# Patient Record
Sex: Male | Born: 1950 | Race: White | Hispanic: No | Marital: Married | Smoking: Former smoker
Health system: Southern US, Community
[De-identification: ages and names within clinical notes are randomized; demographics above are authoritative.]

## PROBLEM LIST (undated history)

## (undated) DIAGNOSIS — J45909 Unspecified asthma, uncomplicated: Secondary | ICD-10-CM

## (undated) DIAGNOSIS — N419 Inflammatory disease of prostate, unspecified: Secondary | ICD-10-CM

## (undated) DIAGNOSIS — Z889 Allergy status to unspecified drugs, medicaments and biological substances status: Secondary | ICD-10-CM

## (undated) DIAGNOSIS — E785 Hyperlipidemia, unspecified: Secondary | ICD-10-CM

## (undated) DIAGNOSIS — N400 Enlarged prostate without lower urinary tract symptoms: Secondary | ICD-10-CM

## (undated) DIAGNOSIS — K219 Gastro-esophageal reflux disease without esophagitis: Secondary | ICD-10-CM

## (undated) DIAGNOSIS — M1990 Unspecified osteoarthritis, unspecified site: Secondary | ICD-10-CM

## (undated) DIAGNOSIS — I10 Essential (primary) hypertension: Secondary | ICD-10-CM

## (undated) DIAGNOSIS — I2510 Atherosclerotic heart disease of native coronary artery without angina pectoris: Secondary | ICD-10-CM

## (undated) HISTORY — PX: COLONOSCOPY: SHX174

## (undated) HISTORY — PX: CORONARY STENT PLACEMENT: SHX1402

## (undated) HISTORY — PX: JOINT REPLACEMENT: SHX530

## (undated) HISTORY — PX: APPENDECTOMY: SHX54

## (undated) HISTORY — PX: WISDOM TOOTH EXTRACTION: SHX21

---

## 2011-08-03 ENCOUNTER — Other Ambulatory Visit: Payer: Self-pay | Admitting: Orthopedic Surgery

## 2011-08-03 ENCOUNTER — Ambulatory Visit (HOSPITAL_COMMUNITY)
Admission: RE | Admit: 2011-08-03 | Discharge: 2011-08-03 | Disposition: A | Discharge status: Home / Self Care | Payer: No Typology Code available for payment source | Source: Ambulatory Visit | Attending: Orthopedic Surgery | Admitting: Orthopedic Surgery

## 2011-08-03 ENCOUNTER — Other Ambulatory Visit (HOSPITAL_COMMUNITY): Payer: Self-pay | Admitting: Orthopedic Surgery

## 2011-08-03 ENCOUNTER — Encounter (HOSPITAL_COMMUNITY): Payer: No Typology Code available for payment source

## 2011-08-03 DIAGNOSIS — Z01818 Encounter for other preprocedural examination: Secondary | ICD-10-CM

## 2011-08-03 DIAGNOSIS — Z01812 Encounter for preprocedural laboratory examination: Secondary | ICD-10-CM | POA: Insufficient documentation

## 2011-08-03 DIAGNOSIS — Z01810 Encounter for preprocedural cardiovascular examination: Secondary | ICD-10-CM | POA: Insufficient documentation

## 2011-08-03 DIAGNOSIS — M87059 Idiopathic aseptic necrosis of unspecified femur: Secondary | ICD-10-CM | POA: Insufficient documentation

## 2011-08-03 DIAGNOSIS — I10 Essential (primary) hypertension: Secondary | ICD-10-CM | POA: Insufficient documentation

## 2011-08-03 DIAGNOSIS — Z01811 Encounter for preprocedural respiratory examination: Secondary | ICD-10-CM | POA: Insufficient documentation

## 2011-08-03 LAB — BASIC METABOLIC PANEL
CO2: 27 mEq/L (ref 19–32)
Calcium: 10.1 mg/dL (ref 8.4–10.5)
Creatinine, Ser: 0.99 mg/dL (ref 0.50–1.35)

## 2011-08-03 LAB — DIFFERENTIAL
Lymphocytes Relative: 34 % (ref 12–46)
Lymphs Abs: 1.3 10*3/uL (ref 0.7–4.0)
Neutrophils Relative %: 54 % (ref 43–77)

## 2011-08-03 LAB — URINE MICROSCOPIC-ADD ON

## 2011-08-03 LAB — CBC
Hemoglobin: 14.1 g/dL (ref 13.0–17.0)
Platelets: 206 10*3/uL (ref 150–400)
RBC: 4.25 MIL/uL (ref 4.22–5.81)
WBC: 3.9 10*3/uL — ABNORMAL LOW (ref 4.0–10.5)

## 2011-08-03 LAB — SURGICAL PCR SCREEN
MRSA, PCR: NEGATIVE
Staphylococcus aureus: NEGATIVE

## 2011-08-03 LAB — URINALYSIS, ROUTINE W REFLEX MICROSCOPIC
Glucose, UA: NEGATIVE mg/dL
Specific Gravity, Urine: 1.019 (ref 1.005–1.030)
pH: 5 (ref 5.0–8.0)

## 2011-08-06 NOTE — H&P (Signed)
Sean Schultz, Sean Schultz              ACCOUNT NO.:  [id number]  MEDICAL RECORD NO.:  [id number]  LOCATION:                                 FACILITY:  PHYSICIAN:  Madlyn Frankel. Charlann Boxer, M.D.  DATE OF BIRTH:  11/25/1950  DATE OF ADMISSION:  08/14/2011 DATE OF DISCHARGE:                             HISTORY & PHYSICAL   DATE OF SURGERY:  August 14, 2011.  ADMISSION DIAGNOSIS:  Avascular necrosis, right hip.  HISTORY OF PRESENT ILLNESS:  This is a 60 year old gentleman with a history of AVN with collapse of his right hip.  After discussion of treatment, benefits, risks and options for this, he is now scheduled for total hip arthroplasty by anterior approach.  Note that he is a candidate for tranexamic acid and will receive that at preop.  His medical doctor is Dr. Fabienne Bruns, and he will be going home after surgery.  PAST MEDICAL HISTORY:  Drug allergy to PENICILLIN with swelling and MULTIPLE FOOD ALLERGIES.  CURRENT MEDICATIONS: 1. Prevalite powder 1-1/2 scoops q.a.m. 2. Diovan 160 mg q.a.m. 3. Fenofibrate 160 mg q.a.m. 4. Crestor 5 mg twice a week in the p.m. 5. He takes herbal over-the-counter medicines in terms of ArteClear,     blood pressure, 1300 mg daily. 6. Folic acid 400 mg daily. 7. MegaRed Krill Oil 300 mg daily. 8. Kirkland Aller-Fex 180 mg daily. 9. Bayer Low Dose Aspirin 81 mg daily. 10.Biospec Cholesterol Complete 2 tablets daily. 11.Kirkland Fiber Tabs 4 tablets q.p.m. 12.Slo-Niacin 1000 mg q.p.m. 13.Benadryl 25 mg p.r.n. 14.Zyrtec 10 mg p.r.n. 15.Melatonin 5 mg p.r.n.  SERIOUS MEDICAL ILLNESSES:  Include: 1. Hypertension. 2. Hyperlipidemia.  PREVIOUS SURGERIES:  Include removal of the cyst from the knee.  FAMILY HISTORY:  Positive for hyperlipidemia, heart attack, stroke, pneumonia, and CHF.  SOCIAL HISTORY:  The patient is married.  He works in Pharmacist, community.  He does not smoke and does not drink.  He will be going home after surgery.  REVIEW OF SYSTEMS:   CENTRAL NERVOUS SYSTEM:  Negative for headache, blurred vision, or dizziness.  PULMONARY:  Negative for shortness of breath, PND, and orthopnea.  CARDIOVASCULAR:  Negative for chest pain or palpitation.  Positive for hyperlipidemia and hypertension.  GI: Negative for ulcers, hepatitis.  GU:  Negative for urinary tract difficulties other than a bout of BPH.  MUSCULOSKELETAL:  Positive in HPI.  PHYSICAL EXAMINATION:  VITAL SIGNS:  BP 136/84, respirations 16, pulse 72 and regular. GENERAL APPEARANCE:  This is a well-developed, well-nourished gentleman in no acute distress. HEENT:  Head normocephalic.  Nose patent.  Ears patent.  Pupils equal, round and reactive light.  Throat without injection. NECK:  Supple without adenopathy.  Carotids 2+ without bruit. CHEST:  Clear to auscultation.  No rales or rhonchi.  Respirations 16. HEART:  Regular rate and rhythm at 72 beats per minute without murmur. ABDOMEN:  Soft.  Active bowel sounds.  No masses, organomegaly. NEUROLOGIC:  The patient alert and oriented to time, place, and person. Cranial nerves II-XII grossly intact. EXTREMITIES:  Shows the right hip with decreased range of motion with pain.  Neurovascular status intact.  IMPRESSION:  Right hip avascular necrosis with collapse  and plan of action is total hip arthroplasty right hip by anterior approach.     Jaquelyn Bitter. Chabon, P.A.   ______________________________ Madlyn Frankel Charlann Boxer, M.D.    SJC/MEDQ  D:  08/01/2011  T:  08/01/2011  Job:  [id number]  Electronically Signed by Jodene Nam P.A. on 08/06/2011 07:34:58 AM Electronically Signed by Durene Romans M.D. on 08/06/2011 02:41:53 PM

## 2011-08-14 ENCOUNTER — Inpatient Hospital Stay (HOSPITAL_COMMUNITY): Payer: No Typology Code available for payment source

## 2011-08-14 ENCOUNTER — Inpatient Hospital Stay (HOSPITAL_COMMUNITY)
Admission: RE | Admit: 2011-08-14 | Discharge: 2011-08-16 | DRG: 470 | Disposition: A | Discharge status: Home Health | Payer: No Typology Code available for payment source | Source: Ambulatory Visit | Attending: Orthopedic Surgery | Admitting: Orthopedic Surgery

## 2011-08-14 DIAGNOSIS — I10 Essential (primary) hypertension: Secondary | ICD-10-CM | POA: Diagnosis present

## 2011-08-14 DIAGNOSIS — M8970 Major osseous defect, unspecified site: Secondary | ICD-10-CM | POA: Diagnosis present

## 2011-08-14 DIAGNOSIS — E785 Hyperlipidemia, unspecified: Secondary | ICD-10-CM | POA: Diagnosis present

## 2011-08-14 DIAGNOSIS — Z79899 Other long term (current) drug therapy: Secondary | ICD-10-CM

## 2011-08-14 DIAGNOSIS — Z7982 Long term (current) use of aspirin: Secondary | ICD-10-CM

## 2011-08-14 DIAGNOSIS — Z01812 Encounter for preprocedural laboratory examination: Secondary | ICD-10-CM

## 2011-08-14 DIAGNOSIS — Z880 Allergy status to penicillin: Secondary | ICD-10-CM

## 2011-08-14 DIAGNOSIS — Z01810 Encounter for preprocedural cardiovascular examination: Secondary | ICD-10-CM

## 2011-08-14 DIAGNOSIS — Z91018 Allergy to other foods: Secondary | ICD-10-CM

## 2011-08-14 DIAGNOSIS — M87059 Idiopathic aseptic necrosis of unspecified femur: Principal | ICD-10-CM | POA: Diagnosis present

## 2011-08-14 LAB — TYPE AND SCREEN
ABO/RH(D): O POS
Antibody Screen: NEGATIVE

## 2011-08-15 LAB — BASIC METABOLIC PANEL
GFR calc Af Amer: >90 mL/min (ref >90)
GFR calc non Af Amer: 87 mL/min — ABNORMAL LOW (ref >90)
Glucose, Bld: 96 mg/dL (ref 70–99)
Potassium: 3.7 mEq/L (ref 3.5–5.1)
Sodium: 138 mEq/L (ref 135–145)

## 2011-08-15 LAB — CBC
Hemoglobin: 10.3 g/dL — ABNORMAL LOW (ref 13.0–17.0)
MCHC: 33.8 g/dL (ref 30.0–36.0)

## 2011-08-16 LAB — CBC
Hemoglobin: 10.8 g/dL — ABNORMAL LOW (ref 13.0–17.0)
MCH: 32.3 pg (ref 26.0–34.0)
RBC: 3.34 MIL/uL — ABNORMAL LOW (ref 4.22–5.81)

## 2011-08-16 LAB — BASIC METABOLIC PANEL
CO2: 26 mEq/L (ref 19–32)
Chloride: 104 mEq/L (ref 96–112)
Glucose, Bld: 103 mg/dL — ABNORMAL HIGH (ref 70–99)
Potassium: 3.9 mEq/L (ref 3.5–5.1)
Sodium: 137 mEq/L (ref 135–145)

## 2011-08-16 NOTE — Op Note (Signed)
Sean Schultz, Sean Schultz              ACCOUNT NO.:  [id number]  MEDICAL RECORD NO.:  [id number]  LOCATION:  1611                         FACILITY:  [facility]  PHYSICIAN:  Madlyn Frankel. Charlann Boxer, M.D.  DATE OF BIRTH:  1950/12/17  DATE OF PROCEDURE:  08/14/2011 DATE OF DISCHARGE:                              OPERATIVE REPORT   PREOPERATIVE DIAGNOSIS:  Right hip avascular necrosis.  POSTOPERATIVE DIAGNOSIS:  Right hip avascular necrosis.  PROCEDURE:  Right total knee replacement.  COMPONENTS USED:  A DePuy hip system size 56 pinnacle shell, 36 +4 neutral all tracts liner, size 7 standard trial lock stem with 36 +1.5 Delta ceramic ball.  SURGEON:  Madlyn Frankel. Charlann Boxer, M.D.  ASSISTANT:  Lanney Gins, PA-C  ANESTHESIA:  General.  BLOOD LOSS:  400 cc.  DRAINS:  One Hemovac.  COMPLICATIONS:  None.  INDICATIONS FOR PROCEDURE:  Sean Schultz is a 60 year old gentleman who has been seen and evaluated for advanced right hip avascular necrosis with collapse.  He had failed conservative measures, had a significant amount of discomfort, has effected the overall quality of life.  At this point, he wished to proceed with more definitive measures.  Risks and benefits of the hip replacement were discussed including infection, DVT, component failure, need for revision surgery, as well as a discussion approach. At this point, consent was obtained for a right anterior hip replacement.  Consent obtained.  PROCEDURE IN DETAIL:  The patient was brought to the operative theater. Once adequate anesthesia, preoperative antibiotics, Cleocin administered, the patient was positioned supine on the [employer] table. Once bony prominences were padded and positioned the right arm cross to body, the right hip was pre draped and fluoroscopy was then used to confirm orientation of the pelvis and positioning.  The right hip was then prepped and draped in sterile fashion using shower curtain technique.  Time-out was  performed identifying the patient, planned procedure, and extremity.  An incision was made 2 cm distal and lateral to the anterior, superior, and iliac spine, and extending over the fascia of the tensor fascia lata muscle.  Soft tissue exposure was created.  The fascia was then incised, the muscles swept laterally and a retractor placed over the superior neck.  A second retractor was placed over the inferior neck.  The pericapsular fat and circumflex vessels were cauterized and excised.  With the capsule exposure with the anterior rectus elevated and retracted anteriorly, a capsulotomy was made over the superior neck extending into the trochanteric fossa and then down towards the lesser trochanter.  Stay sutures were placed and retractors were placed intracapsularly.  At this point, the neck osteotomy line was identified, confirmed in orientation radiographically.  The neck osteotomy was then made.  Femoral head was removed.  Severe avascular changes with collapse of femoral head and near-complete avulsion of the patient's cartilage off the head was identified.  At this point, retraction was taken off the femur.  Retractors were placed posterior and one anterior over the rim of the acetabulum, began reaming with a 46 reamer, reamed up to 55 reamer with good bony bed preparation.  I chose a 56 pinnacle cup, the final 56 pinnacle cup was then  impacted under fluoroscopic imaging confirming the orientation depth as seen.  Based on the initial stretch fit, I went ahead and placed a hole eliminator and then a 36 +4 neutral all tracts liner.  At this point, the lateral femoral hook was positioned.  The femur was rotated around 100 degrees, releasing some of the capsule along the inferior aspect of the neck.  It is then rolled back neutral, and the superior capsule released off the proximal for the joint.  The hip was then rolled back to 100 degrees, retractor placed medially and  laterally over the trochanter, and the hip extended and adducted.  After removing some of the posterior tissues in the trochanteric fossa area, I was able to use a starting box osteotome, open up the proximal femur, and then hand broached once.  I began broaching starting with a one broach, setting a version native to the patient.  I broached up to a size 6, where a size 7 broach, which sat and drained nicely within the medial lateral metaphysis.  Trial reduction was carried out with standard neck and a 36, 1.5 ball.  With this, I found that the hip was very stable.  Radiographically, we confirmed the position of the stem in AP and lateral planes as well as the AP pelvis, confirming the position of the hip.  Given these findings, the final components were opened, holding the final ceramic ball.  Trial components were removed, retractors were placed.  The final 7 stem was opened and impacted, and it sat at the level of the broach.  Based on the trial reduction as well as the position of the stem, the final 36 1.5 ball was chosen and impacted onto clean and dry trunnion, and the hip was reduced.  We had irrigated the hip throughout the case, and again at this point.  I removed the stay sutures and placed anterior capsular at top of the stem, placed a medium Hemovac drain deep.  The fascia of the tensor fascia lata muscle was then reapproximated using a #1 Vicryl.  The remaining of the wound was closed with 2-0 Vicryl and a running 4-0 Monocryl.  The hip was cleaned, dried, and dressed sterilely with Dermabond and Aquacel dressing.  Drain site dressed separately.  He was then brought to recovery room in stable condition tolerating the procedure well.     Madlyn Frankel Charlann Boxer, M.D.     MDO/MEDQ  D:  08/14/2011  T:  08/14/2011  Job:  [id number]  Electronically Signed by Durene Romans M.D. on 08/16/2011 04:54:09 AM

## 2011-08-21 NOTE — Discharge Summary (Signed)
NAMEDUSTINE, STICKLER              ACCOUNT NO.:  [id number]  MEDICAL RECORD NO.:  [id number]  LOCATION:  1611                         FACILITY:  [facility]  PHYSICIAN:  Madlyn Frankel. Charlann Boxer, M.D.  DATE OF BIRTH:  10/12/1951  DATE OF ADMISSION:  08/14/2011 DATE OF DISCHARGE:  08/16/2011                              DISCHARGE SUMMARY   PROCEDURE:  Right total hip arthroplasty, anterior approach.  ADMITTING DIAGNOSIS:  Avascular necrosis, right hip.  DISCHARGE DIAGNOSES: 1. Status post right total hip arthroplasty, anterior approach. 2. Hypertension. 3. Hyperlipidemia.  HOSPITAL COURSE:  The patient is a 60 year old gentleman with a history of AVN collapse of the right hip.  X-rays in the clinic did show these findings on x-ray.  Various treatment options have been tried, all of which have failed to alleviate the patient's symptoms.  Various options were discussed with the patient.  The patient wished to proceed with surgery.  Risks, benefits, and expectations of the procedure were discussed with the patient.  The patient understands the risks, benefits, and expectations and wishes to proceed with right total hip arthroplasty per Dr. Charlann Boxer.  HOSPITAL COURSE:  The patient underwent the above-stated procedure on July 23, 2011.  The patient tolerated the procedure well, was brought to the recovery room in good condition and subsequently to the floor.  Postop day #1, August 15, 2011, the patient is doing well, no events. Pain was well controlled.  Afebrile.  Vital signs stable.  Hemoglobin 10.3, hematocrit 30.5.  Dressing was good, clean, dry, and intact. Hemovac was draining.  He has physical therapy.  Postop day #2, August 16, 2011, the patient is doing really well, no events.  Pain was well-controlled.  He was afebrile.  Hemoglobin and hematocrit 10.8/32.0.  Dressing was good, clean, dry, and intact.  He was distally neurovascular intact.  The patient was doing well with physical  therapy.  It was felt the patient was doing well enough to be discharged home with home health PT after physical therapy.  DISCHARGE CONDITION:  Good.  DISCHARGE INSTRUCTIONS:  The patient will be discharged to home with home health PT after having physical therapy in the hospital.  The patient will be weightbearing as tolerated.  The patient maintained the surgical dressing for about 8 days, after at which time he will replace with gauze and tape.  The patient is to keep the area dry and clean until followup.  The patient will follow up in 2 weeks at [facility].  The patient is to call with any questions or concerns.  DISCHARGE MEDICATIONS: 1. Aspirin, enteric coated, 325 mg 1 p.o. b.i.d. for 4 weeks. 2. Colace 100 mg 1 p.o. b.i.d. constipation. 3. Iron sulfate 325 mg 1 p.o. t.i.d. for 2-3 weeks. 4. Norco 7.5/325, 1-2 p.o. q.4-6 h. p.r.n. pain. 5. Robaxin 500 mg 1 p.o. q.6 h. p.r.n. muscle spasms. 6. MiraLax 17 g 1 p.o. daily 7. Benadryl 25 mg 1 p.o. daily p.r.n. insomnia. 8. Crestor 5 mg 1 p.o. twice weekly. 9. Diovan 160 mg 1 p.o. q.a.m. 10.Fexofenadine 180 mg 1 p.o. daily. 11.Folic acid 0.4 mg 1 p.o. daily. 12.Melatonin 5 mg 1 p.o. q.h.s. p.r.n. 13.Slo-Niacin 500 mg 2  tablets p.o. q.h.s. 14.Zyrtec 10 mg 1 p.o. q.h.s. p.r.n. allergies.    ______________________________ Lanney Gins, PA   ______________________________ Madlyn Frankel. Charlann Boxer, M.D.    MB/MEDQ  D:  08/16/2011  T:  08/16/2011  Job:  [id number]  Electronically Signed by Durene Romans M.D. on 08/21/2011 04:11:34 PM

## 2012-07-21 IMAGING — CR DG HIP 1V PORT*R*
1 series · 1 of 1 positions shown · non-contrast
Comparison: None.

CLINICAL DATA: Right hip prosthesis placement.

PORTABLE RIGHT HIP - 1 VIEW

[AP]
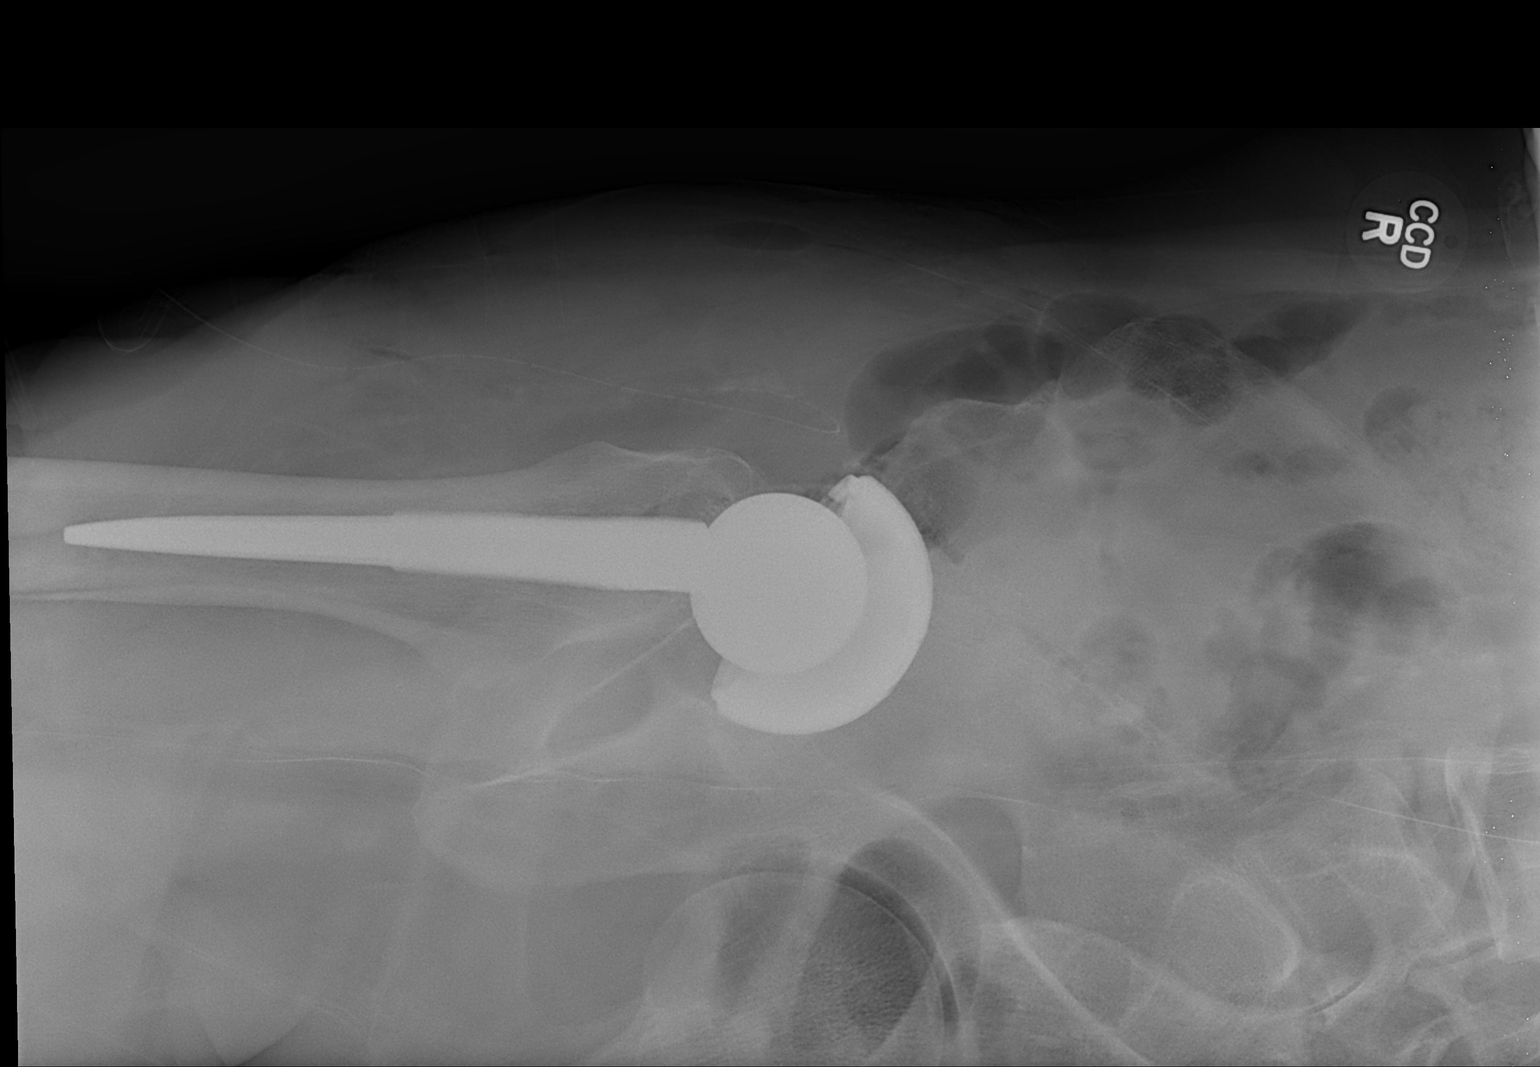

[1 of 1 positions shown; findings below may reference images not displayed]

FINDINGS: The cross-table lateral view of the right hip prosthesis
demonstrates no fracture or complicating feature along the stem or
acetabulum.
IMPRESSION: 1.  No radiographic findings of complication related to the right
hip implant.

## 2013-05-04 DIAGNOSIS — K5730 Diverticulosis of large intestine without perforation or abscess without bleeding: Secondary | ICD-10-CM | POA: Insufficient documentation

## 2013-05-04 DIAGNOSIS — K635 Polyp of colon: Secondary | ICD-10-CM | POA: Insufficient documentation

## 2014-03-19 DIAGNOSIS — E785 Hyperlipidemia, unspecified: Secondary | ICD-10-CM | POA: Insufficient documentation

## 2014-03-19 DIAGNOSIS — J309 Allergic rhinitis, unspecified: Secondary | ICD-10-CM | POA: Insufficient documentation

## 2014-03-19 DIAGNOSIS — N419 Inflammatory disease of prostate, unspecified: Secondary | ICD-10-CM | POA: Insufficient documentation

## 2014-03-19 DIAGNOSIS — N138 Other obstructive and reflux uropathy: Secondary | ICD-10-CM | POA: Insufficient documentation

## 2014-03-19 DIAGNOSIS — R350 Frequency of micturition: Secondary | ICD-10-CM | POA: Insufficient documentation

## 2014-03-22 DIAGNOSIS — N401 Enlarged prostate with lower urinary tract symptoms: Secondary | ICD-10-CM | POA: Insufficient documentation

## 2015-11-23 DIAGNOSIS — R7989 Other specified abnormal findings of blood chemistry: Secondary | ICD-10-CM | POA: Insufficient documentation

## 2016-06-19 DIAGNOSIS — K1230 Oral mucositis (ulcerative), unspecified: Secondary | ICD-10-CM | POA: Insufficient documentation

## 2016-07-09 DIAGNOSIS — H6991 Unspecified Eustachian tube disorder, right ear: Secondary | ICD-10-CM | POA: Insufficient documentation

## 2016-07-09 DIAGNOSIS — K219 Gastro-esophageal reflux disease without esophagitis: Secondary | ICD-10-CM | POA: Insufficient documentation

## 2016-07-14 ENCOUNTER — Emergency Department (HOSPITAL_COMMUNITY): Payer: No Typology Code available for payment source | Admitting: Certified Registered Nurse Anesthetist

## 2016-07-14 ENCOUNTER — Encounter (HOSPITAL_COMMUNITY): Admission: EM | Disposition: A | Discharge status: Home / Self Care | Payer: Self-pay | Source: Home / Self Care

## 2016-07-14 ENCOUNTER — Emergency Department (HOSPITAL_COMMUNITY): Payer: No Typology Code available for payment source

## 2016-07-14 ENCOUNTER — Encounter (HOSPITAL_COMMUNITY): Payer: Self-pay | Admitting: Emergency Medicine

## 2016-07-14 ENCOUNTER — Inpatient Hospital Stay (HOSPITAL_COMMUNITY)
Admission: EM | Admit: 2016-07-14 | Discharge: 2016-07-15 | DRG: 340 | Disposition: A | Discharge status: Home / Self Care | Payer: No Typology Code available for payment source | Attending: Surgery | Admitting: Surgery

## 2016-07-14 DIAGNOSIS — K5730 Diverticulosis of large intestine without perforation or abscess without bleeding: Secondary | ICD-10-CM | POA: Diagnosis present

## 2016-07-14 DIAGNOSIS — Z7982 Long term (current) use of aspirin: Secondary | ICD-10-CM

## 2016-07-14 DIAGNOSIS — Z96649 Presence of unspecified artificial hip joint: Secondary | ICD-10-CM | POA: Diagnosis present

## 2016-07-14 DIAGNOSIS — E781 Pure hyperglyceridemia: Secondary | ICD-10-CM | POA: Diagnosis present

## 2016-07-14 DIAGNOSIS — K5900 Constipation, unspecified: Secondary | ICD-10-CM | POA: Diagnosis present

## 2016-07-14 DIAGNOSIS — E785 Hyperlipidemia, unspecified: Secondary | ICD-10-CM | POA: Diagnosis present

## 2016-07-14 DIAGNOSIS — K352 Acute appendicitis with generalized peritonitis: Principal | ICD-10-CM | POA: Diagnosis present

## 2016-07-14 DIAGNOSIS — I10 Essential (primary) hypertension: Secondary | ICD-10-CM | POA: Diagnosis present

## 2016-07-14 DIAGNOSIS — R109 Unspecified abdominal pain: Secondary | ICD-10-CM | POA: Diagnosis present

## 2016-07-14 DIAGNOSIS — N400 Enlarged prostate without lower urinary tract symptoms: Secondary | ICD-10-CM | POA: Diagnosis present

## 2016-07-14 DIAGNOSIS — Z880 Allergy status to penicillin: Secondary | ICD-10-CM

## 2016-07-14 DIAGNOSIS — K219 Gastro-esophageal reflux disease without esophagitis: Secondary | ICD-10-CM | POA: Diagnosis present

## 2016-07-14 DIAGNOSIS — I2510 Atherosclerotic heart disease of native coronary artery without angina pectoris: Secondary | ICD-10-CM | POA: Diagnosis present

## 2016-07-14 DIAGNOSIS — K353 Acute appendicitis with localized peritonitis, without perforation or gangrene: Secondary | ICD-10-CM

## 2016-07-14 DIAGNOSIS — K37 Unspecified appendicitis: Secondary | ICD-10-CM | POA: Diagnosis present

## 2016-07-14 HISTORY — DX: Gastro-esophageal reflux disease without esophagitis: K21.9

## 2016-07-14 HISTORY — PX: LAPAROSCOPIC APPENDECTOMY: SHX408

## 2016-07-14 HISTORY — DX: Inflammatory disease of prostate, unspecified: N41.9

## 2016-07-14 HISTORY — DX: Hyperlipidemia, unspecified: E78.5

## 2016-07-14 HISTORY — DX: Benign prostatic hyperplasia without lower urinary tract symptoms: N40.0

## 2016-07-14 HISTORY — DX: Essential (primary) hypertension: I10

## 2016-07-14 LAB — CBC
HCT: 39 % (ref 39.0–52.0)
Hemoglobin: 13.6 g/dL (ref 13.0–17.0)
MCH: 32.8 pg (ref 26.0–34.0)
MCHC: 34.9 g/dL (ref 30.0–36.0)
MCV: 94 fL (ref 78.0–100.0)
Platelets: 127 10*3/uL — ABNORMAL LOW (ref 150–400)
RBC: 4.15 MIL/uL — ABNORMAL LOW (ref 4.22–5.81)
RDW: 13.6 % (ref 11.5–15.5)
WBC: 7 10*3/uL (ref 4.0–10.5)

## 2016-07-14 LAB — COMPREHENSIVE METABOLIC PANEL WITH GFR
ALT: 29 U/L (ref 17–63)
AST: 44 U/L — ABNORMAL HIGH (ref 15–41)
Albumin: 4.4 g/dL (ref 3.5–5.0)
Alkaline Phosphatase: 28 U/L — ABNORMAL LOW (ref 38–126)
Anion gap: 12 (ref 5–15)
BUN: 14 mg/dL (ref 6–20)
CO2: 21 mmol/L — ABNORMAL LOW (ref 22–32)
Calcium: 9 mg/dL (ref 8.9–10.3)
Chloride: 104 mmol/L (ref 101–111)
Creatinine, Ser: 1.22 mg/dL (ref 0.61–1.24)
GFR calc Af Amer: >60 mL/min
GFR calc non Af Amer: 60 mL/min — ABNORMAL LOW
Glucose, Bld: 110 mg/dL — ABNORMAL HIGH (ref 65–99)
Potassium: 3.6 mmol/L (ref 3.5–5.1)
Sodium: 137 mmol/L (ref 135–145)
Total Bilirubin: 1.2 mg/dL (ref 0.3–1.2)
Total Protein: 7.3 g/dL (ref 6.5–8.1)

## 2016-07-14 LAB — URINALYSIS, ROUTINE W REFLEX MICROSCOPIC
GLUCOSE, UA: NEGATIVE mg/dL
Hgb urine dipstick: NEGATIVE
Ketones, ur: NEGATIVE mg/dL
NITRITE: NEGATIVE
PROTEIN: NEGATIVE mg/dL
Specific Gravity, Urine: 1.017 (ref 1.005–1.030)
pH: 5.5 (ref 5.0–8.0)

## 2016-07-14 LAB — GLUCOSE, CAPILLARY: GLUCOSE-CAPILLARY: 130 mg/dL — AB (ref 65–99)

## 2016-07-14 LAB — URINE MICROSCOPIC-ADD ON: RBC / HPF: NONE SEEN RBC/hpf (ref 0–5)

## 2016-07-14 LAB — LIPASE, BLOOD: Lipase: 29 U/L (ref 11–51)

## 2016-07-14 SURGERY — APPENDECTOMY, LAPAROSCOPIC
Anesthesia: General | Site: Abdomen

## 2016-07-14 MED ORDER — ONDANSETRON HCL 4 MG/2ML IJ SOLN
INTRAMUSCULAR | Status: DC | PRN
  Administered 2016-07-14: 4 mg via INTRAVENOUS

## 2016-07-14 MED ORDER — ROCURONIUM BROMIDE 100 MG/10ML IV SOLN
INTRAVENOUS | Status: DC | PRN
  Administered 2016-07-14: 10 mg via INTRAVENOUS
  Administered 2016-07-14: 30 mg via INTRAVENOUS

## 2016-07-14 MED ORDER — PROPOFOL 10 MG/ML IV BOLUS
INTRAVENOUS | Status: AC
  Filled 2016-07-14: qty 20

## 2016-07-14 MED ORDER — HYDROCODONE-ACETAMINOPHEN 5-325 MG PO TABS: 1.0000 | ORAL_TABLET | ORAL | Status: DC | PRN

## 2016-07-14 MED ORDER — DEXTROSE 5 % IV SOLN
1.0000 g | Freq: Four times a day (QID) | INTRAVENOUS | Status: DC
  Administered 2016-07-14 – 2016-07-15 (×4): 1 g via INTRAVENOUS
  Filled 2016-07-14 (×5): qty 1

## 2016-07-14 MED ORDER — FENTANYL CITRATE (PF) 100 MCG/2ML IJ SOLN
INTRAMUSCULAR | Status: DC | PRN
  Administered 2016-07-14: 25 ug via INTRAVENOUS
  Administered 2016-07-14: 100 ug via INTRAVENOUS
  Administered 2016-07-14: 25 ug via INTRAVENOUS

## 2016-07-14 MED ORDER — MIDAZOLAM HCL 2 MG/2ML IJ SOLN
INTRAMUSCULAR | Status: AC
  Filled 2016-07-14: qty 2

## 2016-07-14 MED ORDER — LACTATED RINGERS IV SOLN
INTRAVENOUS | Status: DC
  Administered 2016-07-14: 09:00:00 via INTRAVENOUS

## 2016-07-14 MED ORDER — SUCCINYLCHOLINE CHLORIDE 20 MG/ML IJ SOLN
INTRAMUSCULAR | Status: DC | PRN
  Administered 2016-07-14: 100 mg via INTRAVENOUS

## 2016-07-14 MED ORDER — ONDANSETRON HCL 4 MG/2ML IJ SOLN
INTRAMUSCULAR | Status: AC
  Filled 2016-07-14: qty 2

## 2016-07-14 MED ORDER — BUPIVACAINE-EPINEPHRINE 0.25% -1:200000 IJ SOLN
INTRAMUSCULAR | Status: AC
  Filled 2016-07-14: qty 1

## 2016-07-14 MED ORDER — BUPIVACAINE-EPINEPHRINE 0.25% -1:200000 IJ SOLN
INTRAMUSCULAR | Status: DC | PRN
  Administered 2016-07-14: 30 mL

## 2016-07-14 MED ORDER — PHENYLEPHRINE HCL 10 MG/ML IJ SOLN
INTRAMUSCULAR | Status: DC | PRN
  Administered 2016-07-14: 40 ug via INTRAVENOUS
  Administered 2016-07-14: 60 ug via INTRAVENOUS

## 2016-07-14 MED ORDER — LIDOCAINE HCL (CARDIAC) 20 MG/ML IV SOLN
INTRAVENOUS | Status: DC | PRN
  Administered 2016-07-14: 60 mg via INTRAVENOUS

## 2016-07-14 MED ORDER — 0.9 % SODIUM CHLORIDE (POUR BTL) OPTIME
TOPICAL | Status: DC | PRN
  Administered 2016-07-14: 1000 mL

## 2016-07-14 MED ORDER — IOPAMIDOL (ISOVUE-300) INJECTION 61%: 15.0000 mL | Freq: Once | INTRAVENOUS | Status: DC | PRN

## 2016-07-14 MED ORDER — KCL IN DEXTROSE-NACL 20-5-0.45 MEQ/L-%-% IV SOLN
INTRAVENOUS | Status: DC
  Administered 2016-07-14 (×2): via INTRAVENOUS
  Filled 2016-07-14 (×3): qty 1000

## 2016-07-14 MED ORDER — HYDROMORPHONE HCL 1 MG/ML IJ SOLN: 0.5000 mg | Freq: Once | INTRAMUSCULAR | Status: DC

## 2016-07-14 MED ORDER — ONDANSETRON HCL 4 MG/2ML IJ SOLN: 4.0000 mg | Freq: Four times a day (QID) | INTRAMUSCULAR | Status: DC | PRN

## 2016-07-14 MED ORDER — IBUPROFEN 200 MG PO TABS
600.0000 mg | ORAL_TABLET | Freq: Four times a day (QID) | ORAL | Status: DC | PRN
  Administered 2016-07-14 – 2016-07-15 (×3): 600 mg via ORAL
  Filled 2016-07-14 (×3): qty 3

## 2016-07-14 MED ORDER — FENTANYL CITRATE (PF) 250 MCG/5ML IJ SOLN
INTRAMUSCULAR | Status: AC
  Filled 2016-07-14: qty 5

## 2016-07-14 MED ORDER — SUGAMMADEX SODIUM 200 MG/2ML IV SOLN
INTRAVENOUS | Status: DC | PRN
  Administered 2016-07-14: 150 mg via INTRAVENOUS

## 2016-07-14 MED ORDER — IOPAMIDOL (ISOVUE-300) INJECTION 61%
100.0000 mL | Freq: Once | INTRAVENOUS | Status: AC | PRN
  Administered 2016-07-14: 100 mL via INTRAVENOUS

## 2016-07-14 MED ORDER — MORPHINE SULFATE (PF) 10 MG/ML IV SOLN: 1.0000 mg | INTRAVENOUS | Status: DC | PRN

## 2016-07-14 MED ORDER — ROCURONIUM BROMIDE 10 MG/ML (PF) SYRINGE
PREFILLED_SYRINGE | INTRAVENOUS | Status: AC
  Filled 2016-07-14: qty 20

## 2016-07-14 MED ORDER — PROPOFOL 10 MG/ML IV BOLUS
INTRAVENOUS | Status: DC | PRN
  Administered 2016-07-14: 160 mg via INTRAVENOUS

## 2016-07-14 MED ORDER — LACTATED RINGERS IR SOLN
Status: DC | PRN
  Administered 2016-07-14: 1000 mL

## 2016-07-14 MED ORDER — DEXTROSE 5 % IV SOLN
2.0000 g | Freq: Two times a day (BID) | INTRAVENOUS | Status: DC
  Administered 2016-07-14: 2 g via INTRAVENOUS
  Filled 2016-07-14: qty 2

## 2016-07-14 MED ORDER — CEFOTETAN DISODIUM-DEXTROSE 2-2.08 GM-% IV SOLR
INTRAVENOUS | Status: AC
  Filled 2016-07-14: qty 50

## 2016-07-14 MED ORDER — HEPARIN SODIUM (PORCINE) 5000 UNIT/ML IJ SOLN
5000.0000 [IU] | Freq: Three times a day (TID) | INTRAMUSCULAR | Status: DC
  Administered 2016-07-14 – 2016-07-15 (×3): 5000 [IU] via SUBCUTANEOUS
  Filled 2016-07-14 (×3): qty 1

## 2016-07-14 MED ORDER — ONDANSETRON 4 MG PO TBDP: 4.0000 mg | ORAL_TABLET | Freq: Four times a day (QID) | ORAL | Status: DC | PRN

## 2016-07-14 MED ORDER — PNEUMOCOCCAL VAC POLYVALENT 25 MCG/0.5ML IJ INJ
0.5000 mL | INJECTION | INTRAMUSCULAR | Status: DC
  Filled 2016-07-14 (×2): qty 0.5

## 2016-07-14 MED ORDER — PHENYLEPHRINE 40 MCG/ML (10ML) SYRINGE FOR IV PUSH (FOR BLOOD PRESSURE SUPPORT)
PREFILLED_SYRINGE | INTRAVENOUS | Status: AC
  Filled 2016-07-14: qty 10

## 2016-07-14 MED ORDER — LACTATED RINGERS IV SOLN
INTRAVENOUS | Status: DC | PRN
  Administered 2016-07-14: 07:00:00 via INTRAVENOUS

## 2016-07-14 MED ORDER — IRBESARTAN 150 MG PO TABS: 75.0000 mg | ORAL_TABLET | Freq: Every day | ORAL | Status: DC

## 2016-07-14 MED ORDER — SUGAMMADEX SODIUM 200 MG/2ML IV SOLN
INTRAVENOUS | Status: AC
  Filled 2016-07-14: qty 2

## 2016-07-14 MED ORDER — FENTANYL CITRATE (PF) 100 MCG/2ML IJ SOLN: 25.0000 ug | INTRAMUSCULAR | Status: DC | PRN

## 2016-07-14 SURGICAL SUPPLY — 36 items
APPLIER CLIP ROT 10 11.4 M/L (STAPLE)
BENZOIN TINCTURE PRP APPL 2/3 (GAUZE/BANDAGES/DRESSINGS) IMPLANT
CABLE HIGH FREQUENCY MONO STRZ (ELECTRODE) ×3 IMPLANT
CHLORAPREP W/TINT 26ML (MISCELLANEOUS) ×3 IMPLANT
CLIP APPLIE ROT 10 11.4 M/L (STAPLE) IMPLANT
CLOSURE WOUND 1/2 X4 (GAUZE/BANDAGES/DRESSINGS)
COVER SURGICAL LIGHT HANDLE (MISCELLANEOUS) ×3 IMPLANT
CUTTER FLEX LINEAR 45M (STAPLE) ×3 IMPLANT
DECANTER SPIKE VIAL GLASS SM (MISCELLANEOUS) ×3 IMPLANT
DRAPE LAPAROSCOPIC ABDOMINAL (DRAPES) ×3 IMPLANT
ELECT REM PT RETURN 9FT ADLT (ELECTROSURGICAL) ×3
ELECTRODE REM PT RTRN 9FT ADLT (ELECTROSURGICAL) ×1 IMPLANT
ENDOLOOP SUT PDS II  0 18 (SUTURE)
ENDOLOOP SUT PDS II 0 18 (SUTURE) IMPLANT
GLOVE SURG SIGNA 7.5 PF LTX (GLOVE) ×3 IMPLANT
GOWN STRL REUS W/TWL XL LVL3 (GOWN DISPOSABLE) ×9 IMPLANT
IRRIG SUCT STRYKERFLOW 2 WTIP (MISCELLANEOUS) ×3
IRRIGATION SUCT STRKRFLW 2 WTP (MISCELLANEOUS) ×1 IMPLANT
KIT BASIN OR (CUSTOM PROCEDURE TRAY) ×3 IMPLANT
LIQUID BAND (GAUZE/BANDAGES/DRESSINGS) ×3 IMPLANT
POUCH SPECIMEN RETRIEVAL 10MM (ENDOMECHANICALS) ×3 IMPLANT
RELOAD 45 VASCULAR/THIN (ENDOMECHANICALS) IMPLANT
RELOAD STAPLE TA45 3.5 REG BLU (ENDOMECHANICALS) ×3 IMPLANT
SCISSORS LAP 5X35 DISP (ENDOMECHANICALS) ×3 IMPLANT
SHEARS HARMONIC ACE PLUS 36CM (ENDOMECHANICALS) ×3 IMPLANT
SLEEVE XCEL OPT CAN 5 100 (ENDOMECHANICALS) ×3 IMPLANT
STRIP CLOSURE SKIN 1/2X4 (GAUZE/BANDAGES/DRESSINGS) IMPLANT
SUT MNCRL AB 4-0 PS2 18 (SUTURE) ×3 IMPLANT
SUT VIC AB 2-0 SH 18 (SUTURE) IMPLANT
SUT VICRYL 0 UR6 27IN ABS (SUTURE) ×3 IMPLANT
TOWEL OR 17X26 10 PK STRL BLUE (TOWEL DISPOSABLE) ×3 IMPLANT
TOWEL OR NON WOVEN STRL DISP B (DISPOSABLE) ×3 IMPLANT
TRAY FOLEY W/METER SILVER 14FR (SET/KITS/TRAYS/PACK) IMPLANT
TRAY LAPAROSCOPIC (CUSTOM PROCEDURE TRAY) ×3 IMPLANT
TROCAR BLADELESS OPT 5 100 (ENDOMECHANICALS) ×3 IMPLANT
TROCAR XCEL BLUNT TIP 100MML (ENDOMECHANICALS) ×3 IMPLANT

## 2016-07-14 NOTE — Anesthesia Postprocedure Evaluation (Signed)
Anesthesia Post Note  Patient: Sean LoronGrover Schultz  Procedure(s) Performed: Procedure(s) (LRB): APPENDECTOMY LAPAROSCOPIC (N/A)  Patient location during evaluation: PACU Anesthesia Type: General Level of consciousness: sedated Pain management: satisfactory to patient Vital Signs Assessment: post-procedure vital signs reviewed and stable Respiratory status: spontaneous breathing Cardiovascular status: stable Anesthetic complications: no    Last Vitals:  Vitals:   07/14/16 1017 07/14/16 1130  BP: 107/63 117/70  Pulse: 84 90  Resp: 12 15  Temp: 37.3 C 36.9 C    Last Pain:  Vitals:   07/14/16 1130  TempSrc: Oral  PainSc:                  Jiles GarterJACKSON,Chelby Salata EDWARD

## 2016-07-14 NOTE — Op Note (Addendum)
Re:   Sean Schultz Jasinski DOB:   [birth year] MRN:   [id number]                   FACILITY:  [facility]  DATE OF PROCEDURE: 07/14/2016                              OPERATIVE REPORT  PREOPERATIVE DIAGNOSIS:  Appendicitis  POSTOPERATIVE DIAGNOSIS:  Acute perforated purulent appendicitis.  PROCEDURE:  Laparoscopic appendectomy.  SURGEON:  Sandria Balesavid H. Ezzard StandingNewman, MD  ASSISTANT:  No first assistant.  ANESTHESIA:  General endotracheal.  CRNA: Thornell MuleHoward G Stubblefield, CRNA  ASA:  2E  ESTIMATED BLOOD LOSS:  Minimal.  DRAINS: none   SPECIMEN:   Appendix  COUNTS CORRECT:  YES  INDICATIONS FOR PROCEDURE: Sean Schultz Cast is a 65 y.o. (DOB: [birth year]) white male whose primary care doctor is FUTRELL,THOMAS, MD and comes to the OR for an appendectomy.   I discussed with the patient, the indications and potential complications of appendiceal surgery.  The potential complications include, but are not limited to, bleeding, open surgery, bowel resection, and the possibility of another diagnosis.  OPERATIVE NOTE:  The patient underwent a general endotracheal anesthetic as supervised by CRNA: Thornell MuleHoward G Stubblefield, CRNA, General, [facility].  The patient was given 2 g of cefotetan at the beginning of the procedure and the abdomen was prepped with ChloraPrep.  He did not have a foley placed.  A time-out was held and surgical checklist run.  An infraumbilical incision was made with sharp dissection carried down to the abdominal cavity.  An 12 mm Hasson trocar was inserted through the infraumbilical incision and into the peritoneal cavity.  A 30 degree 5 mm laparoscope was inserted through a 12 mm Hasson trocar and the Hasson trocar secured with a 0 Vicryl suture.  I placed a 5 mm trocar in the right upper quadrant and 5 mm torcar in left lower quadrant and did abdominal exploration.    The right and left lobes of liver unremarkable.  Stomach was unremarkable.  The pelvic organs were unremarkable.  I saw no other  intra-abdominal abnormality.  The patient had appendicitis with the appendix located lateral to the right colon.  The appendix had perforated near the tip and there was purulence in the right colonic gutter.  The right colon was distended, which made it difficult to see over with the laparoscope.  The mesentery of the appendix was divided with a Harmonic scalpel.  I got to the base of the appendix.  I then used a blue load 45 mm Ethicon Endo-GIA stapler and fired this across the base of the appendix.  I placed the appendix in EndoCatch bag and delivered the bag through the umbilical incision.  I irrigated the abdomen with 2,000 cc of saline.  After irrigating the abdomen, I then removed the trocars, in turn.  The umbilical port fascia was closed with 0 Vicryl suture.   I closed the skin each site with a 4-0 Monocryl suture and painted the wounds with LiquidBand.  I then injected a total of 30 mL of 0.25% Marcaine at the incisions.  Sponge and needle count were correct at the end of the case.  The patient was transferred to the recovery room in good condition.  The patient tolerated the procedure well and it depends on the patient's post op clinical course as to when the patient could be discharged.  Alphonsa Overall, MD, [facility] Pager: [phone] Office phone:  [phone]

## 2016-07-14 NOTE — H&P (Signed)
Re:   [name] DOB:   03-12-1951 MRN:   [id number]   [facility] Admission note  ASSESSMENT AND PLAN: 1.  Appendicitis  I discussed with the patient the indications and risks of appendiceal surgery.  The primary risks of appendiceal surgery include, but are not limited to, bleeding, infection, bowel surgery, and open surgery.  There is also the risk that the patient may have continued symptoms after surgery.  We discussed the typical post-operative recovery course. I tried to answer the patient's questions.  2.  HTN 3.  GERD 4.  BPH 5.  CAD  Coronary calcs on CT scan 6.  Diverticulosis 7.  Recent broken tooth 8.  Hypertriglyceridemia   Chief Complaint  Patient presents with  . Abdominal Pain   REFERRING PHYSICIAN: [name], MD  HISTORY OF PRESENT ILLNESS: Sean Schultz is a 65 y.o. (DOB: 08/28/1951)  white male whose primary care physician is Sean Bale, MD Sean Schultz) and comes to the [facility] ER today for abdominal pain.  He has not been feeling well for about 2 or 3 weeks.  He wsa in [place] about a week ago and broke a tooth.  He saw his periodontist, Dr. Jordan Schultz in [facility].  He was given clindamycin. Then about 2 days ago, 8/31, he started having abdominal pain.  He has had no nausea, no vomiting, but felt constipated.  He went to an Urgent Care center last night in [facility] on [address]  They though he may have diverticulitis, gave him a prescription for antibiotics, but I don't think that he got that started.  Because of worsening pain, he came to the [facility] ER.  He does have some GERD.  He has no other GI history.  He has had no other abdominal surgery.  CT scan of abdomen - 1. Acute appendicitis, with dilatation of the appendix to 1.0 cm in diameter, mild wall enhancement and surrounding soft tissue inflammation. Trace associated free fluid noted.  2. Diffuse coronary artery calcifications seen.  3. Mild diverticulosis along the descending and sigmoid colon,  without evidence of diverticulitis. WBC - 07/14/2016 - 7,000    Past Medical History:  Diagnosis Date  . BPH (benign prostatic hypertrophy)   . GERD (gastroesophageal reflux disease)   . Hyperlipemia   . Hypertension   . Prostatitis      Past Surgical History:  Procedure Laterality Date  . COLONOSCOPY    . TOTAL HIP ARTHROPLASTY        Current Facility-Administered Medications  Medication Dose Route Frequency Provider Last Rate Last Dose  . HYDROmorphone (DILAUDID) injection 0.5 mg  0.5 mg Intravenous Once Sean Munch, MD       Current Outpatient Prescriptions  Medication Sig Dispense Refill  . aspirin EC 81 MG tablet Take 81 mg by mouth.    [name] azelastine (OPTIVAR) 0.05 % ophthalmic solution Place 1 drop into both eyes daily.   0  . clindamycin (CLEOCIN) 300 MG capsule Take 300 mg by mouth 4 (four) times daily.  0  . CONSTULOSE 10 GM/15ML solution Take 30 mLs by mouth 2 (two) times daily as needed for constipation.  0  . Cyanocobalamin (RA VITAMIN B-12 TR) 1000 MCG TBCR Take 1,000 mcg by mouth daily.     . fenofibrate 160 MG tablet Take 160 mg by mouth daily.  0  . Melatonin 5 MG TABS Take 5-10 mg by mouth at bedtime as needed (for sleep.).     [name] metroNIDAZOLE (FLAGYL) 500 MG tablet Take  500 mg by mouth 3 (three) times daily.  0  . montelukast (SINGULAIR) 10 MG tablet Take 10 mg by mouth daily.   1  . omeprazole (PRILOSEC) 40 MG capsule Take 40 mg by mouth daily.  0  . Plant Sterol Stanol-Pantethine (CHOLESTOFF COMPLETE) 300-100 MG CAPS Take 1 capsule by mouth daily.    . valsartan (DIOVAN) 160 MG tablet Take 160 mg by mouth daily.   1  . nystatin (MYCOSTATIN) 100000 UNIT/ML suspension Take 10-15 mLs by mouth 4 (four) times daily.   0      Allergies  Allergen Reactions  . Penicillins Swelling and Other (See Comments)    Dyspnea-childhood allergy Has patient had a PCN reaction causing immediate rash, facial/tongue/throat swelling, SOB or lightheadedness with  hypotension:unsure Has patient had a PCN reaction causing severe rash involving mucus membranes or skin necrosis:unsure Has patient had a PCN reaction that required hospitalization:unsure Has patient had a PCN reaction occurring within the last 10 years:No If all of the above answers are "NO", then may proceed with Cephalosporin use.      REVIEW OF SYSTEMS: Skin:  No history of rash.  No history of abnormal moles. Infection:  No history of hepatitis or HIV.  No history of MRSA. Neurologic:  No history of stroke.  No history of seizure.  No history of headaches. Cardiac:  HTN x 10 years.  Strong family history of heart disease. Pulmonary:  Quit smoking 30 years ago.  No chronic lung disease.  Endocrine:  No diabetes. No thyroid disease. Gastrointestinal:  See HPI Urologic:  No history of kidney stones.  No history of bladder infections. Musculoskeletal:  Right hip replaced - 2012 Sean Schultz- [place] Hematologic:  No bleeding disorder.  No history of anemia.  Not anticoagulated. Psycho-social:  The patient is oriented.   The patient has no obvious psychologic or social impairment to understanding our conversation and plan.  SOCIAL and FAMILY HISTORY: Married. 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  Plans to retire in 2018. Has 2 children:  40 and 2938 - one is paramedic and one a lawyer  PHYSICAL EXAM: BP 129/79 (BP Location: Left Arm)   Pulse 87   Temp 98 F (36.7 C) (Oral)   Resp 19   Ht 5' 6.75" (1.695 m)   Wt 77.1 kg (170 lb)   SpO2 98%   BMI 26.83 kg/m   General: Older WM who is alert and generally healthy appearing.  HEENT: Normal. Pupils equal. Neck: Supple. No mass.  No thyroid mass. Lymph Nodes:  No supraclavicular or cervical nodes. Lungs: Clear to auscultation and symmetric breath sounds. Heart:  RRR. No murmur or rub. Abdomen: Soft. Tender RLQ. Some guarding. Rectal: Not done. Extremities:  Good strength and ROM  in upper and lower extremities. Neurologic:   Grossly intact to motor and sensory function. Psychiatric: Has normal mood and affect. Behavior is normal.   DATA REVIEWED: Epic notes  Ovidio Kinavid Neftali Thurow, MD,  [facility] [address]  [address]   [place], [place]    [id number] Phone:  [phone] FAX:  [phone]

## 2016-07-14 NOTE — Anesthesia Preprocedure Evaluation (Addendum)
Anesthesia Evaluation  Patient identified by MRN, date of birth, ID band Patient awake    Reviewed: Allergy & Precautions, H&P , Patient's Chart, lab work & pertinent test results, reviewed documented beta blocker date and time   Airway Mallampati: II  TM Distance: >3 FB Neck ROM: full    Dental no notable dental hx.    Pulmonary    Pulmonary exam normal breath sounds clear to auscultation       Cardiovascular hypertension,  Rhythm:regular Rate:Normal     Neuro/Psych    GI/Hepatic GERD  Medicated,  Endo/Other    Renal/GU      Musculoskeletal   Abdominal   Peds  Hematology   Anesthesia Other Findings   Reproductive/Obstetrics                             Anesthesia Physical Anesthesia Plan  ASA: II and emergent  Anesthesia Plan: General   Post-op Pain Management:    Induction: Intravenous and Rapid sequence  Airway Management Planned: Oral ETT  Additional Equipment:   Intra-op Plan:   Post-operative Plan: Extubation in OR  Informed Consent: I have reviewed the patients History and Physical, chart, labs and discussed the procedure including the risks, benefits and alternatives for the proposed anesthesia with the patient or authorized representative who has indicated his/her understanding and acceptance.   Dental Advisory Given and Dental advisory given  Plan Discussed with: CRNA and Surgeon  Anesthesia Plan Comments: (  Discussed general anesthesia, including possible nausea, instrumentation of airway, sore throat,pulmonary aspiration, etc. I asked if the were any outstanding questions, or  concerns before we proceeded. )        Anesthesia Quick Evaluation

## 2016-07-14 NOTE — ED Provider Notes (Signed)
[facility]-EMERGENCY DEPT Provider Note   CSN: [id number] Arrival date & time: 07/14/16  [id number]  By signing my name below, I, Sean SalisburyJoshua Schultz, attest that this documentation has been prepared under the direction and in the presence of Sean Munchobert Bayan Kushnir, MD . Electronically Signed: Nelwyn SalisburyJoshua Schultz, Scribe. 07/14/2016. 3:33 AM.   History   Chief Complaint Chief Complaint  Patient presents with  . Abdominal Pain   The history is provided by the patient and the spouse. No language interpreter was used.     HPI Comments:  Sean LoronGrover Gauthier is a 65 y.o. male who presents to the Emergency Department complaining of sudden-onset worsening lower right abdominal pain onset yesterday. Pt reports that he was seen at urgent care yesterday for similar symptoms, where he was given laxatives with no relief. His pain is worsened on palpation with no alleviating factors indicated. He endorses associated fever and constipation.  Pt denies any confusion, vomiting or syncopal episodes.   Past Medical History:  Diagnosis Date  . BPH (benign prostatic hypertrophy)   . GERD (gastroesophageal reflux disease)   . Hyperlipemia   . Hypertension   . Prostatitis     There are no active problems to display for this patient.   Past Surgical History:  Procedure Laterality Date  . COLONOSCOPY    . TOTAL HIP ARTHROPLASTY         Home Medications    Prior to Admission medications   Not on File    Family History History reviewed. No pertinent family history.  Social History Social History  Substance Use Topics  . Smoking status: Never Smoker  . Smokeless tobacco: Never Used  . Alcohol use No     Allergies   Review of patient's allergies indicates no known allergies.   Review of Systems Review of Systems  Constitutional:       Per HPI, otherwise negative  HENT:       Per HPI, otherwise negative  Respiratory:       Per HPI, otherwise negative  Cardiovascular:       Per HPI, otherwise negative    Gastrointestinal: Negative for vomiting.  Endocrine:       Negative aside from HPI  Genitourinary:       Neg aside from HPI   Musculoskeletal:       Per HPI, otherwise negative  Skin: Negative.   Neurological: Negative for syncope.     Physical Exam Updated Vital Signs BP 129/79 (BP Location: Left Arm)   Pulse 87   Temp 98 F (36.7 C) (Oral)   Resp 19   Ht 5' 6.75" (1.695 m)   Wt 170 lb (77.1 kg)   SpO2 98%   BMI 26.83 kg/m   Physical Exam  Constitutional: He is oriented to person, place, and time. He appears well-developed. No distress.  HENT:  Head: Normocephalic and atraumatic.  Eyes: Conjunctivae and EOM are normal.  Cardiovascular: Normal rate and regular rhythm.   Pulmonary/Chest: Effort normal. No stridor. No respiratory distress.  Abdominal: He exhibits no distension.  Musculoskeletal: He exhibits no edema.  Neurological: He is alert and oriented to person, place, and time.  Skin: Skin is warm and dry.  Psychiatric: He has a normal mood and affect.  Nursing note and vitals reviewed.    ED Treatments / Results  DIAGNOSTIC STUDIES:  Oxygen Saturation is 98% on RA, normal by my interpretation.    COORDINATION OF CARE:  3:33 AM Discussed treatment plan with pt at bedside which included  imaging and pt agreed to plan.  Labs (all labs ordered are listed, but only abnormal results are displayed) Labs Reviewed  COMPREHENSIVE METABOLIC PANEL - Abnormal; Notable for the following:       Result Value   CO2 21 (*)    Glucose, Bld 110 (*)    AST 44 (*)    Alkaline Phosphatase 28 (*)    GFR calc non Af Amer 60 (*)    All other components within normal limits  CBC - Abnormal; Notable for the following:    RBC 4.15 (*)    Platelets 127 (*)    All other components within normal limits  URINALYSIS, ROUTINE W REFLEX MICROSCOPIC (NOT AT [facility]) - Abnormal; Notable for the following:    Color, Urine AMBER (*)    APPearance CLOUDY (*)    Bilirubin Urine SMALL (*)     Leukocytes, UA SMALL (*)    All other components within normal limits  URINE MICROSCOPIC-ADD ON - Abnormal; Notable for the following:    Squamous Epithelial / LPF 0-5 (*)    Bacteria, UA RARE (*)    All other components within normal limits  LIPASE, BLOOD     Radiology  I discussed the radiographic findings consistent with acute appendicitis with our radiologist, subsequently with our surgeon on-call.   Procedures Procedures (including critical care time)  Medications Ordered in ED Medications  HYDROmorphone (DILAUDID) injection 0.5 mg (not administered)  iopamidol (ISOVUE-300) 61 % injection 100 mL (100 mLs Intravenous Contrast Given 07/14/16 0449)     Initial Impression / Assessment and Plan / ED Course  I have reviewed the triage vital signs and the nursing notes.  Pertinent labs & imaging results that were available during my care of the patient were reviewed by me and considered in my medical decision making (see chart for details).  Clinical Course    Update: Patient not requesting pain medication. I discussed CT findings with the patient and his wife.  Patient presents with right lower quadrant abdominal pain, is found to have CT evidence of acute appendicitis, consistent with the patient's description of his symptoms. After discussion with our surgical colleagues, the patient was admitted for further evaluation, management. Final Clinical Impressions(s) / ED Diagnoses   I personally performed the services described in this documentation, which was scribed in my presence. The recorded information has been reviewed and is accurate.       Sean Munch, MD 07/14/16 [id number]

## 2016-07-14 NOTE — Anesthesia Procedure Notes (Signed)
Procedure Name: Intubation Date/Time: 07/14/2016 6:56 AM Performed by: Thornell MuleSTUBBLEFIELD, Halton Neas G Pre-anesthesia Checklist: Patient identified, Emergency Drugs available, Suction available and Patient being monitored Patient Re-evaluated:Patient Re-evaluated prior to inductionOxygen Delivery Method: Circle system utilized Preoxygenation: Pre-oxygenation with 100% oxygen Intubation Type: IV induction Ventilation: Mask ventilation without difficulty Laryngoscope Size: Miller and 3 Grade View: Grade I Tube type: Oral Tube size: 7.5 mm Number of attempts: 1 Airway Equipment and Method: Stylet and Oral airway Placement Confirmation: ETT inserted through vocal cords under direct vision,  positive ETCO2 and breath sounds checked- equal and bilateral Secured at: 21 cm Tube secured with: Tape Dental Injury: Teeth and Oropharynx as per pre-operative assessment

## 2016-07-14 NOTE — ED Triage Notes (Signed)
Pt c/o RLQ pain onset yesterday, pt states he had tooth repair Thursday and feel the anesthesia may have caused some constipation. Last BM Wednesday, denies n/v. Fever up to 102 at home, seen at urgent care yesterday, pain continues to increase this evening. Denies urinary s/s

## 2016-07-14 NOTE — Transfer of Care (Signed)
Immediate Anesthesia Transfer of Care Note  Patient: Sean Schultz  Procedure(s) Performed: Procedure(s): APPENDECTOMY LAPAROSCOPIC (N/A)  Patient Location: PACU  Anesthesia Type:General  Level of Consciousness: awake, alert  and oriented  Airway & Oxygen Therapy: Patient Spontanous Breathing and Patient connected to face mask oxygen  Post-op Assessment: Report given to RN and Post -op Vital signs reviewed and stable  Post vital signs: Reviewed and stable  Last Vitals:  Vitals:   07/14/16 0318 07/14/16 0618  BP: 129/79 128/72  Pulse: 87 86  Resp: 19 16  Temp: 36.7 C 36.8 C    Last Pain:  Vitals:   07/14/16 0618  TempSrc: Oral  PainSc: 8       Patients Stated Pain Goal: 2 (07/14/16 0618)  Complications: No apparent anesthesia complications

## 2016-07-15 MED ORDER — HYDROCODONE-ACETAMINOPHEN 5-325 MG PO TABS: 1.0000 | ORAL_TABLET | Freq: Four times a day (QID) | ORAL | 0 refills | Status: DC | PRN

## 2016-07-15 NOTE — Discharge Instructions (Signed)
[facility] - DISCHARGE INSTRUCTIONS TO PATIENT  Activity:  Driving - 2 to 4 days, if doing well   Lifting - No lifting more than 15 pounds for 7 days, then no limit  Wound Care:   May shower tomorrow  Diet:  As tolerated      Drink plenty of fluid  Follow up appointment:  Call Dr. Allene PyoNewman's office [facility] at [phone] for an appointment in 2 to 3 weeks.  Medications and dosages:  Resume your home medications.  You have a prescription for:  Vicodin and Septra.  Call Dr. Ezzard StandingNewman or his office  [phone]) if you have:  Temperature greater than 100.4,  Persistent nausea and vomiting,  Severe uncontrolled pain,  Redness, tenderness, or signs of infection (pain, swelling, redness, odor or green/yellow discharge around the site),  Any other questions or concerns you may have after discharge.  In an emergency, call 911 or go to an Emergency Department at a nearby hospital.

## 2016-07-15 NOTE — Progress Notes (Signed)
Discharge instructions gone over with pt and wife by both nurse and MD. All questions answered. Pt to follow up in 2-3 weeks at [facility]

## 2016-07-15 NOTE — Discharge Summary (Signed)
Physician Discharge Summary  Patient ID:  Sean Schultz  MRN: [id number]  DOB/AGE: 67/15/1952 67 y.o.  Admit date: 07/14/2016 Discharge date: 07/15/2016  Discharge Diagnoses:  1.  Perforated appendicitis   2.  HTN 3.  GERD 4.  BPH 5.  CAD                       Coronary calcs on CT scan 6.  Diverticulosis 7.  Recent broken tooth 8.  Hypertriglyceridemia   Active Problems:   Appendicitis  Operation: Procedure(s): APPENDECTOMY LAPAROSCOPIC on 07/14/2016 - D. Ezzard Standing  Discharged Condition: good  Hospital Course: Sean Schultz is an 65 y.o. male whose primary care physician is Sean Bale, MD and who was admitted 07/14/2016 with a chief complaint of  Chief Complaint  Patient presents with  . Abdominal Pain  .   He was brought to the operating room on 07/14/2016 and underwent  APPENDECTOMY LAPAROSCOPIC.  He is now one day post op.  He is taking po's well and is sore from surgery. He is ready to go home.  His wife is at his bedside.   The discharge instructions were reviewed with the patient.  Consults: None  Significant Diagnostic Studies: Results for orders placed or performed during the hospital encounter of 07/14/16  Lipase, blood  Result Value Ref Range   Lipase 29 11 - 51 U/L  Comprehensive metabolic panel  Result Value Ref Range   Sodium 137 135 - 145 mmol/L   Potassium 3.6 3.5 - 5.1 mmol/L   Chloride 104 101 - 111 mmol/L   CO2 21 (L) 22 - 32 mmol/L   Glucose, Bld 110 (H) 65 - 99 mg/dL   BUN 14 6 - 20 mg/dL   Creatinine, Ser 1.19 0.61 - 1.24 mg/dL   Calcium 9.0 8.9 - 14.7 mg/dL   Total Protein 7.3 6.5 - 8.1 g/dL   Albumin 4.4 3.5 - 5.0 g/dL   AST 44 (H) 15 - 41 U/L   ALT 29 17 - 63 U/L   Alkaline Phosphatase 28 (L) 38 - 126 U/L   Total Bilirubin 1.2 0.3 - 1.2 mg/dL   GFR calc non Af Amer 60 (L) >60 mL/min   GFR calc Af Amer >60 >60 mL/min   Anion gap 12 5 - 15  CBC  Result Value Ref Range   WBC 7.0 4.0 - 10.5 K/uL   RBC 4.15 (L) 4.22 - 5.81 MIL/uL    Hemoglobin 13.6 13.0 - 17.0 g/dL   HCT 82.9 56.2 - 13.0 %   MCV 94.0 78.0 - 100.0 fL   MCH 32.8 26.0 - 34.0 pg   MCHC 34.9 30.0 - 36.0 g/dL   RDW 86.5 78.4 - 69.6 %   Platelets 127 (L) 150 - 400 K/uL  Urinalysis, Routine w reflex microscopic  Result Value Ref Range   Color, Urine AMBER (A) YELLOW   APPearance CLOUDY (A) CLEAR   Specific Gravity, Urine 1.017 1.005 - 1.030   pH 5.5 5.0 - 8.0   Glucose, UA NEGATIVE NEGATIVE mg/dL   Hgb urine dipstick NEGATIVE NEGATIVE   Bilirubin Urine SMALL (A) NEGATIVE   Ketones, ur NEGATIVE NEGATIVE mg/dL   Protein, ur NEGATIVE NEGATIVE mg/dL   Nitrite NEGATIVE NEGATIVE   Leukocytes, UA SMALL (A) NEGATIVE  Urine microscopic-add on  Result Value Ref Range   Squamous Epithelial / LPF 0-5 (A) NONE SEEN   WBC, UA 0-5 0 - 5 WBC/hpf   RBC / HPF NONE SEEN  0 - 5 RBC/hpf   Bacteria, UA RARE (A) NONE SEEN   Urine-Other MUCOUS PRESENT   Glucose, capillary  Result Value Ref Range   Glucose-Capillary 130 (H) 65 - 99 mg/dL    Ct Abdomen Pelvis W Contrast  Result Date: 07/14/2016 CLINICAL DATA:  Acute onset of worsening right lower quadrant abdominal pain, fever and constipation. Initial encounter. EXAM: CT ABDOMEN AND PELVIS WITH CONTRAST TECHNIQUE: Multidetector CT imaging of the abdomen and pelvis was performed using the standard protocol following bolus administration of intravenous contrast. CONTRAST:  ISOVUE-300 IOPAMIDOL (ISOVUE-300) INJECTION 61% COMPARISON:  Pelvic radiograph performed 08/14/2011 FINDINGS: Minimal bibasilar atelectasis is noted. Diffuse coronary artery calcifications are seen. The liver and spleen are unremarkable in appearance. The gallbladder is within normal limits. The pancreas and adrenal glands are unremarkable. The kidneys are unremarkable in appearance. There is no evidence of hydronephrosis. No renal or ureteral stones are seen. Mild nonspecific perinephric stranding is noted bilaterally. The small bowel is unremarkable in  appearance. The stomach is within normal limits. No acute vascular abnormalities are seen. The appendix is dilated to 1.0 cm in diameter, with mild wall enhancement and surrounding soft inflammation, compatible with mild acute appendicitis. Trace associated free fluid is noted. Mild diverticulosis is noted along the descending and sigmoid colon, without evidence of diverticulitis. The bladder is mildly distended and grossly unremarkable. The prostate remains normal in size. No inguinal lymphadenopathy is seen. No acute osseous abnormalities are identified. A right hip arthroplasty is grossly unremarkable in appearance, though incompletely imaged. IMPRESSION: 1. Acute appendicitis, with dilatation of the appendix to 1.0 cm in diameter, mild wall enhancement and surrounding soft tissue inflammation. Trace associated free fluid noted. No evidence of perforation or abscess formation at this time. 2. Diffuse coronary artery calcifications seen. 3. Mild diverticulosis along the descending and sigmoid colon, without evidence of diverticulitis. These results were called by telephone at the time of interpretation on 07/14/2016 at 5:12 am to Dr. [name] , who verbally acknowledged these results. Electronically Signed   By: Sean Schultz M.D.   On: 07/14/2016 05:27    Discharge Exam:  Vitals:   07/15/16 0210 07/15/16 0625  BP: 112/78 104/66  Pulse: 73 68  Resp: 16 16  Temp: 98.2 F (36.8 C) 98.3 F (36.8 C)    General: WN older WM who is alert and generally healthy appearing.  Lungs: Clear to auscultation and symmetric breath sounds. Heart:  RRR. No murmur or rub. Abdomen: Soft. No mass. Incisions look good.  He has bowel sounds.   Discharge Medications:     Medication List    STOP taking these medications   clindamycin 300 MG capsule Commonly known as:  CLEOCIN   metroNIDAZOLE 500 MG tablet Commonly known as:  FLAGYL     TAKE these medications   aspirin EC 81 MG tablet Take 81 mg by  mouth.   azelastine 0.05 % ophthalmic solution Commonly known as:  OPTIVAR Place 1 drop into both eyes daily.   CHOLESTOFF COMPLETE 300-100 MG Caps Generic drug:  Plant Sterol Stanol-Pantethine Take 1 capsule by mouth daily.   CONSTULOSE 10 GM/15ML solution Generic drug:  lactulose Take 30 mLs by mouth 2 (two) times daily as needed for constipation.   fenofibrate 160 MG tablet Take 160 mg by mouth daily.   HYDROcodone-acetaminophen 5-325 MG tablet Commonly known as:  NORCO/VICODIN Take 1-2 tablets by mouth every 6 (six) hours as needed.   Melatonin 5 MG Tabs Take 5-10 mg  by mouth at bedtime as needed (for sleep.).   montelukast 10 MG tablet Commonly known as:  SINGULAIR Take 10 mg by mouth daily.   nystatin 100000 UNIT/ML suspension Commonly known as:  MYCOSTATIN Take 10-15 mLs by mouth 4 (four) times daily.   omeprazole 40 MG capsule Commonly known as:  PRILOSEC Take 40 mg by mouth daily.   RA VITAMIN B-12 TR 1000 MCG Tbcr Generic drug:  Cyanocobalamin Take 1,000 mcg by mouth daily.   valsartan 160 MG tablet Commonly known as:  DIOVAN Take 160 mg by mouth daily.       Disposition: 06-Home-Health Care Svc  Discharge Instructions    Diet - low sodium heart healthy    Complete by:  As directed   Increase activity slowly    Complete by:  As directed       Activity:  Driving - 2 to 4 days, if doing well   Lifting - No lifting more than 15 pounds for 7 days, then no limit  Wound Care:   May shower tomorrow  Diet:  As tolerated      Drink plenty of fluid  Follow up appointment:  Call Dr. Allene PyoNewman's office [facility]) at [phone] for an appointment in 2 to 3 weeks.  Medications and dosages:  Resume your home medications.  You have a prescription for:  Vicodin and Septra.   Signed: Ovidio Kinavid Marycruz Boehner, M.D., [facility] Office:  [phone]  07/15/2016, 8:59 AM

## 2016-07-17 ENCOUNTER — Encounter (HOSPITAL_COMMUNITY): Payer: Self-pay | Admitting: Surgery

## 2016-09-04 ENCOUNTER — Other Ambulatory Visit: Payer: Self-pay | Admitting: Cardiology

## 2016-09-04 DIAGNOSIS — I2510 Atherosclerotic heart disease of native coronary artery without angina pectoris: Secondary | ICD-10-CM

## 2016-09-14 ENCOUNTER — Encounter (HOSPITAL_COMMUNITY)
Admission: RE | Admit: 2016-09-14 | Discharge: 2016-09-14 | Disposition: A | Discharge status: Home / Self Care | Payer: No Typology Code available for payment source | Source: Ambulatory Visit | Attending: Cardiology | Admitting: Cardiology

## 2016-09-14 ENCOUNTER — Telehealth: Payer: Self-pay

## 2016-09-14 DIAGNOSIS — I2510 Atherosclerotic heart disease of native coronary artery without angina pectoris: Secondary | ICD-10-CM | POA: Insufficient documentation

## 2016-09-14 HISTORY — PX: CARDIOVASCULAR STRESS TEST: SHX262

## 2016-09-14 LAB — NM MYOCAR MULTI W/SPECT W/WALL MOTION / EF
CHL CUP MPHR: 153 {beats}/min
CHL CUP RESTING HR STRESS: 84 {beats}/min
CSEPEDS: 15 s
CSEPEW: 7 METS
CSEPHR: 92 %
CSEPPHR: 142 {beats}/min
Exercise duration (min): 5 min
RPE: 16

## 2016-09-14 MED ORDER — TECHNETIUM TC 99M TETROFOSMIN IV KIT
10.0000 | PACK | Freq: Once | INTRAVENOUS | Status: AC | PRN
  Administered 2016-09-14: 10 via INTRAVENOUS

## 2016-09-14 MED ORDER — TECHNETIUM TC 99M TETROFOSMIN IV KIT
30.0000 | PACK | Freq: Once | INTRAVENOUS | Status: AC | PRN
  Administered 2016-09-14: 30 via INTRAVENOUS

## 2016-09-14 NOTE — Progress Notes (Signed)
Patient presented for Lexiscan. Tolerated procedure well. Pending final stress imaging result. Hypertensive at rest. Seen for Dr. Donnie Ahoilley.

## 2016-09-14 NOTE — Telephone Encounter (Addendum)
09/14/16 1445 After speaking with Dr Delton SeeNelson appointment scheduled with Dr Clifton JamesMcAlhany Monday September 17, 2016 at 3:30, wife aware to arrive at 3:15.  Directions to office provided.  Per Dr York Spanielilley's office prior authorization is not required for outpatient procedures per [facility]. Medical records requested.    09/14/16 14:15 Discussed with Dr Delton SeeNelson and her nurse.  Per conversation Dr York Spanielilley's office will schedule cardiac cath as they normally do, except [facility].  Explained instructions to [facility] as received from Dr Delton SeeNelson.  Jim Likeeri Victorino Fatzinger MHA RN CCM

## 2016-09-17 ENCOUNTER — Ambulatory Visit (INDEPENDENT_AMBULATORY_CARE_PROVIDER_SITE_OTHER): Payer: No Typology Code available for payment source | Admitting: Cardiovascular Disease

## 2016-09-17 ENCOUNTER — Encounter: Payer: Self-pay | Admitting: *Deleted

## 2016-09-17 ENCOUNTER — Encounter: Payer: Self-pay | Admitting: Cardiovascular Disease

## 2016-09-17 ENCOUNTER — Encounter (INDEPENDENT_AMBULATORY_CARE_PROVIDER_SITE_OTHER): Payer: Self-pay

## 2016-09-17 VITALS — BP 140/82 | HR 78 | Ht 66.5 in | Wt 173.4 lb

## 2016-09-17 DIAGNOSIS — I2510 Atherosclerotic heart disease of native coronary artery without angina pectoris: Secondary | ICD-10-CM

## 2016-09-17 DIAGNOSIS — R9439 Abnormal result of other cardiovascular function study: Secondary | ICD-10-CM | POA: Diagnosis not present

## 2016-09-17 LAB — CBC WITH DIFFERENTIAL/PLATELET
BASOS ABS: 55 {cells}/uL (ref 0–200)
Basophils Relative: 1 %
EOS ABS: 0 {cells}/uL — AB (ref 15–500)
Eosinophils Relative: 0 %
HCT: 40.1 % (ref 38.5–50.0)
Hemoglobin: 13.5 g/dL (ref 13.2–17.1)
LYMPHS PCT: 36 %
Lymphs Abs: 1980 cells/uL (ref 850–3900)
MCH: 32.2 pg (ref 27.0–33.0)
MCHC: 33.7 g/dL (ref 32.0–36.0)
MCV: 95.7 fL (ref 80.0–100.0)
MONOS PCT: 10 %
MPV: 9.4 fL (ref 7.5–12.5)
Monocytes Absolute: 550 cells/uL (ref 200–950)
NEUTROS ABS: 2915 {cells}/uL (ref 1500–7800)
NEUTROS PCT: 53 %
PLATELETS: 192 10*3/uL (ref 140–400)
RBC: 4.19 MIL/uL — ABNORMAL LOW (ref 4.20–5.80)
RDW: 13.5 % (ref 11.0–15.0)
WBC: 5.5 10*3/uL (ref 3.8–10.8)

## 2016-09-17 NOTE — Patient Instructions (Addendum)
Medication Instructions:  Your physician recommends that you continue on your current medications as directed. Please refer to the Current Medication list given to you today.   Labwork: Lab work to be done today--BMP, CBC, PT  Testing/Procedures: Your physician has requested that you have a cardiac catheterization. Cardiac catheterization is used to diagnose and/or treat various heart conditions. Doctors may recommend this procedure for a number of different reasons. The most common reason is to evaluate chest pain. Chest pain can be a symptom of coronary artery disease (CAD), and cardiac catheterization can show whether plaque is narrowing or blocking your heart's arteries. This procedure is also used to evaluate the valves, as well as measure the blood flow and oxygen levels in different parts of your heart. For further information please visit [email or web address]. Please follow instruction sheet, as given. Scheduled for November 10,2017   Follow-Up: Your physician recommends that you schedule a follow-up appointment in: 3-4 weeks with Dr. Arvilla MarketMcAlhany--Scheduled for December 8,2017 at 9:30    Any Other Special Instructions Will Be Listed Below (If Applicable).   Coronary Angiogram A coronary angiogram, also called coronary angiography, is an X-ray procedure used to look at the arteries in the heart. In this procedure, a dye (contrast dye) is injected through a long, hollow tube (catheter). The catheter is about the size of a piece of cooked spaghetti and is inserted through your groin, wrist, or arm. The dye is injected into each artery, and X-rays are then taken to show if there is a blockage in the arteries of your heart. LET [facility] PROVIDER KNOW ABOUT:  Any allergies you have, including allergies to shellfish or contrast dye.   All medicines you are taking, including vitamins, herbs, eye drops, creams, and over-the-counter medicines.   Previous problems you or members of  your family have had with the use of anesthetics.   Any blood disorders you have.   Previous surgeries you have had.  History of kidney problems or failure.   Other medical conditions you have. RISKS AND COMPLICATIONS  Generally, a coronary angiogram is a safe procedure. However, problems can occur and include:  Allergic reaction to the dye.  Bleeding from the access site or other locations.  Kidney injury, especially in people with impaired kidney function.  Stroke (rare).  Heart attack (rare). BEFORE THE PROCEDURE   Do not eat or drink anything after midnight the night before the procedure or as directed by your health care provider.   Ask your health care provider about changing or stopping your regular medicines. This is especially important if you are taking diabetes medicines or blood thinners. PROCEDURE  You may be given a medicine to help you relax (sedative) before the procedure. This medicine is given through an intravenous (IV) access tube that is inserted into one of your veins.   The area where the catheter will be inserted will be washed and shaved. This is usually done in the groin but may be done in the fold of your arm (near your elbow) or in the wrist.   A medicine will be given to numb the area where the catheter will be inserted (local anesthetic).   The health care provider will insert the catheter into an artery. The catheter will be guided by using a special type of X-ray (fluoroscopy) of the blood vessel being examined.   A special dye will then be injected into the catheter, and X-rays will be taken. The dye will help to show where any  narrowing or blockages are located in the heart arteries.  AFTER THE PROCEDURE   If the procedure is done through the leg, you will be kept in bed lying flat for several hours. You will be instructed to not bend or cross your legs.  The insertion site will be checked frequently.   The pulse in your feet or  wrist will be checked frequently.   Additional blood tests, X-rays, and an electrocardiogram may be done.    This information is not intended to replace advice given to you by your health care provider. Make sure you discuss any questions you have with your health care provider.   Document Released: 05/05/2003 Document Revised: 11/19/2014 Document Reviewed: 03/23/2013 Elsevier Interactive Patient Education [employer]2016 [employer].    If you need a refill on your cardiac medications before your next appointment, please call your pharmacy.

## 2016-09-17 NOTE — Progress Notes (Signed)
Chief Complaint  Patient presents with  . New Patient (Initial Visit)    abnormal cath     History of Present Illness: 65 yo male with history of HTN, HLD, GERD who is here today as a new patient in our office to discuss his abnormal stress test. He was seen recently by Dr. Donnie [facility] in [facility]. He has had a recent appendectomy and CT scan of the abdomen suggested vascular calcification. He was then referred to Dr. Donnie Ahoilley. He had no angina type pains. He has been intolerant of statins. No known CAD but he does have a family history of CAD. He underwent exercise stress testing 09/04/16 which suggested ischemia. He was then referred for a nuclear stress test on 09/14/16 which showed a reversible defect in the inferior wall concerning for ischemia, LVEF=61%. He is here to see me today to discuss his abnormal stress test. Dr. Donnie Ahoilley is not in the office over the next several weeks.   He tells me today that he has no chest pain or dyspnea. No LE edema. No palpitations.   Primary Care Physician: Arletta BaleFUTRELL,THOMAS, MD Primary Cardiologist: Donnie Ahoilley  Past Medical History:  Diagnosis Date  . BPH (benign prostatic hypertrophy)   . GERD (gastroesophageal reflux disease)   . Hyperlipemia   . Hypertension   . Prostatitis     Past Surgical History:  Procedure Laterality Date  . COLONOSCOPY    . LAPAROSCOPIC APPENDECTOMY N/A 07/14/2016   Procedure: APPENDECTOMY LAPAROSCOPIC;  Surgeon: Ovidio Kinavid Newman, MD;  Location: [facility];  Service: General;  Laterality: N/A;  . TOTAL HIP ARTHROPLASTY    . WISDOM TOOTH EXTRACTION      Current Outpatient Prescriptions  Medication Sig Dispense Refill  . aspirin EC 81 MG tablet Take 81 mg by mouth.    [name]. azelastine (OPTIVAR) 0.05 % ophthalmic solution Place 1 drop into both eyes daily.   0  . Coenzyme Q10 (CO Q 10 PO) Take 1 capsule by mouth 2 (two) times daily.    . Cyanocobalamin (RA VITAMIN B-12 TR) 1000 MCG TBCR Take 1,000 mcg by mouth daily.     .  fenofibrate 160 MG tablet Take 160 mg by mouth daily.  0  . Melatonin 3 MG TABS Take 3 mg by mouth at bedtime as needed (sleep).    . montelukast (SINGULAIR) 10 MG tablet Take 10 mg by mouth daily.   1  . Plant Sterol Stanol-Pantethine (CHOLESTOFF COMPLETE) 300-100 MG CAPS Take 2 capsules by mouth daily.     . valsartan (DIOVAN) 160 MG tablet Take 160 mg by mouth daily.   1   No current facility-administered medications for this visit.     Allergies  Allergen Reactions  . Penicillins Swelling and Other (See Comments)    Dyspnea-childhood allergy Has patient had a PCN reaction causing immediate rash, facial/tongue/throat swelling, SOB or lightheadedness with hypotension:unsure Has patient had a PCN reaction causing severe rash involving mucus membranes or skin necrosis:unsure Has patient had a PCN reaction that required hospitalization:unsure Has patient had a PCN reaction occurring within the last 10 years:No If all of the above answers are "NO", then may proceed with Cephalosporin use.      Social History   Social History  . Marital status: Married    Spouse name: N/A  . Number of children: 2  . Years of education: N/A   Occupational History  . sales    Social History Main Topics  . Smoking status: Former Smoker  Packs/day: 0.50    Years: 12.00    Types: Cigarettes    Quit date: 11/17/1978  . Smokeless tobacco: Never Used  . Alcohol use 0.6 oz/week    1 Glasses of wine per week  . Drug use: No  . Sexual activity: Not on file   Other Topics Concern  . Not on file   Social History Narrative  . No narrative on file    Family History  Problem Relation Age of Onset  . Coronary artery disease Mother 6363  . CVA Mother   . Heart attack Father 8447  . Microcephaly Paternal Uncle   . Heart attack Paternal Uncle     Review of Systems:  As stated in the HPI and otherwise negative.   BP 140/82   Pulse 78   Ht 5' 6.5" (1.689 m)   Wt 173 lb 6.4 oz (78.7 kg)   BMI  27.57 kg/m   Physical Examination: General: Well developed, well nourished, NAD  HEENT: OP clear, mucus membranes moist  SKIN: warm, dry. No rashes. Neuro: No focal deficits  Musculoskeletal: Muscle strength 5/5 all ext  Psychiatric: Mood and affect normal  Neck: No JVD, no carotid bruits, no thyromegaly, no lymphadenopathy.  Lungs:Clear bilaterally, no wheezes, rhonci, crackles Cardiovascular: Regular rate and rhythm. No murmurs, gallops or rubs. Abdomen:Soft. Bowel sounds present. Non-tender.  Extremities: No lower extremity edema. Pulses are 2 + in the bilateral DP/PT.  EKG:  EKG is ordered today. The ekg ordered today demonstrates NSR, rate 78 bpm.   Recent Labs: 07/14/2016: ALT 29; BUN 14; Creatinine, Ser 1.22; Hemoglobin 13.6; Platelets 127; Potassium 3.6; Sodium 137   Lipid Panel No results found for: CHOL, TRIG, HDL, CHOLHDL, VLDL, LDLCALC, LDLDIRECT   Wt Readings from Last 3 Encounters:  09/17/16 173 lb 6.4 oz (78.7 kg)  07/14/16 170 lb (77.1 kg)     Other studies Reviewed: Additional studies/ records that were reviewed today include: . Review of the above records demonstrates:   Assessment and Plan:   1. Abnormal stress test/Risk factors for CAD: He has no symptoms worrisome for angina but he does have a strong family history of premature CAD and a high risk stress test. EKG is normal today. I will plan a cardiac cath with possible [facility]. Risks and benefits of procedure reviewed with pt and he agrees to proceed. Pre-cath labs today.   Current medicines are reviewed at length with the patient today.  The patient does not have concerns regarding medicines.  The following changes have been made:  no change  Labs/ tests ordered today include:   Orders Placed This Encounter  Procedures  . CBC w/Diff  . Basic Metabolic Panel (BMET)  . INR/PT  . EKG 12-Lead     Disposition:   FU with me in 4 weeks   Signed, Verne Carrowhristopher Elliott Quade,  MD 09/17/2016 4:43 PM    [facility] [address], [place], [place]  [address] Phone: [phone]; Fax: [phone]

## 2016-09-18 ENCOUNTER — Telehealth: Payer: Self-pay | Admitting: Cardiovascular Disease

## 2016-09-18 LAB — PROTIME-INR
INR: 1
PROTHROMBIN TIME: 11.1 s (ref 9.0–11.5)

## 2016-09-18 LAB — BASIC METABOLIC PANEL
BUN: 24 mg/dL (ref 7–25)
CALCIUM: 10.9 mg/dL — AB (ref 8.6–10.3)
CO2: 25 mmol/L (ref 20–31)
CREATININE: 1.37 mg/dL — AB (ref 0.70–1.25)
Chloride: 107 mmol/L (ref 98–110)
Glucose, Bld: 96 mg/dL (ref 65–99)
Potassium: 4.3 mmol/L (ref 3.5–5.3)
Sodium: 143 mmol/L (ref 135–146)

## 2016-09-18 NOTE — Telephone Encounter (Signed)
I spoke with pt who states he started new exercise/stretching class last week. He needs letter clearing him to continue this class this week.  I asked pt to hold off on class until after catheterization this Friday.  I told him once results of cath known it would be determined when he should resume exercise program.

## 2016-09-18 NOTE — Telephone Encounter (Signed)
New Message  Pt voiced stating his orthopedics needs a release letter or note stating it's okay for the pt to do exercises.  Irene Limborisha Poff Fax:: [phone] Or we can email it to pt.  Pt voiced he needs it prior to Thursday which is his appt with the orthopedics.  Please f/u

## 2016-09-21 ENCOUNTER — Encounter (HOSPITAL_COMMUNITY)
Admission: AD | Disposition: A | Discharge status: Home / Self Care | Payer: Self-pay | Source: Ambulatory Visit | Attending: Cardiovascular Disease

## 2016-09-21 ENCOUNTER — Inpatient Hospital Stay (HOSPITAL_COMMUNITY)
Admission: AD | Admit: 2016-09-21 | Discharge: 2016-09-25 | DRG: 247 | Disposition: A | Discharge status: Home / Self Care | Payer: No Typology Code available for payment source | Source: Ambulatory Visit | Attending: Cardiovascular Disease | Admitting: Cardiovascular Disease

## 2016-09-21 ENCOUNTER — Encounter (HOSPITAL_COMMUNITY): Payer: Self-pay | Admitting: Cardiovascular Disease

## 2016-09-21 DIAGNOSIS — Z79899 Other long term (current) drug therapy: Secondary | ICD-10-CM

## 2016-09-21 DIAGNOSIS — E785 Hyperlipidemia, unspecified: Secondary | ICD-10-CM | POA: Diagnosis present

## 2016-09-21 DIAGNOSIS — K219 Gastro-esophageal reflux disease without esophagitis: Secondary | ICD-10-CM | POA: Diagnosis present

## 2016-09-21 DIAGNOSIS — N400 Enlarged prostate without lower urinary tract symptoms: Secondary | ICD-10-CM | POA: Diagnosis present

## 2016-09-21 DIAGNOSIS — Z7982 Long term (current) use of aspirin: Secondary | ICD-10-CM | POA: Diagnosis not present

## 2016-09-21 DIAGNOSIS — Z87891 Personal history of nicotine dependence: Secondary | ICD-10-CM | POA: Diagnosis not present

## 2016-09-21 DIAGNOSIS — R9439 Abnormal result of other cardiovascular function study: Secondary | ICD-10-CM

## 2016-09-21 DIAGNOSIS — I129 Hypertensive chronic kidney disease with stage 1 through stage 4 chronic kidney disease, or unspecified chronic kidney disease: Secondary | ICD-10-CM | POA: Diagnosis present

## 2016-09-21 DIAGNOSIS — N189 Chronic kidney disease, unspecified: Secondary | ICD-10-CM | POA: Diagnosis present

## 2016-09-21 DIAGNOSIS — I2510 Atherosclerotic heart disease of native coronary artery without angina pectoris: Principal | ICD-10-CM

## 2016-09-21 DIAGNOSIS — Z823 Family history of stroke: Secondary | ICD-10-CM

## 2016-09-21 DIAGNOSIS — I25110 Atherosclerotic heart disease of native coronary artery with unstable angina pectoris: Secondary | ICD-10-CM | POA: Diagnosis not present

## 2016-09-21 DIAGNOSIS — Z8249 Family history of ischemic heart disease and other diseases of the circulatory system: Secondary | ICD-10-CM | POA: Diagnosis not present

## 2016-09-21 DIAGNOSIS — Z96649 Presence of unspecified artificial hip joint: Secondary | ICD-10-CM | POA: Diagnosis present

## 2016-09-21 DIAGNOSIS — Z955 Presence of coronary angioplasty implant and graft: Secondary | ICD-10-CM

## 2016-09-21 HISTORY — DX: Allergy status to unspecified drugs, medicaments and biological substances: Z88.9

## 2016-09-21 HISTORY — DX: Atherosclerotic heart disease of native coronary artery without angina pectoris: I25.10

## 2016-09-21 HISTORY — PX: CARDIAC CATHETERIZATION: SHX172

## 2016-09-21 SURGERY — LEFT HEART CATH AND CORONARY ANGIOGRAPHY

## 2016-09-21 MED ORDER — CYANOCOBALAMIN 500 MCG PO TABS
1000.0000 ug | ORAL_TABLET | Freq: Every day | ORAL | Status: DC
  Filled 2016-09-21: qty 2

## 2016-09-21 MED ORDER — IOPAMIDOL (ISOVUE-370) INJECTION 76%
INTRAVENOUS | Status: DC | PRN
  Administered 2016-09-21: 70 mL via INTRA_ARTERIAL

## 2016-09-21 MED ORDER — CALCIUM CARBONATE ANTACID 500 MG PO CHEW
1.0000 | CHEWABLE_TABLET | Freq: Two times a day (BID) | ORAL | Status: DC
  Administered 2016-09-21 – 2016-09-25 (×9): 200 mg via ORAL
  Filled 2016-09-21 (×9): qty 1

## 2016-09-21 MED ORDER — HEPARIN SODIUM (PORCINE) 1000 UNIT/ML IJ SOLN
INTRAMUSCULAR | Status: DC | PRN
  Administered 2016-09-21: 4000 [IU] via INTRAVENOUS

## 2016-09-21 MED ORDER — VERAPAMIL HCL 2.5 MG/ML IV SOLN
INTRAVENOUS | Status: DC | PRN
  Administered 2016-09-21: 10 mL via INTRA_ARTERIAL

## 2016-09-21 MED ORDER — FAMOTIDINE IN NACL 20-0.9 MG/50ML-% IV SOLN
INTRAVENOUS | Status: AC
  Filled 2016-09-21: qty 50

## 2016-09-21 MED ORDER — SODIUM CHLORIDE 0.9 % IV SOLN: 250.0000 mL | INTRAVENOUS | Status: DC | PRN

## 2016-09-21 MED ORDER — ASPIRIN EC 81 MG PO TBEC
81.0000 mg | DELAYED_RELEASE_TABLET | Freq: Every day | ORAL | Status: DC
  Administered 2016-09-22 – 2016-09-25 (×3): 81 mg via ORAL
  Filled 2016-09-21 (×4): qty 1

## 2016-09-21 MED ORDER — MIDAZOLAM HCL 2 MG/2ML IJ SOLN
INTRAMUSCULAR | Status: AC
  Filled 2016-09-21: qty 2

## 2016-09-21 MED ORDER — ONDANSETRON HCL 4 MG/2ML IJ SOLN: 4.0000 mg | Freq: Four times a day (QID) | INTRAMUSCULAR | Status: DC | PRN

## 2016-09-21 MED ORDER — FAMOTIDINE IN NACL 20-0.9 MG/50ML-% IV SOLN
20.0000 mg | Freq: Once | INTRAVENOUS | Status: AC
  Administered 2016-09-21: 20 mg via INTRAVENOUS

## 2016-09-21 MED ORDER — KETOTIFEN FUMARATE 0.025 % OP SOLN
1.0000 [drp] | Freq: Every day | OPHTHALMIC | Status: DC
  Administered 2016-09-21 – 2016-09-25 (×5): 1 [drp] via OPHTHALMIC
  Filled 2016-09-21: qty 5

## 2016-09-21 MED ORDER — SODIUM CHLORIDE 0.9% FLUSH: 3.0000 mL | INTRAVENOUS | Status: DC | PRN

## 2016-09-21 MED ORDER — FENOFIBRATE 160 MG PO TABS: 160.0000 mg | ORAL_TABLET | Freq: Every day | ORAL | Status: DC

## 2016-09-21 MED ORDER — CLOPIDOGREL BISULFATE 75 MG PO TABS
75.0000 mg | ORAL_TABLET | Freq: Every day | ORAL | Status: DC
  Administered 2016-09-22 – 2016-09-25 (×4): 75 mg via ORAL
  Filled 2016-09-21 (×5): qty 1

## 2016-09-21 MED ORDER — FENTANYL CITRATE (PF) 100 MCG/2ML IJ SOLN
INTRAMUSCULAR | Status: AC
  Filled 2016-09-21: qty 2

## 2016-09-21 MED ORDER — FOLIC ACID 1 MG PO TABS
1.0000 mg | ORAL_TABLET | Freq: Two times a day (BID) | ORAL | Status: DC
  Administered 2016-09-21 – 2016-09-25 (×8): 1 mg via ORAL
  Filled 2016-09-21 (×8): qty 1

## 2016-09-21 MED ORDER — LIDOCAINE HCL (PF) 1 % IJ SOLN
INTRAMUSCULAR | Status: AC
  Filled 2016-09-21: qty 30

## 2016-09-21 MED ORDER — ACETAMINOPHEN 325 MG PO TABS: 650.0000 mg | ORAL_TABLET | ORAL | Status: DC | PRN

## 2016-09-21 MED ORDER — HEPARIN (PORCINE) IN NACL 2-0.9 UNIT/ML-% IJ SOLN
INTRAMUSCULAR | Status: AC
  Filled 2016-09-21: qty 1000

## 2016-09-21 MED ORDER — LIDOCAINE HCL (PF) 1 % IJ SOLN
INTRAMUSCULAR | Status: DC | PRN
  Administered 2016-09-21: 2 mL

## 2016-09-21 MED ORDER — SODIUM CHLORIDE 0.9 % IV SOLN: INTRAVENOUS | Status: AC

## 2016-09-21 MED ORDER — HEPARIN (PORCINE) IN NACL 2-0.9 UNIT/ML-% IJ SOLN
INTRAMUSCULAR | Status: DC | PRN
  Administered 2016-09-21: 1000 mL

## 2016-09-21 MED ORDER — SODIUM CHLORIDE 0.9% FLUSH
3.0000 mL | Freq: Two times a day (BID) | INTRAVENOUS | Status: DC
  Administered 2016-09-22 – 2016-09-23 (×2): 3 mL via INTRAVENOUS

## 2016-09-21 MED ORDER — MIDAZOLAM HCL 2 MG/2ML IJ SOLN
INTRAMUSCULAR | Status: DC | PRN
  Administered 2016-09-21: 1 mg via INTRAVENOUS
  Administered 2016-09-21: 2 mg via INTRAVENOUS

## 2016-09-21 MED ORDER — IOPAMIDOL (ISOVUE-370) INJECTION 76%
INTRAVENOUS | Status: AC
  Filled 2016-09-21: qty 100

## 2016-09-21 MED ORDER — CLOPIDOGREL BISULFATE 300 MG PO TABS
ORAL_TABLET | ORAL | Status: AC
  Filled 2016-09-21: qty 2

## 2016-09-21 MED ORDER — INFLUENZA VAC SPLIT QUAD 0.5 ML IM SUSY: 0.5000 mL | PREFILLED_SYRINGE | INTRAMUSCULAR | Status: DC

## 2016-09-21 MED ORDER — ASPIRIN 81 MG PO CHEW
CHEWABLE_TABLET | ORAL | Status: AC
  Administered 2016-09-21: 81 mg via ORAL
  Filled 2016-09-21: qty 1

## 2016-09-21 MED ORDER — FENOFIBRATE 160 MG PO TABS
160.0000 mg | ORAL_TABLET | Freq: Every day | ORAL | Status: DC
  Administered 2016-09-22 – 2016-09-25 (×4): 160 mg via ORAL
  Filled 2016-09-21 (×4): qty 1

## 2016-09-21 MED ORDER — SODIUM CHLORIDE 0.9 % IV SOLN
INTRAVENOUS | Status: DC
  Administered 2016-09-21: 07:00:00 via INTRAVENOUS

## 2016-09-21 MED ORDER — FENTANYL CITRATE (PF) 100 MCG/2ML IJ SOLN
INTRAMUSCULAR | Status: DC | PRN
  Administered 2016-09-21: 50 ug via INTRAVENOUS
  Administered 2016-09-21: 25 ug via INTRAVENOUS

## 2016-09-21 MED ORDER — CLOPIDOGREL BISULFATE 75 MG PO TABS
600.0000 mg | ORAL_TABLET | Freq: Once | ORAL | Status: AC
  Administered 2016-09-21: 600 mg via ORAL

## 2016-09-21 MED ORDER — SODIUM CHLORIDE 0.9% FLUSH: 3.0000 mL | Freq: Two times a day (BID) | INTRAVENOUS | Status: DC

## 2016-09-21 MED ORDER — VERAPAMIL HCL 2.5 MG/ML IV SOLN
INTRAVENOUS | Status: AC
  Filled 2016-09-21: qty 2

## 2016-09-21 MED ORDER — CLOPIDOGREL BISULFATE 75 MG PO TABS: 75.0000 mg | ORAL_TABLET | Freq: Every day | ORAL | Status: DC

## 2016-09-21 MED ORDER — HEPARIN SODIUM (PORCINE) 1000 UNIT/ML IJ SOLN
INTRAMUSCULAR | Status: AC
  Filled 2016-09-21: qty 1

## 2016-09-21 MED ORDER — MONTELUKAST SODIUM 10 MG PO TABS
10.0000 mg | ORAL_TABLET | Freq: Every day | ORAL | Status: DC
  Administered 2016-09-21 – 2016-09-25 (×5): 10 mg via ORAL
  Filled 2016-09-21 (×5): qty 1

## 2016-09-21 MED ORDER — IRBESARTAN 75 MG PO TABS
75.0000 mg | ORAL_TABLET | Freq: Every day | ORAL | Status: DC
  Administered 2016-09-22 – 2016-09-25 (×4): 75 mg via ORAL
  Filled 2016-09-21 (×4): qty 1

## 2016-09-21 MED ORDER — VITAMIN B-12 1000 MCG PO TABS
1000.0000 ug | ORAL_TABLET | Freq: Every day | ORAL | Status: DC
  Administered 2016-09-22 – 2016-09-25 (×4): 1000 ug via ORAL
  Filled 2016-09-21: qty 2
  Filled 2016-09-21 (×3): qty 1
  Filled 2016-09-21: qty 2

## 2016-09-21 MED ORDER — MELATONIN 3 MG PO TABS
3.0000 mg | ORAL_TABLET | Freq: Every evening | ORAL | Status: DC | PRN
  Administered 2016-09-22 – 2016-09-24 (×3): 3 mg via ORAL
  Filled 2016-09-21 (×5): qty 1

## 2016-09-21 MED ORDER — ASPIRIN 81 MG PO CHEW
81.0000 mg | CHEWABLE_TABLET | ORAL | Status: AC
  Administered 2016-09-21: 81 mg via ORAL

## 2016-09-21 SURGICAL SUPPLY — 9 items
CATH IMPULSE 5F ANG/FL3.5 (CATHETERS) ×3 IMPLANT
DEVICE RAD COMP TR BAND LRG (VASCULAR PRODUCTS) ×3 IMPLANT
GLIDESHEATH SLEND SS 6F .021 (SHEATH) ×3 IMPLANT
GUIDEWIRE INQWIRE 1.5J.035X260 (WIRE) ×1 IMPLANT
INQWIRE 1.5J .035X260CM (WIRE) ×3
KIT HEART LEFT (KITS) ×3 IMPLANT
PACK CARDIAC CATHETERIZATION (CUSTOM PROCEDURE TRAY) ×3 IMPLANT
TRANSDUCER W/STOPCOCK (MISCELLANEOUS) ×3 IMPLANT
TUBING CIL FLEX 10 FLL-RA (TUBING) ×3 IMPLANT

## 2016-09-21 NOTE — Interval H&P Note (Signed)
History and Physical Interval Note:  09/21/2016 7:23 AM  [name]  has presented today for cardiac cath with the diagnosis of abnormal stress test  The various methods of treatment have been discussed with the patient and family. After consideration of risks, benefits and other options for treatment, the patient has consented to  Procedure(s): Left Heart Cath and Coronary Angiography (N/A) as a surgical intervention .  The patient's history has been reviewed, patient examined, no change in status, stable for surgery.  I have reviewed the patient's chart and labs.  Questions were answered to the patient's satisfaction.    Cath Lab Visit (complete for each Cath Lab visit)  Clinical Evaluation Leading to the Procedure:   ACS: No.  Non-ACS:    Anginal Classification: No Symptoms  Anti-ischemic medical therapy: No Therapy  Non-Invasive Test Results: High-risk stress test findings: cardiac mortality >3%/year  Prior CABG: No previous CABG         Verne Carrowhristopher Baileigh Modisette

## 2016-09-21 NOTE — Research (Signed)
[place] STUDY Informed Consent   Subject Name: Sean Schultz  Subject met inclusion and exclusion criteria.  The informed consent form, study requirements and expectations were reviewed with the subject and questions and concerns were addressed prior to the signing of the consent form.  The subject verbalized understanding of the trail requirements.  The subject agreed to participate in the [place] trial and signed the informed consent.  The informed consent was obtained prior to performance of any protocol-specific procedures for the subject.  A copy of the signed informed consent was given to the subject and a copy was placed in the subject's medical record.  Desmond Dike H 09/21/2016, 07:00 AM

## 2016-09-21 NOTE — H&P (View-Only) (Signed)
  Chief Complaint  Patient presents with  . New Patient (Initial Visit)    abnormal cath     History of Present Illness: 65 yo male with history of HTN, HLD, GERD who is here today as a new patient in our office to discuss his abnormal stress test. He was seen recently by [facility] in [facility]. He has had a recent appendectomy and CT scan of the abdomen suggested vascular calcification. He was then referred to Dr. [facility]. He had no angina type pains. He has been intolerant of statins. No known CAD but he does have a family history of CAD. He underwent exercise stress testing 09/04/16 which suggested ischemia. He was then referred for a nuclear stress test on 09/14/16 which showed a reversible defect in the inferior wall concerning for ischemia, LVEF=61%. He is here to see me today to discuss his abnormal stress test. Dr. [facility] is not in the office over the next several weeks.   He tells me today that he has no chest pain or dyspnea. No LE edema. No palpitations.   Primary Care Physician: FUTRELL,THOMAS, MD Primary Cardiologist: [facility]  Past Medical History:  Diagnosis Date  . BPH (benign prostatic hypertrophy)   . GERD (gastroesophageal reflux disease)   . Hyperlipemia   . Hypertension   . Prostatitis     Past Surgical History:  Procedure Laterality Date  . COLONOSCOPY    . LAPAROSCOPIC APPENDECTOMY N/A 07/14/2016   Procedure: APPENDECTOMY LAPAROSCOPIC;  Surgeon: David Newman, MD;  Location: [facility];  Service: General;  Laterality: N/A;  . TOTAL HIP ARTHROPLASTY    . WISDOM TOOTH EXTRACTION      Current Outpatient Prescriptions  Medication Sig Dispense Refill  . aspirin EC 81 MG tablet Take 81 mg by mouth.    . azelastine (OPTIVAR) 0.05 % ophthalmic solution Place 1 drop into both eyes daily.   0  . Coenzyme Q10 (CO Q 10 PO) Take 1 capsule by mouth 2 (two) times daily.    . Cyanocobalamin (RA VITAMIN B-12 TR) 1000 MCG TBCR Take 1,000 mcg by mouth daily.     .  fenofibrate 160 MG tablet Take 160 mg by mouth daily.  0  . Melatonin 3 MG TABS Take 3 mg by mouth at bedtime as needed (sleep).    . montelukast (SINGULAIR) 10 MG tablet Take 10 mg by mouth daily.   1  . Plant Sterol Stanol-Pantethine (CHOLESTOFF COMPLETE) 300-100 MG CAPS Take 2 capsules by mouth daily.     . valsartan (DIOVAN) 160 MG tablet Take 160 mg by mouth daily.   1   No current facility-administered medications for this visit.     Allergies  Allergen Reactions  . Penicillins Swelling and Other (See Comments)    Dyspnea-childhood allergy Has patient had a PCN reaction causing immediate rash, facial/tongue/throat swelling, SOB or lightheadedness with hypotension:unsure Has patient had a PCN reaction causing severe rash involving mucus membranes or skin necrosis:unsure Has patient had a PCN reaction that required hospitalization:unsure Has patient had a PCN reaction occurring within the last 10 years:No If all of the above answers are "NO", then may proceed with Cephalosporin use.      Social History   Social History  . Marital status: Married    Spouse name: N/A  . Number of children: 2  . Years of education: N/A   Occupational History  . sales    Social History Main Topics  . Smoking status: Former Smoker      Packs/day: 0.50    Years: 12.00    Types: Cigarettes    Quit date: 11/17/1978  . Smokeless tobacco: Never Used  . Alcohol use 0.6 oz/week    1 Glasses of wine per week  . Drug use: No  . Sexual activity: Not on file   Other Topics Concern  . Not on file   Social History Narrative  . No narrative on file    Family History  Problem Relation Age of Onset  . Coronary artery disease Mother 6363  . CVA Mother   . Heart attack Father 8447  . Microcephaly Paternal Uncle   . Heart attack Paternal Uncle     Review of Systems:  As stated in the HPI and otherwise negative.   BP 140/82   Pulse 78   Ht 5' 6.5" (1.689 m)   Wt 173 lb 6.4 oz (78.7 kg)   BMI  27.57 kg/m   Physical Examination: General: Well developed, well nourished, NAD  HEENT: OP clear, mucus membranes moist  SKIN: warm, dry. No rashes. Neuro: No focal deficits  Musculoskeletal: Muscle strength 5/5 all ext  Psychiatric: Mood and affect normal  Neck: No JVD, no carotid bruits, no thyromegaly, no lymphadenopathy.  Lungs:Clear bilaterally, no wheezes, rhonci, crackles Cardiovascular: Regular rate and rhythm. No murmurs, gallops or rubs. Abdomen:Soft. Bowel sounds present. Non-tender.  Extremities: No lower extremity edema. Pulses are 2 + in the bilateral DP/PT.  EKG:  EKG is ordered today. The ekg ordered today demonstrates NSR, rate 78 bpm.   Recent Labs: 07/14/2016: ALT 29; BUN 14; Creatinine, Ser 1.22; Hemoglobin 13.6; Platelets 127; Potassium 3.6; Sodium 137   Lipid Panel No results found for: CHOL, TRIG, HDL, CHOLHDL, VLDL, LDLCALC, LDLDIRECT   Wt Readings from Last 3 Encounters:  09/17/16 173 lb 6.4 oz (78.7 kg)  07/14/16 170 lb (77.1 kg)     Other studies Reviewed: Additional studies/ records that were reviewed today include: . Review of the above records demonstrates:   Assessment and Plan:   1. Abnormal stress test/Risk factors for CAD: He has no symptoms worrisome for angina but he does have a strong family history of premature CAD and a high risk stress test. EKG is normal today. I will plan a cardiac cath with possible PCI [facility] 09[facility]. Risks and benefits of procedure reviewed with pt and he agrees to proceed. Pre-cath labs today.   Current medicines are reviewed at length with the patient today.  The patient does not have concerns regarding medicines.  The following changes have been made:  no change  Labs/ tests ordered today include:   Orders Placed This Encounter  Procedures  . CBC w/Diff  . Basic Metabolic Panel (BMET)  . INR/PT  . EKG 12-Lead     Disposition:   FU with me in 4 weeks   Signed, Verne Carrowhristopher Chantal Worthey,  MD 09/17/2016 4:43 PM    [facility] [address], [place], [place]  [address] Phone: [phone]; Fax: [phone]) [phone]

## 2016-09-22 MED ORDER — SODIUM CHLORIDE 0.9 % IV SOLN
INTRAVENOUS | Status: DC
  Administered 2016-09-22: 19:00:00 via INTRAVENOUS

## 2016-09-22 NOTE — Progress Notes (Signed)
Patient ID: Sean Schultz, male   DOB: 1951/01/18, 65 y.o.   MRN: [id number]   Patient Name: Sean Schultz Date of Encounter: 09/22/2016  [facility]ologist: [facility]  Hospital Problem List     Active Problems:   Abnormal stress test   Coronary artery disease involving native coronary artery of native heart   CAD in native artery     Subjective   No chest pain Fluid makes him urinate a lot wants to shower Lots of questions about atherectomy   Inpatient Medications    Scheduled Meds: . aspirin EC  81 mg Oral Daily  . calcium carbonate  1 tablet Oral BID  . clopidogrel  75 mg Oral Daily  . cyanocobalamin  1,000 mcg Oral Daily  . fenofibrate  160 mg Oral Daily  . folic acid  1 mg Oral BID  . Influenza vac split quadrivalent PF  0.5 mL Intramuscular Tomorrow-1000  . irbesartan  75 mg Oral Daily  . ketotifen  1 drop Both Eyes Daily  . montelukast  10 mg Oral Daily  . sodium chloride flush  3 mL Intravenous Q12H   Continuous Infusions: . sodium chloride     PRN Meds: sodium chloride, acetaminophen, Melatonin, ondansetron (ZOFRAN) IV, sodium chloride flush   Vital Signs    Vitals:   09/21/16 2100 09/21/16 2200 09/22/16 0500 09/22/16 0842  BP: 118/72  (!) 140/97 137/73  Pulse: 74  78 87  Resp: 18  18 18   Temp: 97.3 F (36.3 C)  97.8 F (36.6 C) 97.2 F (36.2 C)  TempSrc: Oral  Oral Oral  SpO2: 98%  99% 100%  Weight:  78.5 kg (173 lb)    Height:  5' 6.5" (1.689 m)      Intake/Output Summary (Last 24 hours) at 09/22/16 1009 Last data filed at 09/22/16 0800  Gross per 24 hour  Intake              240 ml  Output             1000 ml  Net             -760 ml   Filed Weights   09/21/16 2200  Weight: 78.5 kg (173 lb)    Physical Exam    GEN: Well nourished, well developed, in no acute distress.  HEENT: Grossly normal.  Neck: Supple, no JVD, carotid bruits, or masses. Cardiac: RRR, no murmurs, rubs, or gallops. No clubbing, cyanosis, edema.   Radials/DP/PT 2+ and equal bilaterally.  Respiratory:  Respirations regular and unlabored, clear to auscultation bilaterally. GI: Soft, nontender, nondistended, BS + x 4. MS: no deformity or atrophy. Skin: warm and dry, no rash. Neuro:  Strength and sensation are intact. Psych: AAOx3.  Normal affect.  Labs    CBC No results for input(s): WBC, NEUTROABS, HGB, HCT, MCV, PLT in the last 72 hours. Basic Metabolic Panel No results for input(s): NA, K, CL, CO2, GLUCOSE, BUN, CREATININE, CALCIUM, MG, PHOS in the last 72 hours. Liver Function Tests No results for input(s): AST, ALT, ALKPHOS, BILITOT, PROT, ALBUMIN in the last 72 hours. No results for input(s): LIPASE, AMYLASE in the last 72 hours. Cardiac Enzymes No results for input(s): CKTOTAL, CKMB, CKMBINDEX, TROPONINI in the last 72 hours. BNP Invalid input(s): POCBNP D-Dimer No results for input(s): DDIMER in the last 72 hours. Hemoglobin A1C No results for input(s): HGBA1C in the last 72 hours. Fasting Lipid Panel No results for input(s): CHOL, HDL, LDLCALC, TRIG, CHOLHDL, LDLDIRECT  in the last 72 hours. Thyroid Function Tests No results for input(s): TSH, T4TOTAL, T3FREE, THYROIDAB in the last 72 hours.  Invalid input(s): FREET3  Telemetry    NSR 09/22/2016  - Personally Reviewed  ECG    NSR no acute changes  - Personally Reviewed  Radiology    No results found.  Cardiac Studies   Cath   09/21/16  Conclusion     Mid RCA-1 lesion, 60 %stenosed.  Mid RCA-2 lesion, 99 %stenosed.  Ost LAD to Prox LAD lesion, 40 %stenosed.  Mid LAD lesion, 20 %stenosed.  Dist LAD lesion, 40 %stenosed.  Ost 1st Diag to 1st Diag lesion, 30 %stenosed.   1. 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 Moderate calcific stenosis proximal and distal LAD  Recommendations: Will plan orbital atherectomy of the severe calcified stenosis in the mid to distal RCA. Will plan to admit over the weekend for hydration given his  chronic kidney disease. Will use femoral artery access for PCI/atherectomy on Monday. Will load with Plavix today. Medical management of non-obstructive disease in the LAD.      Patient Profile     65 y.o. with severe calcified mid RCA disease. CRF with Cr 1.4 admitted for hydration Before complex rotational atherectomy intervention CM on Monday   Assessment & Plan    CAD: for atherectomy Monday have increased hydration rate to 75cc/hr check BMET in am No chest pain On ASA and Plavix  CRF: see above Cr 1.4 repeat in am hydrate via iv  HTN:  Continue low dose ARB   Signed, Charlton HawsPeter Nishan, MD  09/22/2016, 10:09 AM

## 2016-09-23 LAB — BASIC METABOLIC PANEL
ANION GAP: 8 (ref 5–15)
BUN: 15 mg/dL (ref 6–20)
CALCIUM: 9.9 mg/dL (ref 8.9–10.3)
CO2: 24 mmol/L (ref 22–32)
CREATININE: 1.07 mg/dL (ref 0.61–1.24)
Chloride: 107 mmol/L (ref 101–111)
Glucose, Bld: 91 mg/dL (ref 65–99)
Potassium: 3.9 mmol/L (ref 3.5–5.1)
Sodium: 139 mmol/L (ref 135–145)

## 2016-09-23 MED ORDER — SODIUM CHLORIDE 0.9 % WEIGHT BASED INFUSION
1.0000 mL/kg/h | INTRAVENOUS | Status: DC
  Administered 2016-09-24: 1 mL/kg/h via INTRAVENOUS

## 2016-09-23 MED ORDER — SODIUM CHLORIDE 0.9 % IV SOLN: 250.0000 mL | INTRAVENOUS | Status: DC | PRN

## 2016-09-23 MED ORDER — SODIUM CHLORIDE 0.9% FLUSH: 3.0000 mL | Freq: Two times a day (BID) | INTRAVENOUS | Status: DC

## 2016-09-23 MED ORDER — SODIUM CHLORIDE 0.9% FLUSH: 3.0000 mL | INTRAVENOUS | Status: DC | PRN

## 2016-09-23 MED ORDER — ASPIRIN 81 MG PO CHEW
81.0000 mg | CHEWABLE_TABLET | ORAL | Status: AC
  Administered 2016-09-24: 81 mg via ORAL
  Filled 2016-09-23: qty 1

## 2016-09-23 NOTE — Progress Notes (Signed)
Patient ID: Sean Schultz, male   DOB: Nov 08, 1951, 65 y.o.   MRN: [id number]   Patient Name: Sean Schultz Date of Encounter: 09/23/2016  [facility]  Hospital Problem List     Active Problems:   Abnormal stress test   Coronary artery disease involving native coronary artery of native heart   CAD in native artery     Subjective   NO pain urinating a lot at current hydration rate   Inpatient Medications    Scheduled Meds: . aspirin EC  81 mg Oral Daily  . calcium carbonate  1 tablet Oral BID  . clopidogrel  75 mg Oral Daily  . fenofibrate  160 mg Oral Daily  . folic acid  1 mg Oral BID  . Influenza vac split quadrivalent PF  0.5 mL Intramuscular Tomorrow-1000  . irbesartan  75 mg Oral Daily  . ketotifen  1 drop Both Eyes Daily  . montelukast  10 mg Oral Daily  . sodium chloride flush  3 mL Intravenous Q12H  . vitamin B-12  1,000 mcg Oral Daily   Continuous Infusions: . sodium chloride 75 mL/hr at 09/22/16 1856   PRN Meds: sodium chloride, acetaminophen, Melatonin, ondansetron (ZOFRAN) IV, sodium chloride flush   Vital Signs    Vitals:   09/22/16 0842 09/22/16 1418 09/22/16 2100 09/23/16 0500  BP: 137/73 (!) 158/82 114/77 121/69  Pulse: 87 81 83 62  Resp: 18 18 16 15   Temp: 97.2 F (36.2 C) 97.5 F (36.4 C) 97.9 F (36.6 C) 97.7 F (36.5 C)  TempSrc: Oral Oral    SpO2: 100% 100% 99% 100%  Weight:    76.4 kg (168 lb 8 oz)  Height:        Intake/Output Summary (Last 24 hours) at 09/23/16 1014 Last data filed at 09/23/16 0916  Gross per 24 hour  Intake          2303.75 ml  Output             1925 ml  Net           378.75 ml   Filed Weights   09/21/16 2200 09/23/16 0500  Weight: 78.5 kg (173 lb) 76.4 kg (168 lb 8 oz)    Physical Exam    GEN: Well nourished, well developed, in no acute distress.  HEENT: Grossly normal.  Neck: Supple, no JVD, carotid bruits, or masses. Cardiac: RRR, no murmurs, rubs, or gallops. No  clubbing, cyanosis, edema.  Radials/DP/PT 2+ and equal bilaterally.  Respiratory:  Respirations regular and unlabored, clear to auscultation bilaterally. GI: Soft, nontender, nondistended, BS + x 4. MS: no deformity or atrophy. Skin: warm and dry, no rash. Neuro:  Strength and sensation are intact. Psych: AAOx3.  Normal affect.  Labs    CBC No results for input(s): WBC, NEUTROABS, HGB, HCT, MCV, PLT in the last 72 hours. Basic Metabolic Panel No results for input(s): NA, K, CL, CO2, GLUCOSE, BUN, CREATININE, CALCIUM, MG, PHOS in the last 72 hours. Liver Function Tests No results for input(s): AST, ALT, ALKPHOS, BILITOT, PROT, ALBUMIN in the last 72 hours. No results for input(s): LIPASE, AMYLASE in the last 72 hours. Cardiac Enzymes No results for input(s): CKTOTAL, CKMB, CKMBINDEX, TROPONINI in the last 72 hours. BNP Invalid input(s): POCBNP D-Dimer No results for input(s): DDIMER in the last 72 hours. Hemoglobin A1C No results for input(s): HGBA1C in the last 72 hours. Fasting Lipid Panel No results for input(s): CHOL, HDL, LDLCALC, TRIG, CHOLHDL, LDLDIRECT  in the last 72 hours. Thyroid Function Tests No results for input(s): TSH, T4TOTAL, T3FREE, THYROIDAB in the last 72 hours.  Invalid input(s): FREET3  Telemetry    NSR 09/23/2016  - Personally Reviewed  ECG    NSR no acute changes  - Personally Reviewed  Radiology    No results found.  Cardiac Studies   Cath   09/21/16  Conclusion     Mid RCA-1 lesion, 60 %stenosed.  Mid RCA-2 lesion, 99 %stenosed.  Ost LAD to Prox LAD lesion, 40 %stenosed.  Mid LAD lesion, 20 %stenosed.  Dist LAD lesion, 40 %stenosed.  Ost 1st Diag to 1st Diag lesion, 30 %stenosed.   1. 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 Moderate calcific stenosis proximal and distal LAD  Recommendations: Will plan orbital atherectomy of the severe calcified stenosis in the mid to distal RCA. Will plan to admit over the  weekend for hydration given his chronic kidney disease. Will use femoral artery access for PCI/atherectomy on Monday. Will load with Plavix today. Medical management of non-obstructive disease in the LAD.      Patient Profile     65 y.o. with severe calcified mid RCA disease. CRF with Cr 1.4 admitted for hydration Before complex rotational atherectomy intervention CM on Monday   Assessment & Plan    CAD: for atherectomy Monday have increased hydration rate to 75cc/hr check BMET in am No chest pain On ASA and Plavix  CRF: see above Cr 1.4 repeat today should be better if not will increase rate to 100cc/hr  HTN:  Continue low dose ARB   Signed, Charlton HawsPeter Mayela Bullard, MD  09/23/2016, 10:14 AM  Patient ID: Sean Schultz, male   DOB: 08/29/1951, 65 y.o.   MRN: [id number]

## 2016-09-24 ENCOUNTER — Encounter (HOSPITAL_COMMUNITY)
Admission: AD | Disposition: A | Discharge status: Home / Self Care | Payer: Self-pay | Source: Ambulatory Visit | Attending: Cardiovascular Disease

## 2016-09-24 DIAGNOSIS — I25110 Atherosclerotic heart disease of native coronary artery with unstable angina pectoris: Secondary | ICD-10-CM

## 2016-09-24 HISTORY — PX: CARDIAC CATHETERIZATION: SHX172

## 2016-09-24 LAB — POCT ACTIVATED CLOTTING TIME: Activated Clotting Time: 400 seconds

## 2016-09-24 SURGERY — CORONARY STENT INTERVENTION
Anesthesia: LOCAL

## 2016-09-24 MED ORDER — MIDAZOLAM HCL 2 MG/2ML IJ SOLN
INTRAMUSCULAR | Status: AC
  Filled 2016-09-24: qty 2

## 2016-09-24 MED ORDER — SODIUM CHLORIDE 0.9 % IV SOLN
INTRAVENOUS | Status: DC | PRN
  Administered 2016-09-24 (×2): 1.75 mg/kg/h via INTRAVENOUS

## 2016-09-24 MED ORDER — SODIUM CHLORIDE 0.9% FLUSH: 3.0000 mL | INTRAVENOUS | Status: DC | PRN

## 2016-09-24 MED ORDER — BIVALIRUDIN 250 MG IV SOLR
INTRAVENOUS | Status: AC
  Filled 2016-09-24: qty 250

## 2016-09-24 MED ORDER — SODIUM CHLORIDE 0.9 % IV SOLN: INTRAVENOUS | Status: AC

## 2016-09-24 MED ORDER — LIDOCAINE HCL (PF) 1 % IJ SOLN
INTRAMUSCULAR | Status: DC | PRN
  Administered 2016-09-24: 20 mL via SUBCUTANEOUS

## 2016-09-24 MED ORDER — NITROGLYCERIN 1 MG/10 ML FOR IR/CATH LAB
INTRA_ARTERIAL | Status: DC | PRN
  Administered 2016-09-24 (×2): 200 ug via INTRACORONARY

## 2016-09-24 MED ORDER — SODIUM CHLORIDE 0.9% FLUSH
3.0000 mL | Freq: Two times a day (BID) | INTRAVENOUS | Status: DC
  Administered 2016-09-24: 19:00:00 3 mL via INTRAVENOUS

## 2016-09-24 MED ORDER — HEPARIN (PORCINE) IN NACL 2-0.9 UNIT/ML-% IJ SOLN
INTRAMUSCULAR | Status: AC
  Filled 2016-09-24: qty 1000

## 2016-09-24 MED ORDER — MIDAZOLAM HCL 2 MG/2ML IJ SOLN
INTRAMUSCULAR | Status: DC | PRN
  Administered 2016-09-24: 1 mg via INTRAVENOUS
  Administered 2016-09-24: 2 mg via INTRAVENOUS

## 2016-09-24 MED ORDER — SODIUM CHLORIDE 0.9 % IV SOLN
INTRAVENOUS | Status: DC | PRN
  Administered 2016-09-24: 250 mL via INTRAVENOUS

## 2016-09-24 MED ORDER — FENTANYL CITRATE (PF) 100 MCG/2ML IJ SOLN
INTRAMUSCULAR | Status: DC | PRN
  Administered 2016-09-24: 50 ug via INTRAVENOUS
  Administered 2016-09-24: 25 ug via INTRAVENOUS

## 2016-09-24 MED ORDER — VIPERSLIDE LUBRICANT OPTIME
TOPICAL | Status: DC | PRN
  Administered 2016-09-24: 14:00:00 via SURGICAL_CAVITY

## 2016-09-24 MED ORDER — FENTANYL CITRATE (PF) 100 MCG/2ML IJ SOLN
INTRAMUSCULAR | Status: AC
  Filled 2016-09-24: qty 2

## 2016-09-24 MED ORDER — BIVALIRUDIN BOLUS VIA INFUSION - CUPID
INTRAVENOUS | Status: DC | PRN
  Administered 2016-09-24: 57 mg via INTRAVENOUS

## 2016-09-24 MED ORDER — IOPAMIDOL (ISOVUE-370) INJECTION 76%
INTRAVENOUS | Status: AC
  Filled 2016-09-24: qty 125

## 2016-09-24 MED ORDER — SODIUM CHLORIDE 0.9 % IV SOLN: 250.0000 mL | INTRAVENOUS | Status: DC | PRN

## 2016-09-24 MED ORDER — LIDOCAINE HCL (PF) 1 % IJ SOLN
INTRAMUSCULAR | Status: AC
  Filled 2016-09-24: qty 30

## 2016-09-24 MED ORDER — HEPARIN (PORCINE) IN NACL 2-0.9 UNIT/ML-% IJ SOLN
INTRAMUSCULAR | Status: DC | PRN
  Administered 2016-09-24: 1500 mL

## 2016-09-24 MED ORDER — NITROGLYCERIN 1 MG/10 ML FOR IR/CATH LAB
INTRA_ARTERIAL | Status: AC
  Filled 2016-09-24: qty 10

## 2016-09-24 MED ORDER — IOPAMIDOL (ISOVUE-370) INJECTION 76%
INTRAVENOUS | Status: DC | PRN
  Administered 2016-09-24: 75 mL via INTRA_ARTERIAL

## 2016-09-24 MED ORDER — ANGIOPLASTY BOOK
Freq: Once | Status: AC
  Administered 2016-09-24: 20:00:00
  Filled 2016-09-24: qty 1

## 2016-09-24 SURGICAL SUPPLY — 23 items
BALLN MOZEC 2.50X14 (BALLOONS) ×2
BALLN ~~LOC~~ MOZEC 3.0X23 (BALLOONS) ×2
BALLOON MOZEC 2.50X14 (BALLOONS) ×1 IMPLANT
BALLOON ~~LOC~~ MOZEC 3.0X23 (BALLOONS) ×1 IMPLANT
CABLE ADAPT CONN TEMP 6FT (ADAPTER) ×2 IMPLANT
CATH S G BIP PACING (SET/KITS/TRAYS/PACK) ×2 IMPLANT
CATH TURNPIKE LP 150CM (CATHETERS) ×2 IMPLANT
CROWN DIAMONDBACK CLASSIC 1.25 (BURR) ×2 IMPLANT
DEVICE WIRE ANGIOSEAL 6FR (Vascular Products) ×2 IMPLANT
GUIDE CATH RUNWAY 6FR AL 75 (CATHETERS) ×2 IMPLANT
GUIDEWIRE 3MM J TIP .035 145 (WIRE) ×2 IMPLANT
KIT ENCORE 26 ADVANTAGE (KITS) ×4 IMPLANT
KIT HEART LEFT (KITS) ×2 IMPLANT
LUBRICANT VIPERSLIDE CORONARY (MISCELLANEOUS) ×2 IMPLANT
PACK CARDIAC CATHETERIZATION (CUSTOM PROCEDURE TRAY) ×2 IMPLANT
SHEATH PINNACLE 5F 10CM (SHEATH) ×2 IMPLANT
SHEATH PINNACLE 6F 10CM (SHEATH) ×6 IMPLANT
STENT SYNERGY DES 2.75X38 (Permanent Stent) ×2 IMPLANT
TRANSDUCER W/STOPCOCK (MISCELLANEOUS) ×2 IMPLANT
TUBING CIL FLEX 10 FLL-RA (TUBING) ×2 IMPLANT
WIRE COUGAR XT STRL 300CM (WIRE) ×4 IMPLANT
WIRE PT2 MS 300CM (WIRE) ×2 IMPLANT
WIRE VIPER ADVANCE COR .012TIP (WIRE) ×2 IMPLANT

## 2016-09-24 NOTE — Care Management Note (Signed)
Case Management Note  Patient Details  Name: Emelia LoronGrover Karman MRN: [id number] Date of Birth: [birth year]  Subjective/Objective:  S/p coronary stent intervention, lives with wife, pta indep.  Will be on plavix and asa.  NCM will cont to follow for dc needs.                  Action/Plan:   Expected Discharge Date:                  Expected Discharge Plan:  Home/Self Care  In-House Referral:     Discharge planning Services  CM Consult  Post Acute Care Choice:    Choice offered to:     DME Arranged:    DME Agency:     HH Arranged:    HH Agency:     Status of Service:  Completed, signed off  If discussed at [employer] Length of Stay Meetings, dates discussed:    Additional Comments:  Leone Havenaylor, Zoria Rawlinson Clinton, RN 09/24/2016, 4:54 PM

## 2016-09-24 NOTE — Progress Notes (Signed)
     SUBJECTIVE: No chest pain or dyspnea.   Tele: sinus  BP 106/83   Pulse 75   Temp 97.7 F (36.5 C)   Resp 15   Ht 5' 6.5" (1.689 m)   Wt 167 lb 8 oz (76 kg)   SpO2 99%   BMI 26.63 kg/m   Intake/Output Summary (Last 24 hours) at 09/24/16 0803 Last data filed at 09/24/16 0500  Gross per 24 hour  Intake          2602.54 ml  Output             1525 ml  Net          1077.54 ml    PHYSICAL EXAM General: Well developed, well nourished, in no acute distress. Alert and oriented x 3.  Psych:  Good affect, responds appropriately Neck: No JVD. No masses noted.  Lungs: Clear bilaterally with no wheezes or rhonci noted.  Heart: RRR with no murmurs noted. Abdomen: Bowel sounds are present. Soft, non-tender.  Extremities: No lower extremity edema.   LABS: Basic Metabolic Panel:  Recent Labs  91/47/8210/10/28 1024  NA 139  K 3.9  CL 107  CO2 24  GLUCOSE 91  BUN 15  CREATININE 1.07  CALCIUM 9.9    Current Meds: . aspirin EC  81 mg Oral Daily  . calcium carbonate  1 tablet Oral BID  . clopidogrel  75 mg Oral Daily  . fenofibrate  160 mg Oral Daily  . folic acid  1 mg Oral BID  . Influenza vac split quadrivalent PF  0.5 mL Intramuscular Tomorrow-1000  . irbesartan  75 mg Oral Daily  . ketotifen  1 drop Both Eyes Daily  . montelukast  10 mg Oral Daily  . sodium chloride flush  3 mL Intravenous Q12H  . sodium chloride flush  3 mL Intravenous Q12H  . vitamin B-12  1,000 mcg Oral Daily     ASSESSMENT AND PLAN: 65 yo male with history of CKD, CAD with severe calcified mid to distal RCA stenosis.   1. CAD: Plans for orbital atherectomy of the heavily calcified RCA lesion today followed by stenting. Renal function is stable. He is on ASA, Plavix.      Verne CarrowChristopher McAlhany  11/13/20178:03 AM

## 2016-09-24 NOTE — Interval H&P Note (Signed)
History and Physical Interval Note:  09/24/2016 12:54 PM  Sean Schultz  has presented today for surgery, with the diagnosis of cad  The various methods of treatment have been discussed with the patient and family. After consideration of risks, benefits and other options for treatment, the patient has consented to  Procedure(s): Coronary Stent Intervention (N/A) as a surgical intervention .  The patient's history has been reviewed, patient examined, no change in status, stable for surgery.  I have reviewed the patient's chart and labs.  Questions were answered to the patient's satisfaction.    Cath Lab Visit (complete for each Cath Lab visit)  Clinical Evaluation Leading to the Procedure:   ACS: No.  Non-ACS:    Anginal Classification: CCS II  Anti-ischemic medical therapy: No Therapy  Non-Invasive Test Results: High-risk stress test findings: cardiac mortality >3%/year  Prior CABG: No previous CABG         Verne Carrowhristopher Saabir Blyth

## 2016-09-24 NOTE — H&P (View-Only) (Signed)
     SUBJECTIVE: No chest pain or dyspnea.   Tele: sinus  BP 106/83   Pulse 75   Temp 97.7 F (36.5 C)   Resp 15   Ht 5' 6.5" (1.689 m)   Wt 167 lb 8 oz (76 kg)   SpO2 99%   BMI 26.63 kg/m   Intake/Output Summary (Last 24 hours) at 09/24/16 0803 Last data filed at 09/24/16 0500  Gross per 24 hour  Intake          2602.54 ml  Output             1525 ml  Net          1077.54 ml    PHYSICAL EXAM General: Well developed, well nourished, in no acute distress. Alert and oriented x 3.  Psych:  Good affect, responds appropriately Neck: No JVD. No masses noted.  Lungs: Clear bilaterally with no wheezes or rhonci noted.  Heart: RRR with no murmurs noted. Abdomen: Bowel sounds are present. Soft, non-tender.  Extremities: No lower extremity edema.   LABS: Basic Metabolic Panel:  Recent Labs  09/23/16 1024  NA 139  K 3.9  CL 107  CO2 24  GLUCOSE 91  BUN 15  CREATININE 1.07  CALCIUM 9.9    Current Meds: . aspirin EC  81 mg Oral Daily  . calcium carbonate  1 tablet Oral BID  . clopidogrel  75 mg Oral Daily  . fenofibrate  160 mg Oral Daily  . folic acid  1 mg Oral BID  . Influenza vac split quadrivalent PF  0.5 mL Intramuscular Tomorrow-1000  . irbesartan  75 mg Oral Daily  . ketotifen  1 drop Both Eyes Daily  . montelukast  10 mg Oral Daily  . sodium chloride flush  3 mL Intravenous Q12H  . sodium chloride flush  3 mL Intravenous Q12H  . vitamin B-12  1,000 mcg Oral Daily     ASSESSMENT AND PLAN: 65 yo male with history of CKD, CAD with severe calcified mid to distal RCA stenosis.   1. CAD: Plans for orbital atherectomy of the heavily calcified RCA lesion today followed by stenting. Renal function is stable. He is on ASA, Plavix.      Easton Sivertson  11/13/20178:03 AM  

## 2016-09-25 ENCOUNTER — Encounter (HOSPITAL_COMMUNITY): Payer: Self-pay | Admitting: Cardiovascular Disease

## 2016-09-25 LAB — CBC
HCT: 36.9 % — ABNORMAL LOW (ref 39.0–52.0)
HEMOGLOBIN: 12.5 g/dL — AB (ref 13.0–17.0)
MCH: 31.8 pg (ref 26.0–34.0)
MCHC: 33.9 g/dL (ref 30.0–36.0)
MCV: 93.9 fL (ref 78.0–100.0)
Platelets: 183 10*3/uL (ref 150–400)
RBC: 3.93 MIL/uL — AB (ref 4.22–5.81)
RDW: 13.3 % (ref 11.5–15.5)
WBC: 5.2 10*3/uL (ref 4.0–10.5)

## 2016-09-25 LAB — BASIC METABOLIC PANEL
ANION GAP: 6 (ref 5–15)
BUN: 14 mg/dL (ref 6–20)
CALCIUM: 9.7 mg/dL (ref 8.9–10.3)
CO2: 23 mmol/L (ref 22–32)
Chloride: 111 mmol/L (ref 101–111)
Creatinine, Ser: 1.04 mg/dL (ref 0.61–1.24)
Glucose, Bld: 94 mg/dL (ref 65–99)
POTASSIUM: 4.6 mmol/L (ref 3.5–5.1)
Sodium: 140 mmol/L (ref 135–145)

## 2016-09-25 MED ORDER — METOPROLOL TARTRATE 25 MG PO TABS: 25.0000 mg | ORAL_TABLET | Freq: Two times a day (BID) | ORAL | 6 refills | Status: DC

## 2016-09-25 MED ORDER — NITROGLYCERIN 0.4 MG SL SUBL: 0.4000 mg | SUBLINGUAL_TABLET | SUBLINGUAL | 3 refills | Status: DC | PRN

## 2016-09-25 MED ORDER — CLOPIDOGREL BISULFATE 75 MG PO TABS: 75.0000 mg | ORAL_TABLET | Freq: Every day | ORAL | 11 refills | Status: DC

## 2016-09-25 MED ORDER — METOPROLOL TARTRATE 25 MG PO TABS
25.0000 mg | ORAL_TABLET | Freq: Two times a day (BID) | ORAL | Status: DC
  Administered 2016-09-25: 25 mg via ORAL
  Filled 2016-09-25: qty 1

## 2016-09-25 NOTE — Progress Notes (Signed)
     SUBJECTIVE: No chest pain or dyspnea.   Tele: sinus  BP 130/74   Pulse 71   Temp 97.8 F (36.6 C) (Oral)   Resp 15   Ht 5' 6.5" (1.689 m)   Wt 165 lb 5.5 oz (75 kg)   SpO2 97%   BMI 26.29 kg/m   Intake/Output Summary (Last 24 hours) at 09/25/16 0721 Last data filed at 09/25/16 0153  Gross per 24 hour  Intake           986.25 ml  Output             1145 ml  Net          -158.75 ml    PHYSICAL EXAM General: Well developed, well nourished, in no acute distress. Alert and oriented x 3.  Psych:  Good affect, responds appropriately Neck: No JVD. No masses noted.  Lungs: Clear bilaterally with no wheezes or rhonci noted.  Heart: RRR with no murmurs noted. Abdomen: Bowel sounds are present. Soft, non-tender.  Extremities: No lower extremity edema.   LABS: Basic Metabolic Panel:  Recent Labs  40/98/1110/10/28 1024 09/25/16 0301  NA 139 140  K 3.9 4.6  CL 107 111  CO2 24 23  GLUCOSE 91 94  BUN 15 14  CREATININE 1.07 1.04  CALCIUM 9.9 9.7   CBC:  Recent Labs  09/25/16 0301  WBC 5.2  HGB 12.5*  HCT 36.9*  MCV 93.9  PLT 183   Current Meds: . aspirin EC  81 mg Oral Daily  . calcium carbonate  1 tablet Oral BID  . clopidogrel  75 mg Oral Daily  . fenofibrate  160 mg Oral Daily  . folic acid  1 mg Oral BID  . Influenza vac split quadrivalent PF  0.5 mL Intramuscular Tomorrow-1000  . irbesartan  75 mg Oral Daily  . ketotifen  1 drop Both Eyes Daily  . montelukast  10 mg Oral Daily  . sodium chloride flush  3 mL Intravenous Q12H  . sodium chloride flush  3 mL Intravenous Q12H  . vitamin B-12  1,000 mcg Oral Daily     ASSESSMENT AND PLAN: 65 yo male with history of CKD, CAD with severe calcified mid to distal RCA stenosis.   1. CAD: Pt admitted following diagnostic cath last week which showed severe calcific stenosis in the mid RCA. Given his CKD, he was hydrated post cath and plans were made for atherectomy yesterday. He remained in the hospital for  hydration. Successful PTCA/Rotational atherectomy yesterday of the mid RCA with placement Synergy DES in the RCA. Will continue ASA and Plavix for one year. Will start Lopressor 25 mg po BID. Will not start statin as he has had elevated LFTs in the past with statin therapy. Renal function is stable.   Discharge home today. Follow up with me as planned in 3 weeks.   Verne CarrowChristopher Clement Deneault  11/14/20177:21 AM

## 2016-09-25 NOTE — Discharge Summary (Signed)
Discharge Summary    Patient ID: Sean Schultz,  MRN: [id number], DOB/AGE: October 30, 1951 65 y.o.  Admit date: 09/21/2016 Discharge date: 09/25/2016  Primary Care Provider: [name] Primary Cardiologist: Dr. Donnie Aho   Discharge Diagnoses    Active Problems:   Abnormal stress test   Coronary artery disease involving native coronary artery of native heart   CAD in native artery   Allergies Allergies  Allergen Reactions  . Penicillins Swelling and Other (See Comments)    Dyspnea-childhood allergy Has patient had a PCN reaction causing immediate rash, facial/tongue/throat swelling, SOB or lightheadedness with hypotension:unsure Has patient had a PCN reaction causing severe rash involving mucus membranes or skin necrosis:unsure Has patient had a PCN reaction that required hospitalization:unsure Has patient had a PCN reaction occurring within the last 10 years:No If all of the above answers are "NO", then may proceed with Cephalosporin use.      Diagnostic Studies/Procedures    LHC: 09/21/16  Conclusion     Mid RCA-1 lesion, 60 %stenosed.  Mid RCA-2 lesion, 99 %stenosed.  Ost LAD to Prox LAD lesion, 40 %stenosed.  Mid LAD lesion, 20 %stenosed.  Dist LAD lesion, 40 %stenosed.  Ost 1st Diag to 1st Diag lesion, 30 %stenosed.   1. 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 Moderate calcific stenosis proximal and distal LAD  Recommendations: Will plan orbital atherectomy of the severe calcified stenosis in the mid to distal RCA. Will plan to admit over the weekend for hydration given his chronic kidney disease. Will use femoral artery access for PCI/atherectomy on Monday. Will load with Plavix today. Medical management of non-obstructive disease in the LAD.    LHC: 09/24/16  Conclusion     Mid RCA-2 lesion, 99 %stenosed.  A STENT SYNERGY DES [id number] drug eluting stent was successfully placed.  Mid RCA-1 lesion, 60 %stenosed.  Post  intervention, there is a 0% residual stenosis.   1. Severe calcified mid to distal RCA stenosis.  2. Successful PTCA/atherectomy/DES placement mid to distal RCA  Recommendations: Continue DAPT with ASA and Plavix for at least one year. Continue statin. Consider beta blocker therapy if BP tolerates.    _____________   History of Present Illness     65 yo male with history of HTN, HLD, GERD who presented as a new patient in the office to discuss his abnormal stress test. He was seen recently by Dr. Donnie Aho in [facility]. He has had a recent appendectomy and CT scan of the abdomen suggested vascular calcification. He was then referred to Dr. Donnie Aho. He had no angina type pains. He has been intolerant of statins. No known CAD but he does have a family history of CAD. He underwent exercise stress testing 09/04/16 which suggested ischemia. He was then referred for a nuclear stress test on 09/14/16 which showed a reversible defect in the inferior wall concerning for ischemia, LVEF=61%. He saw Dr. Clifton James to discuss his abnormal stress test, as Dr. Donnie Aho is not in the office over the next several weeks. Denied any chest pain or dyspnea. No LE edema. No palpitations. Given his abnormal stress test and strong family history, plan was made to undergo cardiac catheterization.    Hospital Course     Consultants: None  He presented on 11/10 for LHC with Dr. Clifton James showing severe calcific stenosis in the mid to distal RCA, along with moderate calcific stenosis in prox to distal LAD. Plan was made to proceed with orbital atherectomy of disease to  RCA in staged procedure. He was admitted and hydrated over the weekend with plans for repeat cath on 11/13.  Cr was 1.4 on admission, but improved to 1.07 prior to repeat cath. He was loaded with plavix post procedure.   On 11/13 he underwent staged LHC with atherectomy and DES placement in mid to distal RCA. Plan to continue DAPT with ASA and Plavix for at  least one year. Not on statin as his LFTs have been elevated in the past with statin therapy. His labs the following morning were stable. He reported feeling well post procedure.   He was seen and assessed by Dr. Clifton James on 11/14 and determined stable for discharge home. Follow up has been arranged in the office.  ___________  Discharge Vitals Blood pressure (!) 147/82, pulse 92, temperature 97 F (36.1 C), temperature source Oral, resp. rate 13, height 5' 6.5" (1.689 m), weight 165 lb 5.5 oz (75 kg), SpO2 100 %.  Filed Weights   09/23/16 0500 09/24/16 0500 09/25/16 0520  Weight: 168 lb 8 oz (76.4 kg) 167 lb 8 oz (76 kg) 165 lb 5.5 oz (75 kg)    Labs & Radiologic Studies    CBC  Recent Labs  09/25/16 0301  WBC 5.2  HGB 12.5*  HCT 36.9*  MCV 93.9  PLT 183   Basic Metabolic Panel  Recent Labs  09/23/16 1024 09/25/16 0301  NA 139 140  K 3.9 4.6  CL 107 111  CO2 24 23  GLUCOSE 91 94  BUN 15 14  CREATININE 1.07 1.04  CALCIUM 9.9 9.7   Liver Function Tests No results for input(s): AST, ALT, ALKPHOS, BILITOT, PROT, ALBUMIN in the last 72 hours. No results for input(s): LIPASE, AMYLASE in the last 72 hours. Cardiac Enzymes No results for input(s): CKTOTAL, CKMB, CKMBINDEX, TROPONINI in the last 72 hours. BNP Invalid input(s): POCBNP D-Dimer No results for input(s): DDIMER in the last 72 hours. Hemoglobin A1C No results for input(s): HGBA1C in the last 72 hours. Fasting Lipid Panel No results for input(s): CHOL, HDL, LDLCALC, TRIG, CHOLHDL, LDLDIRECT in the last 72 hours. Thyroid Function Tests No results for input(s): TSH, T4TOTAL, T3FREE, THYROIDAB in the last 72 hours.  Invalid input(s): FREET3 _____________  Nm Myocar Multi W/spect W/wall Motion / Ef  Result Date: 09/14/2016 CLINICAL DATA:  Hypertension and coronary artery disease EXAM: MYOCARDIAL IMAGING WITH SPECT (REST AND EXERCISE) GATED LEFT VENTRICULAR WALL MOTION STUDY LEFT VENTRICULAR EJECTION FRACTION  TECHNIQUE: Standard myocardial SPECT imaging was performed after resting intravenous injection of 10 mCi Tc-4m tetrofosmin. Subsequently, exercise tolerance test was performed by the patient under the supervision of the Cardiology staff. At peak-stress, 30 mCi Tc-44m tetrofosmin was injected intravenously and standard myocardial SPECT imaging was performed. Quantitative gated imaging was also performed to evaluate left ventricular wall motion, and estimate left ventricular ejection fraction. COMPARISON:  None. FINDINGS: Perfusion: Moderate size defect is noted involving the apical segment of the lateral and inferior lateral wall as well as the mid and basilar segments of the inferior wall and inferior lateral wall. Summed difference score equals 8. Wall Motion: Inferior wall hypokinesis identified. Left Ventricular Ejection Fraction: 61 % End diastolic volume 85 ml End systolic volume 34 ml IMPRESSION: 1. Reversible ischemia identified within the inferior lateral and inferior wall. 2. Inferior wall hypokinesis. 3. Left ventricular ejection fraction 61% 4. Non invasive risk stratification*: High *2012 Appropriate Use Criteria for Coronary Revascularization Focused Update: J Am Coll Cardiol. 2012;59(9):857-881. http://content.dementiazones.com.aspx?articleid=[email or web address] Electronically Signed  By: Signa Kellaylor  Stroud M.D.   On: 09/14/2016 13:36   Disposition   Pt is being discharged home today in good condition.  Follow-up Plans & Appointments    Follow-up Information    Verne Carrowhristopher McAlhany, MD Follow up on 10/19/2016.   Specialty:  Cardiology Why:  at 9:15am for your follow up appt.  Contact information: [address] [place] [address] [phone]          Discharge Instructions    Call MD for:  redness, tenderness, or signs of infection (pain, swelling, redness, odor or green/yellow discharge around incision site)    Complete by:  As directed    Diet - low sodium heart  healthy    Complete by:  As directed    Discharge instructions    Complete by:  As directed    Groin Site Care Refer to this sheet in the next few weeks. These instructions provide you with information on caring for yourself after your procedure. Your caregiver may also give you more specific instructions. Your treatment has been planned according to current medical practices, but problems sometimes occur. Call your caregiver if you have any problems or questions after your procedure. HOME CARE INSTRUCTIONS You may shower 24 hours after the procedure. Remove the bandage (dressing) and gently wash the site with plain soap and water. Gently pat the site dry.  Do not apply powder or lotion to the site.  Do not sit in a bathtub, swimming pool, or whirlpool for 5 to 7 days.  No bending, squatting, or lifting anything over 10 pounds (4.5 kg) as directed by your caregiver.  Inspect the site at least twice daily.  Do not drive home if you are discharged the same day of the procedure. Have someone else drive you.  You may drive 24 hours after the procedure unless otherwise instructed by your caregiver.  What to expect: Any bruising will usually fade within 1 to 2 weeks.  Blood that collects in the tissue (hematoma) may be painful to the touch. It should usually decrease in size and tenderness within 1 to 2 weeks.  SEEK IMMEDIATE MEDICAL CARE IF: You have unusual pain at the groin site or down the affected leg.  You have redness, warmth, swelling, or pain at the groin site.  You have drainage (other than a small amount of blood on the dressing).  You have chills.  You have a fever or persistent symptoms for more than 72 hours.  You have a fever and your symptoms suddenly get worse.  Your leg becomes pale, cool, tingly, or numb.  You have heavy bleeding from the site. Hold pressure on the site. .   Increase activity slowly    Complete by:  As directed       Discharge Medications   Current  Discharge Medication List    START taking these medications   Details  clopidogrel (PLAVIX) 75 MG tablet Take 1 tablet (75 mg total) by mouth daily. Qty: 30 tablet, Refills: 11    metoprolol tartrate (LOPRESSOR) 25 MG tablet Take 1 tablet (25 mg total) by mouth 2 (two) times daily. Qty: 60 tablet, Refills: 6    nitroGLYCERIN (NITROSTAT) 0.4 MG SL tablet Place 1 tablet (0.4 mg total) under the tongue every 5 (five) minutes as needed. Qty: 25 tablet, Refills: 3      CONTINUE these medications which have NOT CHANGED   Details  aspirin EC 81 MG tablet Take 81 mg by mouth  daily.     azelastine (OPTIVAR) 0.05 % ophthalmic solution Place 1 drop into both eyes daily.  Refills: 0    calcium carbonate (TUMS - DOSED IN MG ELEMENTAL CALCIUM) 500 MG chewable tablet Chew 1 tablet by mouth 2 (two) times daily.    Coenzyme Q10 (CO Q 10 PO) Take 1 capsule by mouth 2 (two) times daily.    Cyanocobalamin (RA VITAMIN B-12 TR) 1000 MCG TBCR Take 1,000 mcg by mouth daily.     fenofibrate 160 MG tablet Take 160 mg by mouth daily. Refills: 0    folic acid (FOLVITE) 1 MG tablet Take 1 mg by mouth 2 (two) times daily.    Melatonin 3 MG TABS Take 3 mg by mouth at bedtime as needed (sleep).    montelukast (SINGULAIR) 10 MG tablet Take 10 mg by mouth daily.  Refills: 1    NIACIN PO Take 1 tablet by mouth 2 (two) times daily.    Omega-3 Fatty Acids (FISH OIL PO) Take 1 capsule by mouth daily.    Plant Sterol Stanol-Pantethine (CHOLESTOFF COMPLETE) 300-100 MG CAPS Take 2 capsules by mouth every evening.     valsartan (DIOVAN) 160 MG tablet Take 160 mg by mouth daily.  Refills: 1         Aspirin prescribed at discharge?  Yes High Intensity Statin Prescribed? (Lipitor 40-80mg  or Crestor 20-40mg ): No: Elevated LFTs in that past with statin Beta Blocker Prescribed? Yes For EF <40%, was ACEI/ARB Prescribed? Yes ADP Receptor Inhibitor Prescribed? (i.e. Plavix etc.-Includes Medically Managed  Patients): Yes For EF <40%, Aldosterone Inhibitor Prescribed? No: EF ok Was EF assessed during THIS hospitalization? Yes Was Cardiac Rehab II ordered? (Included Medically managed Patients): Yes   Outstanding Labs/Studies   None  Duration of Discharge Encounter   Greater than 30 minutes including physician time.  Signed, Laverda PageLindsay Nili Honda NP-C 09/25/2016, 8:00 AM

## 2016-09-25 NOTE — Progress Notes (Signed)
CARDIAC REHAB PHASE I   PRE:  Rate/Rhythm: 86 SR  BP:  Sitting: 147/82        SaO2: 98 RA  MODE:  Ambulation: 800 ft   POST:  Rate/Rhythm: 105 ST  BP:  Sitting: 164/82         SaO2: 100 RA  Pt ambulated 800 ft on RA, independent, steady gait, tolerated well with no complaints.  Completed PCI/stent education.  Reviewed risk factors, anti-platelet therapy, stent card, activity restrictions, ntg, exercise, heart healthy diet, and phase 2 cardiac rehab. Pt verbalized understanding, receptive to education. Pt agrees to phase 2 cardiac rehab referral, [facility] [place] [facility]. Pt to recliner after walk, call bell within reach. Will follow.   [phone] Sean GrapesEmily C Bosco Paparella, RN, BSN 09/25/2016 9:15 AM

## 2016-10-18 NOTE — Progress Notes (Signed)
Chief Complaint  Patient presents with  . Follow-up     History of Present Illness: 65 yo male with history of CAD, HTN, HLD, GERD who is here today for cardiac follow up. I met him 09/17/16 to discuss a cardiac cath. He had been seen before that by Dr. Wynonia Lawman in [facility]. He had a recent appendectomy and CT scan of the abdomen suggested vascular calcification. He was then referred to Dr. Wynonia Lawman. He underwent exercise stress testing 09/04/16 which suggested ischemia. He was then referred for a nuclear stress test on 09/14/16 which showed a reversible defect in the inferior wall concerning for ischemia, LVEF=61%. I arranged a cardiac cath on 09/21/16 which showed a calcified 99% mid RCA stenosis. I brought him back on 09/24/16 for [facility] orbital atherectomy of the RCA followed by placement of a Synergy DES x 1 in the mid RCA. He did well following the procedure.   He is here today follow up. He has no chest pain, dyspnea, palpitations, near syncope or syncope. No LE edema.   Primary Care Physician: Silvano Rusk, MD   Past Medical History:  Diagnosis Date  . BPH (benign prostatic hypertrophy)   . Coronary artery disease   . GERD (gastroesophageal reflux disease)   . H/O seasonal allergies   . Hyperlipemia   . Hypertension   . Prostatitis     Past Surgical History:  Procedure Laterality Date  . CARDIAC CATHETERIZATION N/A 09/21/2016   Procedure: Left Heart Cath and Coronary Angiography;  Surgeon: Burnell Blanks, MD;  Location: [place] [facility];  Service: Cardiovascular;  Laterality: N/A;  . CARDIAC CATHETERIZATION N/A 09/24/2016   Procedure: Coronary Stent Intervention;  Surgeon: Burnell Blanks, MD;  Location: [place] [facility];  Service: Cardiovascular;  Laterality: N/A;  . CARDIAC CATHETERIZATION N/A 09/24/2016   Procedure: Coronary/Graft Atherectomy;  Surgeon: Burnell Blanks, MD;  Location: [place] [facility];  Service: Cardiovascular;   Laterality: N/A;  . CARDIAC CATHETERIZATION N/A 09/24/2016   Procedure: Temporary Pacemaker;  Surgeon: Burnell Blanks, MD;  Location: [place] [facility];  Service: Cardiovascular;  Laterality: N/A;  . CARDIOVASCULAR STRESS TEST  09/14/2016  . COLONOSCOPY    . LAPAROSCOPIC APPENDECTOMY N/A 07/14/2016   Procedure: APPENDECTOMY LAPAROSCOPIC;  Surgeon: Alphonsa Overall, MD;  Location: [facility];  Service: General;  Laterality: N/A;  . TOTAL HIP ARTHROPLASTY    . WISDOM TOOTH EXTRACTION      Current Outpatient Prescriptions  Medication Sig Dispense Refill  . aspirin EC 81 MG tablet Take 81 mg by mouth daily.     [name] azelastine (OPTIVAR) 0.05 % ophthalmic solution Place 1 drop into both eyes daily.   0  . calcium carbonate (TUMS - DOSED IN MG ELEMENTAL CALCIUM) 500 MG chewable tablet Chew 1 tablet by mouth 2 (two) times daily.    . clopidogrel (PLAVIX) 75 MG tablet Take 1 tablet (75 mg total) by mouth daily. 30 tablet 11  . Coenzyme Q10 (CO Q 10 PO) Take 1 capsule by mouth 2 (two) times daily.    . Cyanocobalamin (RA VITAMIN B-12 TR) 1000 MCG TBCR Take 1,000 mcg by mouth daily.     . fenofibrate 160 MG tablet Take 160 mg by mouth daily.  0  . folic acid (FOLVITE) 1 MG tablet Take 1 mg by mouth 2 (two) times daily.    . Melatonin 3 MG TABS Take 3 mg by mouth at bedtime as needed (sleep).    . metoprolol  tartrate (LOPRESSOR) 25 MG tablet Take 1 tablet (25 mg total) by mouth 2 (two) times daily. 60 tablet 6  . montelukast (SINGULAIR) 10 MG tablet Take 10 mg by mouth daily.   1  . NIACIN PO Take 1 tablet by mouth 2 (two) times daily.    . nitroGLYCERIN (NITROSTAT) 0.4 MG SL tablet Place 1 tablet (0.4 mg total) under the tongue every 5 (five) minutes as needed. 25 tablet 3  . Omega-3 Fatty Acids (FISH OIL PO) Take 1 capsule by mouth daily.    [name] Plant Sterol Stanol-Pantethine (CHOLESTOFF COMPLETE) 300-100 MG CAPS Take 2 capsules by mouth every evening.     . valsartan (DIOVAN) 160 MG tablet Take 160 mg  by mouth daily.   1   No current facility-administered medications for this visit.     Allergies  Allergen Reactions  . Penicillins Swelling and Other (See Comments)    Dyspnea-childhood allergy Has patient had a PCN reaction causing immediate rash, facial/tongue/throat swelling, SOB or lightheadedness with hypotension:unsure Has patient had a PCN reaction causing severe rash involving mucus membranes or skin necrosis:unsure Has patient had a PCN reaction that required hospitalization:unsure Has patient had a PCN reaction occurring within the last 10 years:No If all of the above answers are "NO", then may proceed with Cephalosporin use.      Social History   Social History  . Marital status: Married    Spouse name: N/A  . Number of children: 2  . Years of education: N/A   Occupational History  . sales    Social History Main Topics  . Smoking status: Former Smoker    Packs/day: 0.50    Years: 12.00    Types: Cigarettes    Quit date: 11/17/1978  . Smokeless tobacco: Never Used  . Alcohol use 0.6 oz/week    1 Glasses of wine per week  . Drug use: No  . Sexual activity: Not on file   Other Topics Concern  . Not on file   Social History Narrative  . No narrative on file    Family History  Problem Relation Age of Onset  . Coronary artery disease Mother 4  . CVA Mother   . Heart attack Father 5  . Microcephaly Paternal Uncle   . Heart attack Paternal Uncle     Review of Systems:  As stated in the HPI and otherwise negative.   BP 124/80   Pulse 71   Ht '5\' 7"'  (1.702 m)   Wt 175 lb 12.8 oz (79.7 kg)   SpO2 98%   BMI 27.53 kg/m   Physical Examination: General: Well developed, well nourished, NAD  HEENT: OP clear, mucus membranes moist  SKIN: warm, dry. No rashes. Neuro: No focal deficits  Musculoskeletal: Muscle strength 5/5 all ext  Psychiatric: Mood and affect normal  Neck: No JVD, no carotid bruits, no thyromegaly, no lymphadenopathy.  Lungs:Clear  bilaterally, no wheezes, rhonci, crackles Cardiovascular: Regular rate and rhythm. No murmurs, gallops or rubs. Abdomen:Soft. Bowel sounds present. Non-tender.  Extremities: No lower extremity edema. Pulses are 2 + in the bilateral DP/PT.  EKG:  EKG is not  ordered today. The ekg ordered today demonstrates   Recent Labs: 07/14/2016: ALT 29 09/25/2016: BUN 14; Creatinine, Ser 1.04; Hemoglobin 12.5; Platelets 183; Potassium 4.6; Sodium 140   Lipid Panel No results found for: CHOL, TRIG, HDL, CHOLHDL, VLDL, LDLCALC, LDLDIRECT   Wt Readings from Last 3 Encounters:  10/19/16 175 lb 12.8 oz (79.7 kg)  09/25/16 165 lb 5.5 oz (75 kg)  09/17/16 173 lb 6.4 oz (78.7 kg)     Other studies Reviewed: Additional studies/ records that were reviewed today include: . Review of the above records demonstrates:   Assessment and Plan:   1. CAD without angina: He is stable following recent PCI/atherectomy/stenting of the mid RCA with a DES in November 2017. He is on ASA, Plavix, beta blocker, ARB.  He is not on a statin as he has had elevated LFTs in the past with statin therapy.   Current medicines are reviewed at length with the patient today.  The patient does not have concerns regarding medicines.  The following changes have been made:  no change  Labs/ tests ordered today include:   No orders of the defined types were placed in this encounter.    Disposition:   FU with me in 6 months   Signed, Lauree Chandler, MD 10/19/2016 10:13 AM    [address], [place], [place]  [address] Phone: [phone]; Fax: [phone]

## 2016-10-19 ENCOUNTER — Ambulatory Visit (INDEPENDENT_AMBULATORY_CARE_PROVIDER_SITE_OTHER): Payer: No Typology Code available for payment source | Admitting: Cardiovascular Disease

## 2016-10-19 ENCOUNTER — Encounter: Payer: Self-pay | Admitting: Cardiovascular Disease

## 2016-10-19 VITALS — BP 124/80 | HR 71 | Ht 67.0 in | Wt 175.8 lb

## 2016-10-19 DIAGNOSIS — I2510 Atherosclerotic heart disease of native coronary artery without angina pectoris: Secondary | ICD-10-CM | POA: Diagnosis not present

## 2016-10-19 NOTE — Patient Instructions (Signed)
Medication Instructions:  Your physician recommends that you continue on your current medications as directed. Please refer to the Current Medication list given to you today.   Labwork: none  Testing/Procedures: none  Follow-Up: Your physician recommends that you schedule a follow-up appointment in: 6 months.  Please call our office in about 4 months to schedule this appointment.     Any Other Special Instructions Will Be Listed Below (If Applicable).     If you need a refill on your cardiac medications before your next appointment, please call your pharmacy.

## 2016-11-01 ENCOUNTER — Encounter (HOSPITAL_COMMUNITY): Payer: Self-pay

## 2016-11-01 ENCOUNTER — Encounter (HOSPITAL_COMMUNITY)
Admission: RE | Admit: 2016-11-01 | Discharge: 2016-11-01 | Disposition: A | Discharge status: Home / Self Care | Payer: No Typology Code available for payment source | Source: Ambulatory Visit | Attending: Cardiovascular Disease | Admitting: Cardiovascular Disease

## 2016-11-01 VITALS — BP 122/76 | HR 63 | Ht 66.5 in | Wt 177.0 lb

## 2016-11-01 DIAGNOSIS — Z955 Presence of coronary angioplasty implant and graft: Secondary | ICD-10-CM | POA: Insufficient documentation

## 2016-11-01 NOTE — Progress Notes (Signed)
Cardiac Individual Treatment Plan  Patient Details  Name: Sean Schultz MRN: [id number] Date of Birth: [birth year] Referring Provider:   Flowsheet Row CARDIAC REHAB PHASE II ORIENTATION from 11/01/2016 in [facility] CARDIAC REHAB  Referring Provider  Earney Hamburg MD      Initial Encounter Date:  Flowsheet Row CARDIAC REHAB PHASE II ORIENTATION from 11/01/2016 in [facility] CARDIAC REHAB  Date  11/01/16  Referring Provider  Earney Hamburg MD      Visit Diagnosis: 09/24/16 Status post coronary artery stent placement  Patient's Home Medications on Admission:  Current Outpatient Prescriptions:  .  aspirin EC 81 MG tablet, Take 81 mg by mouth daily. , Disp: , Rfl:  .  azelastine (OPTIVAR) 0.05 % ophthalmic solution, Place 1 drop into both eyes daily. , Disp: , Rfl: 0 .  calcium carbonate (TUMS - DOSED IN MG ELEMENTAL CALCIUM) 500 MG chewable tablet, Chew 1 tablet by mouth 2 (two) times daily., Disp: , Rfl:  .  clopidogrel (PLAVIX) 75 MG tablet, Take 1 tablet (75 mg total) by mouth daily., Disp: 30 tablet, Rfl: 11 .  Coenzyme Q10 (CO Q 10 PO), Take 1 capsule by mouth 2 (two) times daily., Disp: , Rfl:  .  Cyanocobalamin (RA VITAMIN B-12 TR) 1000 MCG TBCR, Take 1,000 mcg by mouth daily. , Disp: , Rfl:  .  fenofibrate 160 MG tablet, Take 160 mg by mouth daily., Disp: , Rfl: 0 .  fluticasone (VERAMYST) 27.5 MCG/SPRAY nasal spray, Place 1 spray into the nose 2 (two) times daily., Disp: , Rfl:  .  folic acid (FOLVITE) 1 MG tablet, Take 1 mg by mouth 2 (two) times daily., Disp: , Rfl:  .  Melatonin 3 MG TABS, Take 3 mg by mouth at bedtime as needed (sleep)., Disp: , Rfl:  .  metoprolol tartrate (LOPRESSOR) 25 MG tablet, Take 1 tablet (25 mg total) by mouth 2 (two) times daily., Disp: 60 tablet, Rfl: 6 .  montelukast (SINGULAIR) 10 MG tablet, Take 10 mg by mouth daily. , Disp: , Rfl: 1 .  NIACIN PO, Take 1 tablet by mouth daily. , Disp: , Rfl:  .   nitroGLYCERIN (NITROSTAT) 0.4 MG SL tablet, Place 1 tablet (0.4 mg total) under the tongue every 5 (five) minutes as needed., Disp: 25 tablet, Rfl: 3 .  Omega-3 Fatty Acids (FISH OIL PO), Take 1 capsule by mouth daily., Disp: , Rfl:  .  Plant Sterol Stanol-Pantethine (CHOLESTOFF COMPLETE) 300-100 MG CAPS, Take 2 capsules by mouth every evening. , Disp: , Rfl:  .  valsartan (DIOVAN) 160 MG tablet, Take 160 mg by mouth daily. , Disp: , Rfl: 1  Past Medical History: Past Medical History:  Diagnosis Date  . BPH (benign prostatic hypertrophy)   . Coronary artery disease   . GERD (gastroesophageal reflux disease)   . H/O seasonal allergies   . Hyperlipemia   . Hypertension   . Prostatitis     Tobacco Use: History  Smoking Status  . Former Smoker  . Packs/day: 0.50  . Years: 12.00  . Types: Cigarettes  . Quit date: 11/17/1978  Smokeless Tobacco  . Never Used    Labs: Recent Review Flowsheet Data    There is no flowsheet data to display.      Capillary Blood Glucose: Lab Results  Component Value Date   GLUCAP 130 (H) 07/14/2016     Exercise Target Goals: Date: 11/01/16  Exercise Program Goal: Individual exercise prescription set with THRR,  safety & activity barriers. Participant demonstrates ability to understand and report RPE using BORG scale, to self-measure pulse accurately, and to acknowledge the importance of the exercise prescription.  Exercise Prescription Goal: Starting with aerobic activity 30 plus minutes a day, 3 days per week for initial exercise prescription. Provide home exercise prescription and guidelines that participant acknowledges understanding prior to discharge.  Activity Barriers & Risk Stratification:     Activity Barriers & Cardiac Risk Stratification - 11/01/16 0850      Activity Barriers & Cardiac Risk Stratification   Activity Barriers Other (comment)   Comments R artificial hip   Cardiac Risk Stratification High      6 Minute Walk:      6 Minute Walk    Row Name 11/01/16 0950 11/01/16 1004       6 Minute Walk   Phase Initial  -    Distance 1552 feet  -    Walk Time 6 minutes  -    # of Rest Breaks 0  -    MPH  - 2.9    METS  - 3.3    RPE 19  -    VO2 Peak  - 11.7    Symptoms No  -    Resting HR 63 bpm  -    Resting BP 122/76  -    Max Ex. HR 87 bpm  -    Max Ex. BP 132/76  -    2 Minute Post BP 124/82  -       Initial Exercise Prescription:     Initial Exercise Prescription - 11/01/16 1000      Date of Initial Exercise RX and Referring Provider   Date 11/01/16   Referring Provider Earney Hamburg MD     Bike   Level 0.8   Minutes 10   METs 2.89     NuStep   Level 2   Minutes 10   METs 2.3     Track   Laps 11   Minutes 10   METs 2.92     Prescription Details   Frequency (times per week) 3   Duration Progress to 45 minutes of aerobic exercise without signs/symptoms of physical distress     Intensity   THRR 40-80% of Max Heartrate 61-122   Ratings of Perceived Exertion 11-13   Perceived Dyspnea 0-4     Progression   Progression Continue to progress workloads to maintain intensity without signs/symptoms of physical distress.     Resistance Training   Training Prescription Yes   Weight 3   Reps 10-12      Perform Capillary Blood Glucose checks as needed.  Exercise Prescription Changes:   Exercise Comments:   Discharge Exercise Prescription (Final Exercise Prescription Changes):   Nutrition:  Target Goals: Understanding of nutrition guidelines, daily intake of sodium 1500mg , cholesterol 200mg , calories 30% from fat and 7% or less from saturated fats, daily to have 5 or more servings of fruits and vegetables.  Biometrics:     Pre Biometrics - 11/01/16 1028      Pre Biometrics   % Body Fat 25.8 %       Nutrition Therapy Plan and Nutrition Goals:   Nutrition Discharge: Nutrition Scores:   Nutrition Goals Re-Evaluation:   Psychosocial: Target Goals:  Acknowledge presence or absence of depression, maximize coping skills, provide positive support system. Participant is able to verbalize types and ability to use techniques and skills needed for reducing stress and depression.  Initial Review & Psychosocial Screening:     Initial Psych Review & Screening - 11/01/16 1452      Family Dynamics   Comments brief psychosocial assesment reeveals no identifiable needs.     Barriers   Psychosocial barriers to participate in program There are no identifiable barriers or psychosocial needs.     Screening Interventions   Interventions Encouraged to exercise      Quality of Life Scores:     Quality of Life - 11/01/16 1036      Quality of Life Scores   Health/Function Pre 26.89 %   Socioeconomic Pre 27.43 %   Psych/Spiritual Pre 28.29 %   Family Pre 28.8 %   GLOBAL Pre 27.59 %      PHQ-9: Recent Review Flowsheet Data    There is no flowsheet data to display.      Psychosocial Evaluation and Intervention:   Psychosocial Re-Evaluation:   Vocational Rehabilitation: Provide vocational rehab assistance to qualifying candidates.   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      Education: Education Goals: Education classes will be provided on a weekly basis, covering required topics. Participant will state understanding/return demonstration of topics presented.  Learning Barriers/Preferences:   Education Topics: Count Your Pulse:  -Group instruction provided by verbal instruction, demonstration, patient participation and written materials to support subject.  Instructors address importance of being able to find your pulse and how to count your pulse when at home without a  heart monitor.  Patients get hands on experience counting their pulse with staff help and individually.   Heart Attack, Angina, and Risk Factor Modification:  -Group instruction provided by verbal instruction, video, and written materials to support subject.  Instructors address signs and symptoms of angina and heart attacks.    Also discuss risk factors for heart disease and how to make changes to improve heart health risk factors.   Functional Fitness:  -Group instruction provided by verbal instruction, demonstration, patient participation, and written materials to support subject.  Instructors address safety measures for doing things around the house.  Discuss how to get up and down off the floor, how to pick things up properly, how to safely get out of a chair without assistance, and balance training.   Meditation and Mindfulness:  -Group instruction provided by verbal instruction, patient participation, and written materials to support subject.  Instructor addresses importance of mindfulness and meditation practice to help reduce stress and improve awareness.  Instructor also leads participants through a meditation exercise.    Stretching for Flexibility and Mobility:  -Group instruction provided by verbal instruction, patient participation, and written materials to support subject.  Instructors lead participants through series of stretches that are designed to increase flexibility thus improving mobility.  These stretches are additional exercise for major muscle groups that are typically performed during regular warm up and cool down.   Hands Only CPR Anytime:  -Group instruction provided by verbal instruction, video, patient participation and written materials to support subject.  Instructors co-teach with AHA video for hands only CPR.  Participants get hands on experience with mannequins.   Nutrition I class: Heart Healthy Eating:  -Group instruction provided by PowerPoint slides,  verbal discussion,  and written materials to support subject matter. The instructor gives an explanation and review of the Therapeutic Lifestyle Changes diet recommendations, which includes a discussion on lipid goals, dietary fat, sodium, fiber, plant stanol/sterol esters, sugar, and the components of a well-balanced, healthy diet.   Nutrition II class: Lifestyle Skills:  -Group instruction provided by PowerPoint slides, verbal discussion, and written materials to support subject matter. The instructor gives an explanation and review of label reading, grocery shopping for heart health, heart healthy recipe modifications, and ways to make healthier choices when eating out.   Diabetes Question & Answer:  -Group instruction provided by PowerPoint slides, verbal discussion, and written materials to support subject matter. The instructor gives an explanation and review of diabetes co-morbidities, pre- and post-prandial blood glucose goals, pre-exercise blood glucose goals, signs, symptoms, and treatment of hypoglycemia and hyperglycemia, and foot care basics.   Diabetes Blitz:  -Group instruction provided by PowerPoint slides, verbal discussion, and written materials to support subject matter. The instructor gives an explanation and review of the physiology behind type 1 and type 2 diabetes, diabetes medications and rational behind using different medications, pre- and post-prandial blood glucose recommendations and Hemoglobin A1c goals, diabetes diet, and exercise including blood glucose guidelines for exercising safely.    Portion Distortion:  -Group instruction provided by PowerPoint slides, verbal discussion, written materials, and food models to support subject matter. The instructor gives an explanation of serving size versus portion size, changes in portions sizes over the last 20 years, and what consists of a serving from each food group.   Stress Management:  -Group instruction provided by  verbal instruction, video, and written materials to support subject matter.  Instructors review role of stress in heart disease and how to cope with stress positively.     Exercising on Your Own:  -Group instruction provided by verbal instruction, power point, and written materials to support subject.  Instructors discuss benefits of exercise, components of exercise, frequency and intensity of exercise, and end points for exercise.  Also discuss use of nitroglycerin and activating EMS.  Review options of places to exercise outside of rehab.  Review guidelines for sex with heart disease.   Cardiac Drugs I:  -Group instruction provided by verbal instruction and written materials to support subject.  Instructor reviews cardiac drug classes: antiplatelets, anticoagulants, beta blockers, and statins.  Instructor discusses reasons, side effects, and lifestyle considerations for each drug class.   Cardiac Drugs II:  -Group instruction provided by verbal instruction and written materials to support subject.  Instructor reviews cardiac drug classes: angiotensin converting enzyme inhibitors (ACE-I), angiotensin II receptor blockers (ARBs), nitrates, and calcium channel blockers.  Instructor discusses reasons, side effects, and lifestyle considerations for each drug class.   Anatomy and Physiology of the Circulatory System:  -Group instruction provided by verbal instruction, video, and written materials to support subject.  Reviews functional anatomy of heart, how it relates to various diagnoses, and what role the heart plays in the overall system.   Knowledge Questionnaire Score:     Knowledge Questionnaire Score - 11/01/16 0947      Knowledge Questionnaire Score   Pre Score 18/24      Core Components/Risk Factors/Patient Goals at Admission:     Personal Goals and Risk Factors at Admission - 11/01/16 0850      Core Components/Risk Factors/Patient Goals on Admission    Weight Management  Yes;Weight Loss   Intervention Weight Management: Develop a combined nutrition and exercise program designed to reach  desired caloric intake, while maintaining appropriate intake of nutrient and fiber, sodium and fats, and appropriate energy expenditure required for the weight goal.;Weight Management: Provide education and appropriate resources to help participant work on and attain dietary goals.;Weight Management/Obesity: Establish reasonable short term and long term weight goals.;Obesity: Provide education and appropriate resources to help participant work on and attain dietary goals.   Expected Outcomes Short Term: Continue to assess and modify interventions until short term weight is achieved;Long Term: Adherence to nutrition and physical activity/exercise program aimed toward attainment of established weight goal;Weight Maintenance: Understanding of the daily nutrition guidelines, which includes 25-35% calories from fat, 7% or less cal from saturated fats, less than 200mg  cholesterol, less than 1.5gm of sodium, & 5 or more servings of fruits and vegetables daily;Weight Loss: Understanding of general recommendations for a balanced deficit meal plan, which promotes 1-2 lb weight loss per week and includes a negative energy balance of [phone] kcal/d;Understanding recommendations for meals to include 15-35% energy as protein, 25-35% energy from fat, 35-60% energy from carbohydrates, less than 200mg  of dietary cholesterol, 20-35 gm of total fiber daily;Understanding of distribution of calorie intake throughout the day with the consumption of 4-5 meals/snacks   Sedentary Yes   Intervention Provide advice, education, support and counseling about physical activity/exercise needs.;Develop an individualized exercise prescription for aerobic and resistive training based on initial evaluation findings, risk stratification, comorbidities and participant's personal goals.   Expected Outcomes Achievement of increased  cardiorespiratory fitness and enhanced flexibility, muscular endurance and strength shown through measurements of functional capacity and personal statement of participant.   Increase Strength and Stamina Yes   Intervention Develop an individualized exercise prescription for aerobic and resistive training based on initial evaluation findings, risk stratification, comorbidities and participant's personal goals.;Provide advice, education, support and counseling about physical activity/exercise needs.   Expected Outcomes Achievement of increased cardiorespiratory fitness and enhanced flexibility, muscular endurance and strength shown through measurements of functional capacity and personal statement of participant.   Improve shortness of breath with ADL's Yes   Intervention Provide education, individualized exercise plan and daily activity instruction to help decrease symptoms of SOB with activities of daily living.   Expected Outcomes Short Term: Achieves a reduction of symptoms when performing activities of daily living.   Hypertension Yes   Intervention Provide education on lifestyle modifcations including regular physical activity/exercise, weight management, moderate sodium restriction and increased consumption of fresh fruit, vegetables, and low fat dairy, alcohol moderation, and smoking cessation.;Monitor prescription use compliance.   Expected Outcomes Short Term: Continued assessment and intervention until BP is < 140/55mm HG in hypertensive participants. < 130/82mm HG in hypertensive participants with diabetes, heart failure or chronic kidney disease.;Long Term: Maintenance of blood pressure at goal levels.   Lipids Yes   Expected Outcomes Short Term: Participant states understanding of desired cholesterol values and is compliant with medications prescribed. Participant is following exercise prescription and nutrition guidelines.;Long Term: Cholesterol controlled with medications as prescribed,  with individualized exercise RX and with personalized nutrition plan. Value goals: LDL < 70mg , HDL > 40 mg.   Personal Goal Other Yes   Personal Goal Learn prevention or progression of CVD and be able to live longer and play with grandchildren   Intervention Provide cardiac education classes to increase knowledge and cardiac awareness. Provide exercise programming to assist with improving exercise tolerance and activity levels   Expected Outcomes Pt will have increased cardiac awareness and improved exercise tolerance to be able to live a healthier lifestyle and  play with grandchildren.      Core Components/Risk Factors/Patient Goals Review:    Core Components/Risk Factors/Patient Goals at Discharge (Final Review):    ITP Comments:     ITP Comments    Row Name 11/01/16 0840           ITP Comments Dr. Armanda Magicraci Turner, Medical Director          Comments:  Pt in today for cardiac rehab orientation from [phone] [id number].  As a part of the orientation appt, pt completed warm up stretches and 6 minute walk test.  Pt tolerated walk test with no complaints of cp or sob.  Monitor showed sr with no noted ectopy.  Brief psychosocial assessment reveals no immediate needs or concerns.  Continue to monitor with periodic check in for overall well being.  Pt is looking forward to full exercise on next week. Alanson Alyarlette Carlton RN, BSN

## 2016-11-01 NOTE — Progress Notes (Signed)
Cardiac Rehab Medication Review by a Pharmacist  Does the patient  feel that his/her medications are working for him/her?  yes  Has the patient been experiencing any side effects to the medications prescribed?  no  Does the patient measure his/her own blood pressure at home?  yes   Does the patient have any problems obtaining medications due to transportation or finances?   no  Understanding of regimen: excellent Understanding of indications: excellent Potential of compliance: excellent    Pharmacist comments: Mr. Sean Schultz is a 65 yo male who presents to cardiac rehab today in good spirits. He is a very good historian who seems to understand his medications and their importance very well. He stated that he is not having any issues with his current medication.   [name], PharmD Acute Care Pharmacy Resident  Pager: [phone] 11/01/2016

## 2016-11-07 ENCOUNTER — Encounter (HOSPITAL_COMMUNITY)
Admission: RE | Admit: 2016-11-07 | Discharge: 2016-11-07 | Disposition: A | Discharge status: Home / Self Care | Payer: No Typology Code available for payment source | Source: Ambulatory Visit | Attending: Cardiovascular Disease | Admitting: Cardiovascular Disease

## 2016-11-07 DIAGNOSIS — Z955 Presence of coronary angioplasty implant and graft: Secondary | ICD-10-CM

## 2016-11-07 NOTE — Progress Notes (Signed)
Daily Session Note  Patient Details  Name: Sean Schultz MRN: [id number] Date of Birth: 1951/02/05 Referring Provider:   Flowsheet Row CARDIAC REHAB PHASE II ORIENTATION from 11/01/2016 in [place]  Referring Provider  Darlina Guys MD      Encounter Date: 11/07/2016  Check In:     Session Check In - 11/07/16 0707      Check-In   Location [facility]   Staff Present Cleda Mccreedy, MS, Exercise Physiologist;Hridaan Bouse Wilber Oliphant, RN, BSN;Joann Rion, RN, BSN   Supervising physician immediately available to respond to emergencies [facility] immediately available   Physician(s) Dr. Wynetta Emery    Medication changes reported     No   Fall or balance concerns reported    No   Warm-up and Cool-down Performed as group-led instruction   Resistance Training Performed No   VAD Patient? No     Pain Assessment   Currently in Pain? No/denies      Capillary Blood Glucose: No results found for this or any previous visit (from the past 24 hour(s)).   Goals Met:  Exercise tolerated well Personal goals reviewed  Goals Unmet:  Not Applicable  Comments:  omments:  Pt started full exercise at Phase II cardiac rehab today.  Pt tolerated light exercise without difficulty. VSS, telemetry- SR with no noted ectopy, asymptomatic.  Medication list reconciled. Pt denies barriers to medication compliance.  PSYCHOSOCIAL ASSESSMENT:  PHQ-0. Pt exhibits positive coping skills, hopeful outlook with supportive family. Pt completed QOL survey. Pt scored the following      Quality of Life - 11/01/16 1036      Quality of Life Scores   Health/Function Pre 26.89 %   Socioeconomic Pre 27.43 %   Psych/Spiritual Pre 28.29 %   Family Pre 28.8 %   GLOBAL Pre 27.59 %    No psychosocial needs identified at this time, no psychosocial interventions necessary. Pt chuckled when asked about depression.    Pt enjoys going to the beach, playing with  grandchildren.  Pt had 7 grandchildren over the holidays and enjoyed every minute.   Pt oriented to exercise equipment and routine.    Pt desires to prevent the progression of heart disease. Longer term is to live longer and play with grandchildren.  Will track pt progress toward CAD risk factor modification and interactions with grand children. Understanding verbalized. Maurice Small RN, BSN     Dr. Fransico Him is Medical Director for Cardiac Rehab at [facility].

## 2016-11-09 ENCOUNTER — Encounter (HOSPITAL_COMMUNITY): Payer: No Typology Code available for payment source

## 2016-11-14 ENCOUNTER — Encounter (HOSPITAL_COMMUNITY)
Admission: RE | Admit: 2016-11-14 | Discharge: 2016-11-14 | Disposition: A | Discharge status: Home / Self Care | Payer: No Typology Code available for payment source | Source: Ambulatory Visit | Attending: Cardiovascular Disease | Admitting: Cardiovascular Disease

## 2016-11-14 DIAGNOSIS — Z955 Presence of coronary angioplasty implant and graft: Secondary | ICD-10-CM | POA: Insufficient documentation

## 2016-11-15 NOTE — Progress Notes (Signed)
Cardiac Individual Treatment Plan  Patient Details  Name: Sean Schultz MRN: [id number] Date of Birth: 10/06/1951 Referring Provider:   Flowsheet Row CARDIAC REHAB PHASE II ORIENTATION from 11/01/2016 in [place]  Referring Provider  Darlina Guys MD      Initial Encounter Date:  [facility] PHASE II ORIENTATION from 11/01/2016 in [place]  Date  11/01/16  Referring Provider  Darlina Guys MD      Visit Diagnosis: 09/24/16 Status post coronary artery stent placement  Patient's Home Medications on Admission:  Current Outpatient Prescriptions:  .  aspirin EC 81 MG tablet, Take 81 mg by mouth daily. , Disp: , Rfl:  .  azelastine (OPTIVAR) 0.05 % ophthalmic solution, Place 1 drop into both eyes daily. , Disp: , Rfl: 0 .  calcium carbonate (TUMS - DOSED IN MG ELEMENTAL CALCIUM) 500 MG chewable tablet, Chew 1 tablet by mouth 2 (two) times daily., Disp: , Rfl:  .  clopidogrel (PLAVIX) 75 MG tablet, Take 1 tablet (75 mg total) by mouth daily., Disp: 30 tablet, Rfl: 11 .  Coenzyme Q10 (CO Q 10 PO), Take 1 capsule by mouth 2 (two) times daily., Disp: , Rfl:  .  Cyanocobalamin (RA VITAMIN B-12 TR) 1000 MCG TBCR, Take 1,000 mcg by mouth daily. , Disp: , Rfl:  .  fenofibrate 160 MG tablet, Take 160 mg by mouth daily., Disp: , Rfl: 0 .  fluticasone (VERAMYST) 27.5 MCG/SPRAY nasal spray, Place 1 spray into the nose 2 (two) times daily., Disp: , Rfl:  .  folic acid (FOLVITE) 1 MG tablet, Take 1 mg by mouth 2 (two) times daily., Disp: , Rfl:  .  Melatonin 3 MG TABS, Take 3 mg by mouth at bedtime as needed (sleep)., Disp: , Rfl:  .  metoprolol tartrate (LOPRESSOR) 25 MG tablet, Take 1 tablet (25 mg total) by mouth 2 (two) times daily., Disp: 60 tablet, Rfl: 6 .  montelukast (SINGULAIR) 10 MG tablet, Take 10 mg by mouth daily. , Disp: , Rfl: 1 .  NIACIN PO, Take 1 tablet by mouth daily. , Disp: , Rfl:  .   nitroGLYCERIN (NITROSTAT) 0.4 MG SL tablet, Place 1 tablet (0.4 mg total) under the tongue every 5 (five) minutes as needed., Disp: 25 tablet, Rfl: 3 .  Omega-3 Fatty Acids (FISH OIL PO), Take 1 capsule by mouth daily., Disp: , Rfl:  .  Plant Sterol Stanol-Pantethine (CHOLESTOFF COMPLETE) 300-100 MG CAPS, Take 2 capsules by mouth every evening. , Disp: , Rfl:  .  valsartan (DIOVAN) 160 MG tablet, Take 160 mg by mouth daily. , Disp: , Rfl: 1  Past Medical History: Past Medical History:  Diagnosis Date  . BPH (benign prostatic hypertrophy)   . Coronary artery disease   . GERD (gastroesophageal reflux disease)   . H/O seasonal allergies   . Hyperlipemia   . Hypertension   . Prostatitis     Tobacco Use: History  Smoking Status  . Former Smoker  . Packs/day: 0.50  . Years: 12.00  . Types: Cigarettes  . Quit date: 11/17/1978  Smokeless Tobacco  . Never Used    Labs: Recent Review Flowsheet Data    There is no flowsheet data to display.      Capillary Blood Glucose: Lab Results  Component Value Date   GLUCAP 130 (H) 07/14/2016     Exercise Target Goals:    Exercise Program Goal: Individual exercise prescription set with THRR,  safety & activity barriers. Participant demonstrates ability to understand and report RPE using BORG scale, to self-measure pulse accurately, and to acknowledge the importance of the exercise prescription.  Exercise Prescription Goal: Starting with aerobic activity 30 plus minutes a day, 3 days per week for initial exercise prescription. Provide home exercise prescription and guidelines that participant acknowledges understanding prior to discharge.  Activity Barriers & Risk Stratification:     Activity Barriers & Cardiac Risk Stratification - 11/01/16 0850      Activity Barriers & Cardiac Risk Stratification   Activity Barriers Other (comment)   Comments R artificial hip   Cardiac Risk Stratification High      6 Minute Walk:     6 Minute  Walk    Row Name 11/01/16 0950 11/01/16 1004       6 Minute Walk   Phase Initial  -    Distance 1552 feet  -    Walk Time 6 minutes  -    # of Rest Breaks 0  -    MPH  - 2.9    METS  - 3.3    RPE 19  -    VO2 Peak  - 11.7    Symptoms No  -    Resting HR 63 bpm  -    Resting BP 122/76  -    Max Ex. HR 87 bpm  -    Max Ex. BP 132/76  -    2 Minute Post BP 124/82  -       Initial Exercise Prescription:     Initial Exercise Prescription - 11/01/16 1000      Date of Initial Exercise RX and Referring Provider   Date 11/01/16   Referring Provider Earney Hamburg MD     Bike   Level 0.8   Minutes 10   METs 2.89     NuStep   Level 2   Minutes 10   METs 2.3     Track   Laps 11   Minutes 10   METs 2.92     Prescription Details   Frequency (times per week) 3   Duration Progress to 45 minutes of aerobic exercise without signs/symptoms of physical distress     Intensity   THRR 40-80% of Max Heartrate 61-122   Ratings of Perceived Exertion 11-13   Perceived Dyspnea 0-4     Progression   Progression Continue to progress workloads to maintain intensity without signs/symptoms of physical distress.     Resistance Training   Training Prescription Yes   Weight 3   Reps 10-12      Perform Capillary Blood Glucose checks as needed.  Exercise Prescription Changes:   Exercise Comments:     Exercise Comments    Row Name 11/14/16 0818           Exercise Comments Pt is off to a great start with exercise          Discharge Exercise Prescription (Final Exercise Prescription Changes):   Nutrition:  Target Goals: Understanding of nutrition guidelines, daily intake of sodium 1500mg , cholesterol 200mg , calories 30% from fat and 7% or less from saturated fats, daily to have 5 or more servings of fruits and vegetables.  Biometrics:     Pre Biometrics - 11/01/16 1028      Pre Biometrics   % Body Fat 25.8 %       Nutrition Therapy Plan and Nutrition  Goals:   Nutrition Discharge: Nutrition Scores:  Nutrition Goals Re-Evaluation:   Psychosocial: Target Goals: Acknowledge presence or absence of depression, maximize coping skills, provide positive support system. Participant is able to verbalize types and ability to use techniques and skills needed for reducing stress and depression.  Initial Review & Psychosocial Screening:     Initial Psych Review & Screening - 11/15/16 1156      Barriers   Psychosocial barriers to participate in program There are no identifiable barriers or psychosocial needs.      Quality of Life Scores:     Quality of Life - 11/01/16 1036      Quality of Life Scores   Health/Function Pre 26.89 %   Socioeconomic Pre 27.43 %   Psych/Spiritual Pre 28.29 %   Family Pre 28.8 %   GLOBAL Pre 27.59 %      PHQ-9: Recent Review Flowsheet Data    Depression screen PHQ 2/9 11/07/2016   Decreased Interest 1   Down, Depressed, Hopeless 1   PHQ - 2 Score 2      Psychosocial Evaluation and Intervention:     Psychosocial Evaluation - 11/15/16 1157      Psychosocial Evaluation & Interventions   Interventions Encouraged to exercise with the program and follow exercise prescription   Comments Pt demonstrates positive and healthy coping skills.  Good support from family.   Continued Psychosocial Services Needed No      Psychosocial Re-Evaluation:     Psychosocial Re-Evaluation    Row Name 11/15/16 1158             Psychosocial Re-Evaluation   Interventions Encouraged to attend Cardiac Rehabilitation for the exercise       Comments Pt off to a good start.  Denies any needs at this time.           Vocational Rehabilitation: Provide vocational rehab assistance to qualifying candidates.   Vocational Rehab Evaluation & Intervention:     Vocational Rehab - 11/01/16 1453      Initial Vocational Rehab Evaluation & Intervention   Assessment shows need for Vocational Rehabilitation No  Pt  feels he will be able to reutrn to his jov and [employer] at [employer]      Education: Education Goals: Education classes will be provided on a weekly basis, covering required topics. Participant will state understanding/return demonstration of topics presented.  Learning Barriers/Preferences:   Education Topics: Count Your Pulse:  -Group instruction provided by verbal instruction, demonstration, patient participation and written materials to support subject.  Instructors address importance of being able to find your pulse and how to count your pulse when at home without a heart monitor.  Patients get hands on experience counting their pulse with staff help and individually.   Heart Attack, Angina, and Risk Factor Modification:  -Group instruction provided by verbal instruction, video, and written materials to support subject.  Instructors address signs and symptoms of angina and heart attacks.    Also discuss risk factors for heart disease and how to make changes to improve heart health risk factors.   Functional Fitness:  -Group instruction provided by verbal instruction, demonstration, patient participation, and written materials to support subject.  Instructors address safety measures for doing things around the house.  Discuss how to get up and down off the floor, how to pick things up properly, how to safely get out of a chair without assistance, and balance training.   Meditation and Mindfulness:  -Group instruction provided by verbal instruction, patient  participation, and written materials to support subject.  Instructor addresses importance of mindfulness and meditation practice to help reduce stress and improve awareness.  Instructor also leads participants through a meditation exercise.    Stretching for Flexibility and Mobility:  -Group instruction provided by verbal instruction, patient participation, and written materials to support subject.   Instructors lead participants through series of stretches that are designed to increase flexibility thus improving mobility.  These stretches are additional exercise for major muscle groups that are typically performed during regular warm up and cool down.   Hands Only CPR Anytime:  -Group instruction provided by verbal instruction, video, patient participation and written materials to support subject.  Instructors co-teach with AHA video for hands only CPR.  Participants get hands on experience with mannequins.   Nutrition I class: Heart Healthy Eating:  -Group instruction provided by PowerPoint slides, verbal discussion, and written materials to support subject matter. The instructor gives an explanation and review of the Therapeutic Lifestyle Changes diet recommendations, which includes a discussion on lipid goals, dietary fat, sodium, fiber, plant stanol/sterol esters, sugar, and the components of a well-balanced, healthy diet.   Nutrition II class: Lifestyle Skills:  -Group instruction provided by PowerPoint slides, verbal discussion, and written materials to support subject matter. The instructor gives an explanation and review of label reading, grocery shopping for heart health, heart healthy recipe modifications, and ways to make healthier choices when eating out.   Diabetes Question & Answer:  -Group instruction provided by PowerPoint slides, verbal discussion, and written materials to support subject matter. The instructor gives an explanation and review of diabetes co-morbidities, pre- and post-prandial blood glucose goals, pre-exercise blood glucose goals, signs, symptoms, and treatment of hypoglycemia and hyperglycemia, and foot care basics.   Diabetes Blitz:  -Group instruction provided by PowerPoint slides, verbal discussion, and written materials to support subject matter. The instructor gives an explanation and review of the physiology behind type 1 and type 2 diabetes, diabetes  medications and rational behind using different medications, pre- and post-prandial blood glucose recommendations and Hemoglobin A1c goals, diabetes diet, and exercise including blood glucose guidelines for exercising safely.    Portion Distortion:  -Group instruction provided by PowerPoint slides, verbal discussion, written materials, and food models to support subject matter. The instructor gives an explanation of serving size versus portion size, changes in portions sizes over the last 20 years, and what consists of a serving from each food group. Flowsheet Row CARDIAC REHAB [facility]  Date  11/14/16  Educator  RD  Instruction Review Code  2- meets goals/outcomes      Stress Management:  -Group instruction provided by verbal instruction, video, and written materials to support subject matter.  Instructors review role of stress in heart disease and how to cope with stress positively.     Exercising on Your Own:  -Group instruction provided by verbal instruction, power point, and written materials to support subject.  Instructors discuss benefits of exercise, components of exercise, frequency and intensity of exercise, and end points for exercise.  Also discuss use of nitroglycerin and activating EMS.  Review options of places to exercise outside of rehab.  Review guidelines for sex with heart disease.   Cardiac Drugs I:  -Group instruction provided by verbal instruction and written materials to support subject.  Instructor reviews cardiac drug classes: antiplatelets, anticoagulants, beta blockers, and statins.  Instructor discusses reasons, side effects, and lifestyle considerations for each  drug class.   Cardiac Drugs II:  -Group instruction provided by verbal instruction and written materials to support subject.  Instructor reviews cardiac drug classes: angiotensin converting enzyme inhibitors (ACE-I), angiotensin II  receptor blockers (ARBs), nitrates, and calcium channel blockers.  Instructor discusses reasons, side effects, and lifestyle considerations for each drug class.   Anatomy and Physiology of the Circulatory System:  -Group instruction provided by verbal instruction, video, and written materials to support subject.  Reviews functional anatomy of heart, how it relates to various diagnoses, and what role the heart plays in the overall system.   Knowledge Questionnaire Score:     Knowledge Questionnaire Score - 11/01/16 0947      Knowledge Questionnaire Score   Pre Score 18/24      Core Components/Risk Factors/Patient Goals at Admission:     Personal Goals and Risk Factors at Admission - 11/01/16 0850      Core Components/Risk Factors/Patient Goals on Admission    Weight Management Yes;Weight Loss   Intervention Weight Management: Develop a combined nutrition and exercise program designed to reach desired caloric intake, while maintaining appropriate intake of nutrient and fiber, sodium and fats, and appropriate energy expenditure required for the weight goal.;Weight Management: Provide education and appropriate resources to help participant work on and attain dietary goals.;Weight Management/Obesity: Establish reasonable short term and long term weight goals.;Obesity: Provide education and appropriate resources to help participant work on and attain dietary goals.   Expected Outcomes Short Term: Continue to assess and modify interventions until short term weight is achieved;Long Term: Adherence to nutrition and physical activity/exercise program aimed toward attainment of established weight goal;Weight Maintenance: Understanding of the daily nutrition guidelines, which includes 25-35% calories from fat, 7% or less cal from saturated fats, less than 200mg  cholesterol, less than 1.5gm of sodium, & 5 or more servings of fruits and vegetables daily;Weight Loss: Understanding of general  recommendations for a balanced deficit meal plan, which promotes 1-2 lb weight loss per week and includes a negative energy balance of [phone] kcal/d;Understanding recommendations for meals to include 15-35% energy as protein, 25-35% energy from fat, 35-60% energy from carbohydrates, less than 200mg  of dietary cholesterol, 20-35 gm of total fiber daily;Understanding of distribution of calorie intake throughout the day with the consumption of 4-5 meals/snacks   Sedentary Yes   Intervention Provide advice, education, support and counseling about physical activity/exercise needs.;Develop an individualized exercise prescription for aerobic and resistive training based on initial evaluation findings, risk stratification, comorbidities and participant's personal goals.   Expected Outcomes Achievement of increased cardiorespiratory fitness and enhanced flexibility, muscular endurance and strength shown through measurements of functional capacity and personal statement of participant.   Increase Strength and Stamina Yes   Intervention Develop an individualized exercise prescription for aerobic and resistive training based on initial evaluation findings, risk stratification, comorbidities and participant's personal goals.;Provide advice, education, support and counseling about physical activity/exercise needs.   Expected Outcomes Achievement of increased cardiorespiratory fitness and enhanced flexibility, muscular endurance and strength shown through measurements of functional capacity and personal statement of participant.   Improve shortness of breath with ADL's Yes   Intervention Provide education, individualized exercise plan and daily activity instruction to help decrease symptoms of SOB with activities of daily living.   Expected Outcomes Short Term: Achieves a reduction of symptoms when performing activities of daily living.   Hypertension Yes   Intervention Provide education on lifestyle modifcations  including regular physical activity/exercise, weight management, moderate sodium restriction and increased consumption of  fresh fruit, vegetables, and low fat dairy, alcohol moderation, and smoking cessation.;Monitor prescription use compliance.   Expected Outcomes Short Term: Continued assessment and intervention until BP is < 140/32mm HG in hypertensive participants. < 130/70mm HG in hypertensive participants with diabetes, heart failure or chronic kidney disease.;Long Term: Maintenance of blood pressure at goal levels.   Lipids Yes   Expected Outcomes Short Term: Participant states understanding of desired cholesterol values and is compliant with medications prescribed. Participant is following exercise prescription and nutrition guidelines.;Long Term: Cholesterol controlled with medications as prescribed, with individualized exercise RX and with personalized nutrition plan. Value goals: LDL < 70mg , HDL > 40 mg.   Personal Goal Other Yes   Personal Goal Learn prevention or progression of CVD and be able to live longer and play with grandchildren   Intervention Provide cardiac education classes to increase knowledge and cardiac awareness. Provide exercise programming to assist with improving exercise tolerance and activity levels   Expected Outcomes Pt will have increased cardiac awareness and improved exercise tolerance to be able to live a healthier lifestyle and play with grandchildren.      Core Components/Risk Factors/Patient Goals Review:    Core Components/Risk Factors/Patient Goals at Discharge (Final Review):    ITP Comments:     ITP Comments    Row Name 11/01/16 0840           ITP Comments Dr. Armanda Magic, Medical Director          Comments:  Pt is making expected progress toward personal goals after completing 3 sessions. Pt is off to a great start.Repeat Psychosocial Assessment reveals no changes from previous assessment. Pt interacting positively with fellow  participants in a humorous way.  Pt feels he is making progress toward meeting his goals. pt would like to have the energy to play with his grandchildren who live in [place]. Recommend continued exercise and life style modification education including  stress management and relaxation techniques to decrease cardiac risk profile. Alanson Aly, BSN

## 2016-11-16 ENCOUNTER — Encounter (HOSPITAL_COMMUNITY)
Admission: RE | Admit: 2016-11-16 | Discharge: 2016-11-16 | Disposition: A | Discharge status: Home / Self Care | Payer: No Typology Code available for payment source | Source: Ambulatory Visit | Attending: Cardiovascular Disease | Admitting: Cardiovascular Disease

## 2016-11-16 DIAGNOSIS — Z955 Presence of coronary angioplasty implant and graft: Secondary | ICD-10-CM | POA: Diagnosis not present

## 2016-11-19 ENCOUNTER — Encounter (HOSPITAL_COMMUNITY)
Admission: RE | Admit: 2016-11-19 | Discharge: 2016-11-19 | Disposition: A | Discharge status: Home / Self Care | Payer: No Typology Code available for payment source | Source: Ambulatory Visit | Attending: Cardiovascular Disease | Admitting: Cardiovascular Disease

## 2016-11-19 DIAGNOSIS — Z955 Presence of coronary angioplasty implant and graft: Secondary | ICD-10-CM | POA: Diagnosis not present

## 2016-11-19 NOTE — Progress Notes (Signed)
Reviewed home exercise program with pt.  Discussed mode/frequency of exercise, THRR, RPE and weather conditions for exercising outdoors.  Also discussed sign and symptoms, NTG use and when to call Dr./911.  Pt verbalized understanding.  Rosine DoorAshley Jonavon Trieu, MS 11/19/16 09:01

## 2016-11-21 ENCOUNTER — Encounter (HOSPITAL_COMMUNITY)
Admission: RE | Admit: 2016-11-21 | Discharge: 2016-11-21 | Disposition: A | Discharge status: Home / Self Care | Payer: No Typology Code available for payment source | Source: Ambulatory Visit | Attending: Cardiovascular Disease | Admitting: Cardiovascular Disease

## 2016-11-21 DIAGNOSIS — Z955 Presence of coronary angioplasty implant and graft: Secondary | ICD-10-CM

## 2016-11-23 ENCOUNTER — Encounter (HOSPITAL_COMMUNITY)
Admission: RE | Admit: 2016-11-23 | Discharge: 2016-11-23 | Disposition: A | Discharge status: Home / Self Care | Payer: No Typology Code available for payment source | Source: Ambulatory Visit | Attending: Cardiovascular Disease | Admitting: Cardiovascular Disease

## 2016-11-23 DIAGNOSIS — Z955 Presence of coronary angioplasty implant and graft: Secondary | ICD-10-CM

## 2016-11-26 ENCOUNTER — Encounter (HOSPITAL_COMMUNITY)
Admission: RE | Admit: 2016-11-26 | Discharge: 2016-11-26 | Disposition: A | Discharge status: Home / Self Care | Payer: No Typology Code available for payment source | Source: Ambulatory Visit | Attending: Cardiovascular Disease | Admitting: Cardiovascular Disease

## 2016-11-26 DIAGNOSIS — Z955 Presence of coronary angioplasty implant and graft: Secondary | ICD-10-CM | POA: Diagnosis not present

## 2016-11-28 ENCOUNTER — Encounter (HOSPITAL_COMMUNITY): Payer: No Typology Code available for payment source

## 2016-11-30 ENCOUNTER — Encounter (HOSPITAL_COMMUNITY): Payer: No Typology Code available for payment source

## 2016-12-03 ENCOUNTER — Encounter (HOSPITAL_COMMUNITY): Payer: No Typology Code available for payment source

## 2016-12-05 ENCOUNTER — Encounter (HOSPITAL_COMMUNITY)
Admission: RE | Admit: 2016-12-05 | Discharge: 2016-12-05 | Disposition: A | Discharge status: Home / Self Care | Payer: No Typology Code available for payment source | Source: Ambulatory Visit | Attending: Cardiology | Admitting: Cardiology

## 2016-12-05 DIAGNOSIS — Z955 Presence of coronary angioplasty implant and graft: Secondary | ICD-10-CM | POA: Diagnosis not present

## 2016-12-07 ENCOUNTER — Encounter (HOSPITAL_COMMUNITY)
Admission: RE | Admit: 2016-12-07 | Discharge: 2016-12-07 | Disposition: A | Discharge status: Home / Self Care | Payer: No Typology Code available for payment source | Source: Ambulatory Visit | Attending: Cardiovascular Disease | Admitting: Cardiovascular Disease

## 2016-12-07 DIAGNOSIS — Z955 Presence of coronary angioplasty implant and graft: Secondary | ICD-10-CM

## 2016-12-10 ENCOUNTER — Encounter (HOSPITAL_COMMUNITY)
Admission: RE | Admit: 2016-12-10 | Discharge: 2016-12-10 | Disposition: A | Discharge status: Home / Self Care | Payer: No Typology Code available for payment source | Source: Ambulatory Visit | Attending: Cardiovascular Disease | Admitting: Cardiovascular Disease

## 2016-12-10 DIAGNOSIS — Z955 Presence of coronary angioplasty implant and graft: Secondary | ICD-10-CM

## 2016-12-12 ENCOUNTER — Encounter (HOSPITAL_COMMUNITY)
Admission: RE | Admit: 2016-12-12 | Discharge: 2016-12-12 | Disposition: A | Discharge status: Home / Self Care | Payer: No Typology Code available for payment source | Source: Ambulatory Visit | Attending: Cardiovascular Disease | Admitting: Cardiovascular Disease

## 2016-12-12 DIAGNOSIS — Z955 Presence of coronary angioplasty implant and graft: Secondary | ICD-10-CM

## 2016-12-13 NOTE — Progress Notes (Signed)
Cardiac Individual Treatment Plan  Patient Details  Name: Sean Schultz MRN: [id number] Date of Birth: 10/06/1951 Referring Provider:   Flowsheet Row CARDIAC REHAB PHASE II ORIENTATION from 11/01/2016 in [place]  Referring Provider  Darlina Guys MD      Initial Encounter Date:  [facility] PHASE II ORIENTATION from 11/01/2016 in [place]  Date  11/01/16  Referring Provider  Darlina Guys MD      Visit Diagnosis: 09/24/16 Status post coronary artery stent placement  Patient's Home Medications on Admission:  Current Outpatient Prescriptions:  .  aspirin EC 81 MG tablet, Take 81 mg by mouth daily. , Disp: , Rfl:  .  azelastine (OPTIVAR) 0.05 % ophthalmic solution, Place 1 drop into both eyes daily. , Disp: , Rfl: 0 .  calcium carbonate (TUMS - DOSED IN MG ELEMENTAL CALCIUM) 500 MG chewable tablet, Chew 1 tablet by mouth 2 (two) times daily., Disp: , Rfl:  .  clopidogrel (PLAVIX) 75 MG tablet, Take 1 tablet (75 mg total) by mouth daily., Disp: 30 tablet, Rfl: 11 .  Coenzyme Q10 (CO Q 10 PO), Take 1 capsule by mouth 2 (two) times daily., Disp: , Rfl:  .  Cyanocobalamin (RA VITAMIN B-12 TR) 1000 MCG TBCR, Take 1,000 mcg by mouth daily. , Disp: , Rfl:  .  fenofibrate 160 MG tablet, Take 160 mg by mouth daily., Disp: , Rfl: 0 .  fluticasone (VERAMYST) 27.5 MCG/SPRAY nasal spray, Place 1 spray into the nose 2 (two) times daily., Disp: , Rfl:  .  folic acid (FOLVITE) 1 MG tablet, Take 1 mg by mouth 2 (two) times daily., Disp: , Rfl:  .  Melatonin 3 MG TABS, Take 3 mg by mouth at bedtime as needed (sleep)., Disp: , Rfl:  .  metoprolol tartrate (LOPRESSOR) 25 MG tablet, Take 1 tablet (25 mg total) by mouth 2 (two) times daily., Disp: 60 tablet, Rfl: 6 .  montelukast (SINGULAIR) 10 MG tablet, Take 10 mg by mouth daily. , Disp: , Rfl: 1 .  NIACIN PO, Take 1 tablet by mouth daily. , Disp: , Rfl:  .   nitroGLYCERIN (NITROSTAT) 0.4 MG SL tablet, Place 1 tablet (0.4 mg total) under the tongue every 5 (five) minutes as needed., Disp: 25 tablet, Rfl: 3 .  Omega-3 Fatty Acids (FISH OIL PO), Take 1 capsule by mouth daily., Disp: , Rfl:  .  Plant Sterol Stanol-Pantethine (CHOLESTOFF COMPLETE) 300-100 MG CAPS, Take 2 capsules by mouth every evening. , Disp: , Rfl:  .  valsartan (DIOVAN) 160 MG tablet, Take 160 mg by mouth daily. , Disp: , Rfl: 1  Past Medical History: Past Medical History:  Diagnosis Date  . BPH (benign prostatic hypertrophy)   . Coronary artery disease   . GERD (gastroesophageal reflux disease)   . H/O seasonal allergies   . Hyperlipemia   . Hypertension   . Prostatitis     Tobacco Use: History  Smoking Status  . Former Smoker  . Packs/day: 0.50  . Years: 12.00  . Types: Cigarettes  . Quit date: 11/17/1978  Smokeless Tobacco  . Never Used    Labs: Recent Review Flowsheet Data    There is no flowsheet data to display.      Capillary Blood Glucose: Lab Results  Component Value Date   GLUCAP 130 (H) 07/14/2016     Exercise Target Goals:    Exercise Program Goal: Individual exercise prescription set with THRR,  safety & activity barriers. Participant demonstrates ability to understand and report RPE using BORG scale, to self-measure pulse accurately, and to acknowledge the importance of the exercise prescription.  Exercise Prescription Goal: Starting with aerobic activity 30 plus minutes a day, 3 days per week for initial exercise prescription. Provide home exercise prescription and guidelines that participant acknowledges understanding prior to discharge.  Activity Barriers & Risk Stratification:     Activity Barriers & Cardiac Risk Stratification - 11/01/16 0850      Activity Barriers & Cardiac Risk Stratification   Activity Barriers Other (comment)   Comments R artificial hip   Cardiac Risk Stratification High      6 Minute Walk:     6 Minute  Walk    Row Name 11/01/16 0950 11/01/16 1004       6 Minute Walk   Phase Initial  -    Distance 1552 feet  -    Walk Time 6 minutes  -    # of Rest Breaks 0  -    MPH  - 2.9    METS  - 3.3    RPE 19  -    VO2 Peak  - 11.7    Symptoms No  -    Resting HR 63 bpm  -    Resting BP 122/76  -    Max Ex. HR 87 bpm  -    Max Ex. BP 132/76  -    2 Minute Post BP 124/82  -       Initial Exercise Prescription:     Initial Exercise Prescription - 11/01/16 1000      Date of Initial Exercise RX and Referring Provider   Date 11/01/16   Referring Provider Earney HamburgMcAlhany, Chris MD     Bike   Level 0.8   Minutes 10   METs 2.89     NuStep   Level 2   Minutes 10   METs 2.3     Track   Laps 11   Minutes 10   METs 2.92     Prescription Details   Frequency (times per week) 3   Duration Progress to 45 minutes of aerobic exercise without signs/symptoms of physical distress     Intensity   THRR 40-80% of Max Heartrate 61-122   Ratings of Perceived Exertion 11-13   Perceived Dyspnea 0-4     Progression   Progression Continue to progress workloads to maintain intensity without signs/symptoms of physical distress.     Resistance Training   Training Prescription Yes   Weight 3   Reps 10-12      Perform Capillary Blood Glucose checks as needed.  Exercise Prescription Changes:     Exercise Prescription Changes    Row Name 12/03/16 1400             Response to Exercise   Blood Pressure (Admit) 133/84       Blood Pressure (Exercise) 142/80       Blood Pressure (Exit) 120/82       Heart Rate (Admit) 72 bpm       Heart Rate (Exercise) 99 bpm       Heart Rate (Exit) 67 bpm       Rating of Perceived Exertion (Exercise) 12       Duration Progress to 45 minutes of aerobic exercise without signs/symptoms of physical distress       Intensity THRR unchanged         Progression  Progression Continue to progress workloads to maintain intensity without signs/symptoms of physical  distress.       Average METs 3.8         Resistance Training   Training Prescription Yes       Weight 5lb       Reps 10-12         Bike   Level 1.5       Minutes 10       METs 4.52         NuStep   Level 4       Minutes 10       METs 2.8         Track   Laps 16       Minutes 10       METs 3.79         Home Exercise Plan   Plans to continue exercise at Home       Frequency Add 4 additional days to program exercise sessions.          Exercise Comments:     Exercise Comments    Row Name 11/14/16 0818 12/10/16 1100         Exercise Comments Pt is off to a great start with exercise Reviewed METs and goals with pt.  Pt is doing well with exercise          Discharge Exercise Prescription (Final Exercise Prescription Changes):     Exercise Prescription Changes - 12/03/16 1400      Response to Exercise   Blood Pressure (Admit) 133/84   Blood Pressure (Exercise) 142/80   Blood Pressure (Exit) 120/82   Heart Rate (Admit) 72 bpm   Heart Rate (Exercise) 99 bpm   Heart Rate (Exit) 67 bpm   Rating of Perceived Exertion (Exercise) 12   Duration Progress to 45 minutes of aerobic exercise without signs/symptoms of physical distress   Intensity THRR unchanged     Progression   Progression Continue to progress workloads to maintain intensity without signs/symptoms of physical distress.   Average METs 3.8     Resistance Training   Training Prescription Yes   Weight 5lb   Reps 10-12     Bike   Level 1.5   Minutes 10   METs 4.52     NuStep   Level 4   Minutes 10   METs 2.8     Track   Laps 16   Minutes 10   METs 3.79     Home Exercise Plan   Plans to continue exercise at Home   Frequency Add 4 additional days to program exercise sessions.      Nutrition:  Target Goals: Understanding of nutrition guidelines, daily intake of sodium 1500mg , cholesterol 200mg , calories 30% from fat and 7% or less from saturated fats, daily to have 5 or more servings of  fruits and vegetables.  Biometrics:     Pre Biometrics - 11/01/16 1028      Pre Biometrics   % Body Fat 25.8 %       Nutrition Therapy Plan and Nutrition Goals:   Nutrition Discharge: Nutrition Scores:   Nutrition Goals Re-Evaluation:   Psychosocial: Target Goals: Acknowledge presence or absence of depression, maximize coping skills, provide positive support system. Participant is able to verbalize types and ability to use techniques and skills needed for reducing stress and depression.  Initial Review & Psychosocial Screening:     Initial Psych Review & Screening - 11/15/16  1156      Barriers   Psychosocial barriers to participate in program There are no identifiable barriers or psychosocial needs.      Quality of Life Scores:     Quality of Life - 11/01/16 1036      Quality of Life Scores   Health/Function Pre 26.89 %   Socioeconomic Pre 27.43 %   Psych/Spiritual Pre 28.29 %   Family Pre 28.8 %   GLOBAL Pre 27.59 %      PHQ-9: Recent Review Flowsheet Data    Depression screen PHQ 2/9 11/07/2016   Decreased Interest 1   Down, Depressed, Hopeless 1   PHQ - 2 Score 2      Psychosocial Evaluation and Intervention:     Psychosocial Evaluation - 12/13/16 1421      Psychosocial Evaluation & Interventions   Interventions Encouraged to exercise with the program and follow exercise prescription;Stress management education;Relaxation education   Comments Pt demonstrates positive and healthy coping skills.  Good support from family.   Continued Psychosocial Services Needed No      Psychosocial Re-Evaluation:     Psychosocial Re-Evaluation    Row Name 11/15/16 1158 12/13/16 1421           Psychosocial Re-Evaluation   Interventions Encouraged to attend Cardiac Rehabilitation for the exercise Encouraged to attend Cardiac Rehabilitation for the exercise;Stress management education;Relaxation education      Comments Pt off to a good start.  Denies any  needs at this time.  Pt off to a good start.  Denies any needs at this time.       Continued Psychosocial Services Needed  - No         Vocational Rehabilitation: Provide vocational rehab assistance to qualifying candidates.   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      Education: Education Goals: Education classes will be provided on a weekly basis, covering required topics. Participant will state understanding/return demonstration of topics presented.  Learning Barriers/Preferences:   Education Topics: Count Your Pulse:  -Group instruction provided by verbal instruction, demonstration, patient participation and written materials to support subject.  Instructors address importance of being able to find your pulse and how to count your pulse when at home without a heart monitor.  Patients get hands on experience counting their pulse with staff help and individually.   Heart Attack, Angina, and Risk Factor Modification:  -Group instruction provided by verbal instruction, video, and written materials to support subject.  Instructors address signs and symptoms of angina and heart attacks.    Also discuss risk factors for heart disease and how to make changes to improve heart health risk factors. Flowsheet Row CARDIAC REHAB PHASE II EXERCISE from 12/12/2016 in [facility] CARDIAC REHAB  Date  12/05/16  Instruction Review Code  2- meets goals/outcomes      Functional Fitness:  -Group instruction provided by verbal instruction, demonstration, patient participation, and written materials to support subject.  Instructors address safety measures for doing things around the house.  Discuss how to get up and down off  the floor, how to pick things up properly, how to safely get out of a chair without assistance, and balance training.  Meditation and Mindfulness:  -Group instruction provided by verbal instruction, patient participation, and written materials to support subject.  Instructor addresses importance of mindfulness and meditation practice to help reduce stress and improve awareness.  Instructor also leads participants through a meditation exercise.    Stretching for Flexibility and Mobility:  -Group instruction provided by verbal instruction, patient participation, and written materials to support subject.  Instructors lead participants through series of stretches that are designed to increase flexibility thus improving mobility.  These stretches are additional exercise for major muscle groups that are typically performed during regular warm up and cool down. Flowsheet Row CARDIAC REHAB PHASE II EXERCISE from 12/12/2016 in [facility] CARDIAC REHAB  Date  12/07/16  Instruction Review Code  2- meets goals/outcomes      Hands Only CPR Anytime:  -Group instruction provided by verbal instruction, video, patient participation and written materials to support subject.  Instructors co-teach with [facility] video for hands only CPR.  Participants get hands on experience with mannequins.   Nutrition I class: Heart Healthy Eating:  -Group instruction provided by PowerPoint slides, verbal discussion, and written materials to support subject matter. The instructor gives an explanation and review of the Therapeutic Lifestyle Changes diet recommendations, which includes a discussion on lipid goals, dietary fat, sodium, fiber, plant stanol/sterol esters, sugar, and the components of a well-balanced, healthy diet.   Nutrition II class: Lifestyle Skills:  -Group instruction provided by PowerPoint slides, verbal discussion, and written materials to support subject matter. The instructor gives an  explanation and review of label reading, grocery shopping for heart health, heart healthy recipe modifications, and ways to make healthier choices when eating out.   Diabetes Question & Answer:  -Group instruction provided by PowerPoint slides, verbal discussion, and written materials to support subject matter. The instructor gives an explanation and review of diabetes co-morbidities, pre- and post-prandial blood glucose goals, pre-exercise blood glucose goals, signs, symptoms, and treatment of hypoglycemia and hyperglycemia, and foot care basics.   Diabetes Blitz:  -Group instruction provided by PowerPoint slides, verbal discussion, and written materials to support subject matter. The instructor gives an explanation and review of the physiology behind type 1 and type 2 diabetes, diabetes medications and rational behind using different medications, pre- and post-prandial blood glucose recommendations and Hemoglobin A1c goals, diabetes diet, and exercise including blood glucose guidelines for exercising safely.    Portion Distortion:  -Group instruction provided by PowerPoint slides, verbal discussion, written materials, and food models to support subject matter. The instructor gives an explanation of serving size versus portion size, changes in portions sizes over the last 20 years, and what consists of a serving from each food group. Flowsheet Row CARDIAC REHAB PHASE II EXERCISE from 12/12/2016 in [facility] CARDIAC REHAB  Date  11/14/16  Educator  RD  Instruction Review Code  2- meets goals/outcomes      Stress Management:  -Group instruction provided by verbal instruction, video, and written materials to support subject matter.  Instructors review role of stress in heart disease and how to cope with stress positively.     Exercising on Your Own:  -Group instruction provided by verbal instruction, power point, and written materials to support subject.  Instructors discuss  benefits of exercise, components of exercise, frequency and intensity of exercise, and end points for exercise.  Also discuss use of nitroglycerin and activating EMS.  Review options of places to exercise outside of rehab.  Review guidelines for sex with heart  disease. Flowsheet Row CARDIAC REHAB PHASE II EXERCISE from 12/12/2016 in [facility] CARDIAC REHAB  Date  12/12/16  Instruction Review Code  2- meets goals/outcomes      Cardiac Drugs I:  -Group instruction provided by verbal instruction and written materials to support subject.  Instructor reviews cardiac drug classes: antiplatelets, anticoagulants, beta blockers, and statins.  Instructor discusses reasons, side effects, and lifestyle considerations for each drug class.   Cardiac Drugs II:  -Group instruction provided by verbal instruction and written materials to support subject.  Instructor reviews cardiac drug classes: angiotensin converting enzyme inhibitors (ACE-I), angiotensin II receptor blockers (ARBs), nitrates, and calcium channel blockers.  Instructor discusses reasons, side effects, and lifestyle considerations for each drug class. Flowsheet Row CARDIAC REHAB PHASE II EXERCISE from 12/12/2016 in [facility] CARDIAC REHAB  Date  11/21/16  Instruction Review Code  2- meets goals/outcomes      Anatomy and Physiology of the Circulatory System:  -Group instruction provided by verbal instruction, video, and written materials to support subject.  Reviews functional anatomy of heart, how it relates to various diagnoses, and what role the heart plays in the overall system.   Knowledge Questionnaire Score:     Knowledge Questionnaire Score - 11/01/16 0947      Knowledge Questionnaire Score   Pre Score 18/24      Core Components/Risk Factors/Patient Goals at Admission:     Personal Goals and Risk Factors at Admission - 11/01/16 0850      Core Components/Risk Factors/Patient Goals on  Admission    Weight Management Yes;Weight Loss   Intervention Weight Management: Develop a combined nutrition and exercise program designed to reach desired caloric intake, while maintaining appropriate intake of nutrient and fiber, sodium and fats, and appropriate energy expenditure required for the weight goal.;Weight Management: Provide education and appropriate resources to help participant work on and attain dietary goals.;Weight Management/Obesity: Establish reasonable short term and long term weight goals.;Obesity: Provide education and appropriate resources to help participant work on and attain dietary goals.   Expected Outcomes Short Term: Continue to assess and modify interventions until short term weight is achieved;Long Term: Adherence to nutrition and physical activity/exercise program aimed toward attainment of established weight goal;Weight Maintenance: Understanding of the daily nutrition guidelines, which includes 25-35% calories from fat, 7% or less cal from saturated fats, less than 200mg  cholesterol, less than 1.5gm of sodium, & 5 or more servings of fruits and vegetables daily;Weight Loss: Understanding of general recommendations for a balanced deficit meal plan, which promotes 1-2 lb weight loss per week and includes a negative energy balance of [id number] kcal/d;Understanding recommendations for meals to include 15-35% energy as protein, 25-35% energy from fat, 35-60% energy from carbohydrates, less than 200mg  of dietary cholesterol, 20-35 gm of total fiber daily;Understanding of distribution of calorie intake throughout the day with the consumption of 4-5 meals/snacks   Sedentary Yes   Intervention Provide advice, education, support and counseling about physical activity/exercise needs.;Develop an individualized exercise prescription for aerobic and resistive training based on initial evaluation findings, risk stratification, comorbidities and participant's personal goals.   Expected  Outcomes Achievement of increased cardiorespiratory fitness and enhanced flexibility, muscular endurance and strength shown through measurements of functional capacity and personal statement of participant.   Increase Strength and Stamina Yes   Intervention Develop an individualized exercise prescription for aerobic and resistive training based on initial evaluation findings, risk stratification, comorbidities and participant's personal goals.;Provide advice, education, support and counseling about physical  activity/exercise needs.   Expected Outcomes Achievement of increased cardiorespiratory fitness and enhanced flexibility, muscular endurance and strength shown through measurements of functional capacity and personal statement of participant.   Improve shortness of breath with ADL's Yes   Intervention Provide education, individualized exercise plan and daily activity instruction to help decrease symptoms of SOB with activities of daily living.   Expected Outcomes Short Term: Achieves a reduction of symptoms when performing activities of daily living.   Hypertension Yes   Intervention Provide education on lifestyle modifcations including regular physical activity/exercise, weight management, moderate sodium restriction and increased consumption of fresh fruit, vegetables, and low fat dairy, alcohol moderation, and smoking cessation.;Monitor prescription use compliance.   Expected Outcomes Short Term: Continued assessment and intervention until BP is < 140/44mm HG in hypertensive participants. < 130/80mm HG in hypertensive participants with diabetes, heart failure or chronic kidney disease.;Long Term: Maintenance of blood pressure at goal levels.   Lipids Yes   Expected Outcomes Short Term: Participant states understanding of desired cholesterol values and is compliant with medications prescribed. Participant is following exercise prescription and nutrition guidelines.;Long Term: Cholesterol controlled  with medications as prescribed, with individualized exercise RX and with personalized nutrition plan. Value goals: LDL < 70mg , HDL > 40 mg.   Personal Goal Other Yes   Personal Goal Learn prevention or progression of CVD and be able to live longer and play with grandchildren   Intervention Provide cardiac education classes to increase knowledge and cardiac awareness. Provide exercise programming to assist with improving exercise tolerance and activity levels   Expected Outcomes Pt will have increased cardiac awareness and improved exercise tolerance to be able to live a healthier lifestyle and play with grandchildren.      Core Components/Risk Factors/Patient Goals Review:      Goals and Risk Factor Review    Row Name 12/10/16 1100             Core Components/Risk Factors/Patient Goals Review   Personal Goals Review Increase Strength and Stamina;Other       Review Pt states he feels like his "old self" prior to his event.  He also states he feels like he has more energy and less fatigue with exertion.        Expected Outcomes Increase cardiovascular fitness and nutrition with CRPII, HEP and education classes           Core Components/Risk Factors/Patient Goals at Discharge (Final Review):      Goals and Risk Factor Review - 12/10/16 1100      Core Components/Risk Factors/Patient Goals Review   Personal Goals Review Increase Strength and Stamina;Other   Review Pt states he feels like his "old self" prior to his event.  He also states he feels like he has more energy and less fatigue with exertion.    Expected Outcomes Increase cardiovascular fitness and nutrition with CRPII, HEP and education classes       ITP Comments:     ITP Comments    Row Name 11/01/16 0840           ITP Comments Dr. Armanda Magic, Medical Director          Comments:  Pt is making expected progress toward personal goals after completing  12 sessions.  Psychosocial Assessment Pt has humorous  personality and is seen interacting with fellow participants.  Pt has supportive wife and he enjoys working. Recommend continued exercise and life style modification education including  stress management and relaxation techniques to  decrease cardiac risk profile. Cherre Huger, BSN

## 2016-12-14 ENCOUNTER — Encounter (HOSPITAL_COMMUNITY)
Admission: RE | Admit: 2016-12-14 | Discharge: 2016-12-14 | Disposition: A | Discharge status: Home / Self Care | Payer: No Typology Code available for payment source | Source: Ambulatory Visit | Attending: Cardiovascular Disease | Admitting: Cardiovascular Disease

## 2016-12-14 DIAGNOSIS — Z955 Presence of coronary angioplasty implant and graft: Secondary | ICD-10-CM | POA: Diagnosis present

## 2016-12-14 NOTE — Progress Notes (Addendum)
Sean Schultz 66 y.o. male Nutrition Note Spoke with pt. Nutrition Survey reviewed with pt. Pt is following Step 2 of the Therapeutic Lifestyle Changes diet. Pt expressed understanding of the information reviewed. Pt aware of nutrition education classes offered and plans on attending nutrition classes. No results found for: HGBA1C Wt Readings from Last 3 Encounters:  11/01/16 177 lb 0.5 oz (80.3 kg)  10/19/16 175 lb 12.8 oz (79.7 kg)  09/25/16 165 lb 5.5 oz (75 kg)   Nutrition Diagnosis ? Food-and nutrition-related knowledge deficit related to lack of exposure to information as related to diagnosis of: ? CVD  ? Overweight related to excessive energy intake as evidenced by a BMI of 28.2 Nutrition Intervention ? Benefits of adopting Therapeutic Lifestyle Changes discussed when [employer] reviewed. ? Pt to attend the Portion Distortion class ? Pt to attend the  ? Nutrition I class                      ? Nutrition II class ? Pt given handouts for: ? Nutrition I class ? Nutrition II class ? Continue client-centered nutrition education by RD, as part of interdisciplinary care.  Goal(s) Pt wants to lose 6-15 lb while in Cardiac Rehab Monitor and Evaluate progress toward nutrition goal with team.  Mickle PlumbEdna Promise Weldin, M.Ed, RD, LDN, CDE 12/14/2016 8:06 AM

## 2016-12-17 ENCOUNTER — Encounter (HOSPITAL_COMMUNITY)
Admission: RE | Admit: 2016-12-17 | Discharge: 2016-12-17 | Disposition: A | Discharge status: Home / Self Care | Payer: No Typology Code available for payment source | Source: Ambulatory Visit | Attending: Cardiovascular Disease | Admitting: Cardiovascular Disease

## 2016-12-17 DIAGNOSIS — Z955 Presence of coronary angioplasty implant and graft: Secondary | ICD-10-CM

## 2016-12-19 ENCOUNTER — Encounter (HOSPITAL_COMMUNITY): Payer: No Typology Code available for payment source

## 2016-12-21 ENCOUNTER — Encounter (HOSPITAL_COMMUNITY)
Admission: RE | Admit: 2016-12-21 | Discharge: 2016-12-21 | Disposition: A | Discharge status: Home / Self Care | Payer: No Typology Code available for payment source | Source: Ambulatory Visit | Attending: Cardiovascular Disease | Admitting: Cardiovascular Disease

## 2016-12-21 DIAGNOSIS — Z955 Presence of coronary angioplasty implant and graft: Secondary | ICD-10-CM

## 2016-12-24 ENCOUNTER — Encounter (HOSPITAL_COMMUNITY)
Admission: RE | Admit: 2016-12-24 | Discharge: 2016-12-24 | Disposition: A | Discharge status: Home / Self Care | Payer: No Typology Code available for payment source | Source: Ambulatory Visit | Attending: Cardiovascular Disease | Admitting: Cardiovascular Disease

## 2016-12-24 DIAGNOSIS — Z955 Presence of coronary angioplasty implant and graft: Secondary | ICD-10-CM | POA: Diagnosis not present

## 2016-12-24 NOTE — Progress Notes (Signed)
Pt with elevated bp on this past Friday at rest 160/80.  Bp decreased during exercise with normal bp readings post exercise 128/70.  Asked pt to track his bp readings throughout the weekend at various times of the day and record his readings.  On Monday, pt brought in his bp readings for review.  Pt readings trended rare 140 -118/ 58- 70.  Pt also took his diovan in the evening and noticed much improved readings throughout the day.  Pt for exercise today had wnl readings for both rest and on exertion.  Pt reports no dizziness upon arousing for the day nor during the night when he needs to go to the bathroom.  Will keep this same regimen and continue to monitor. Alanson Alyarlette Carlton RN, BSN

## 2016-12-26 ENCOUNTER — Encounter (HOSPITAL_COMMUNITY)
Admission: RE | Admit: 2016-12-26 | Discharge: 2016-12-26 | Disposition: A | Discharge status: Home / Self Care | Payer: No Typology Code available for payment source | Source: Ambulatory Visit | Attending: Cardiovascular Disease | Admitting: Cardiovascular Disease

## 2016-12-26 DIAGNOSIS — Z955 Presence of coronary angioplasty implant and graft: Secondary | ICD-10-CM

## 2016-12-28 ENCOUNTER — Encounter (HOSPITAL_COMMUNITY): Payer: No Typology Code available for payment source

## 2016-12-31 ENCOUNTER — Encounter (HOSPITAL_COMMUNITY)
Admission: RE | Admit: 2016-12-31 | Discharge: 2016-12-31 | Disposition: A | Discharge status: Home / Self Care | Payer: No Typology Code available for payment source | Source: Ambulatory Visit | Attending: Cardiovascular Disease | Admitting: Cardiovascular Disease

## 2016-12-31 DIAGNOSIS — Z955 Presence of coronary angioplasty implant and graft: Secondary | ICD-10-CM | POA: Diagnosis not present

## 2017-01-02 ENCOUNTER — Encounter (HOSPITAL_COMMUNITY): Payer: No Typology Code available for payment source

## 2017-01-04 ENCOUNTER — Encounter (HOSPITAL_COMMUNITY): Payer: No Typology Code available for payment source

## 2017-01-07 ENCOUNTER — Encounter (HOSPITAL_COMMUNITY)
Admission: RE | Admit: 2017-01-07 | Discharge: 2017-01-07 | Disposition: A | Discharge status: Home / Self Care | Payer: No Typology Code available for payment source | Source: Ambulatory Visit | Attending: Cardiovascular Disease | Admitting: Cardiovascular Disease

## 2017-01-07 DIAGNOSIS — Z955 Presence of coronary angioplasty implant and graft: Secondary | ICD-10-CM

## 2017-01-09 ENCOUNTER — Encounter (HOSPITAL_COMMUNITY): Payer: Self-pay

## 2017-01-09 ENCOUNTER — Encounter (HOSPITAL_COMMUNITY)
Admission: RE | Admit: 2017-01-09 | Discharge: 2017-01-09 | Disposition: A | Discharge status: Home / Self Care | Payer: No Typology Code available for payment source | Source: Ambulatory Visit | Attending: Cardiovascular Disease | Admitting: Cardiovascular Disease

## 2017-01-09 DIAGNOSIS — Z955 Presence of coronary angioplasty implant and graft: Secondary | ICD-10-CM

## 2017-01-09 NOTE — Progress Notes (Signed)
Cardiac Individual Treatment Plan  Patient Details  Name: Sean Schultz MRN: [id number] Date of Birth: 10/06/1951 Referring Provider:   Flowsheet Row CARDIAC REHAB PHASE II ORIENTATION from 11/01/2016 in [place]  Referring Provider  Darlina Guys MD      Initial Encounter Date:  [facility] PHASE II ORIENTATION from 11/01/2016 in [place]  Date  11/01/16  Referring Provider  Darlina Guys MD      Visit Diagnosis: 09/24/16 Status post coronary artery stent placement  Patient's Home Medications on Admission:  Current Outpatient Prescriptions:  .  aspirin EC 81 MG tablet, Take 81 mg by mouth daily. , Disp: , Rfl:  .  azelastine (OPTIVAR) 0.05 % ophthalmic solution, Place 1 drop into both eyes daily. , Disp: , Rfl: 0 .  calcium carbonate (TUMS - DOSED IN MG ELEMENTAL CALCIUM) 500 MG chewable tablet, Chew 1 tablet by mouth 2 (two) times daily., Disp: , Rfl:  .  clopidogrel (PLAVIX) 75 MG tablet, Take 1 tablet (75 mg total) by mouth daily., Disp: 30 tablet, Rfl: 11 .  Coenzyme Q10 (CO Q 10 PO), Take 1 capsule by mouth 2 (two) times daily., Disp: , Rfl:  .  Cyanocobalamin (RA VITAMIN B-12 TR) 1000 MCG TBCR, Take 1,000 mcg by mouth daily. , Disp: , Rfl:  .  fenofibrate 160 MG tablet, Take 160 mg by mouth daily., Disp: , Rfl: 0 .  fluticasone (VERAMYST) 27.5 MCG/SPRAY nasal spray, Place 1 spray into the nose 2 (two) times daily., Disp: , Rfl:  .  folic acid (FOLVITE) 1 MG tablet, Take 1 mg by mouth 2 (two) times daily., Disp: , Rfl:  .  Melatonin 3 MG TABS, Take 3 mg by mouth at bedtime as needed (sleep)., Disp: , Rfl:  .  metoprolol tartrate (LOPRESSOR) 25 MG tablet, Take 1 tablet (25 mg total) by mouth 2 (two) times daily., Disp: 60 tablet, Rfl: 6 .  montelukast (SINGULAIR) 10 MG tablet, Take 10 mg by mouth daily. , Disp: , Rfl: 1 .  NIACIN PO, Take 1 tablet by mouth daily. , Disp: , Rfl:  .   nitroGLYCERIN (NITROSTAT) 0.4 MG SL tablet, Place 1 tablet (0.4 mg total) under the tongue every 5 (five) minutes as needed., Disp: 25 tablet, Rfl: 3 .  Omega-3 Fatty Acids (FISH OIL PO), Take 1 capsule by mouth daily., Disp: , Rfl:  .  Plant Sterol Stanol-Pantethine (CHOLESTOFF COMPLETE) 300-100 MG CAPS, Take 2 capsules by mouth every evening. , Disp: , Rfl:  .  valsartan (DIOVAN) 160 MG tablet, Take 160 mg by mouth daily. , Disp: , Rfl: 1  Past Medical History: Past Medical History:  Diagnosis Date  . BPH (benign prostatic hypertrophy)   . Coronary artery disease   . GERD (gastroesophageal reflux disease)   . H/O seasonal allergies   . Hyperlipemia   . Hypertension   . Prostatitis     Tobacco Use: History  Smoking Status  . Former Smoker  . Packs/day: 0.50  . Years: 12.00  . Types: Cigarettes  . Quit date: 11/17/1978  Smokeless Tobacco  . Never Used    Labs: Recent Review Flowsheet Data    There is no flowsheet data to display.      Capillary Blood Glucose: Lab Results  Component Value Date   GLUCAP 130 (H) 07/14/2016     Exercise Target Goals:    Exercise Program Goal: Individual exercise prescription set with THRR,  safety & activity barriers. Participant demonstrates ability to understand and report RPE using BORG scale, to self-measure pulse accurately, and to acknowledge the importance of the exercise prescription.  Exercise Prescription Goal: Starting with aerobic activity 30 plus minutes a day, 3 days per week for initial exercise prescription. Provide home exercise prescription and guidelines that participant acknowledges understanding prior to discharge.  Activity Barriers & Risk Stratification:     Activity Barriers & Cardiac Risk Stratification - 11/01/16 0850      Activity Barriers & Cardiac Risk Stratification   Activity Barriers Other (comment)   Comments R artificial hip   Cardiac Risk Stratification High      6 Minute Walk:     6 Minute  Walk    Row Name 11/01/16 0950 11/01/16 1004       6 Minute Walk   Phase Initial  -    Distance 1552 feet  -    Walk Time 6 minutes  -    # of Rest Breaks 0  -    MPH  - 2.9    METS  - 3.3    RPE 19  -    VO2 Peak  - 11.7    Symptoms No  -    Resting HR 63 bpm  -    Resting BP 122/76  -    Max Ex. HR 87 bpm  -    Max Ex. BP 132/76  -    2 Minute Post BP 124/82  -       Oxygen Initial Assessment:   Oxygen Re-Evaluation:   Oxygen Discharge (Final Oxygen Re-Evaluation):   Initial Exercise Prescription:     Initial Exercise Prescription - 11/01/16 1000      Date of Initial Exercise RX and Referring Provider   Date 11/01/16   Referring Provider Darlina Guys MD     Bike   Level 0.8   Minutes 10   METs 2.89     NuStep   Level 2   Minutes 10   METs 2.3     Track   Laps 11   Minutes 10   METs 2.92     Prescription Details   Frequency (times per week) 3   Duration Progress to 45 minutes of aerobic exercise without signs/symptoms of physical distress     Intensity   THRR 40-80% of Max Heartrate 61-122   Ratings of Perceived Exertion 11-13   Perceived Dyspnea 0-4     Progression   Progression Continue to progress workloads to maintain intensity without signs/symptoms of physical distress.     Resistance Training   Training Prescription Yes   Weight 3   Reps 10-12      Perform Capillary Blood Glucose checks as needed.  Exercise Prescription Changes:     Exercise Prescription Changes    Row Name 12/03/16 1400 01/07/17 0800           Response to Exercise   Blood Pressure (Admit) 133/84 130/80      Blood Pressure (Exercise) 142/80 138/80      Blood Pressure (Exit) 120/82 122/80      Heart Rate (Admit) 72 bpm 68 bpm      Heart Rate (Exercise) 99 bpm 100 bpm      Heart Rate (Exit) 67 bpm 68 bpm      Rating of Perceived Exertion (Exercise) 12 11      Duration Progress to 45 minutes of aerobic exercise without signs/symptoms of physical  distress  Progress to 45 minutes of aerobic exercise without signs/symptoms of physical distress      Intensity THRR unchanged THRR unchanged        Progression   Progression Continue to progress workloads to maintain intensity without signs/symptoms of physical distress. Continue to progress workloads to maintain intensity without signs/symptoms of physical distress.      Average METs 3.8 3.9        Resistance Training   Training Prescription Yes Yes      Weight 5lb 5lb      Reps 10-12 10-15        Bike   Level 1.5 1.5      Minutes 10 10      METs 4.52 4.52        NuStep   Level 4 4      Minutes 10 10      METs 2.8 3.1        Track   Laps 16 18      Minutes 10 10      METs 3.79 4.14        Home Exercise Plan   Plans to continue exercise at [place] (comment)      Frequency Add 4 additional days to program exercise sessions. Add 4 additional days to program exercise sessions.         Exercise Comments:     Exercise Comments    Row Name 11/14/16 0818 12/10/16 1100 01/07/17 0843       Exercise Comments Pt is off to a great start with exercise Reviewed METs and goals with pt.  Pt is doing well with exercise  Reviewed METs and goals with pt.  Pt is doing well with exercise         Exercise Goals and Review:     Exercise Goals    Row Name 01/07/17 0843             Exercise Goals   Increase Physical Activity Yes       Intervention Provide advice, education, support and counseling about physical activity/exercise needs.;Develop an individualized exercise prescription for aerobic and resistive training based on initial evaluation findings, risk stratification, comorbidities and participant's personal goals.       Expected Outcomes Achievement of increased cardiorespiratory fitness and enhanced flexibility, muscular endurance and strength shown through measurements of functional capacity and personal statement of participant.       Increase Strength and Stamina Yes        Intervention Provide advice, education, support and counseling about physical activity/exercise needs.;Develop an individualized exercise prescription for aerobic and resistive training based on initial evaluation findings, risk stratification, comorbidities and participant's personal goals.       Expected Outcomes Achievement of increased cardiorespiratory fitness and enhanced flexibility, muscular endurance and strength shown through measurements of functional capacity and personal statement of participant.          Exercise Goals Re-Evaluation :     Exercise Goals Re-Evaluation    Row Name 01/09/17 1637 01/09/17 1638           Exercise Goal Re-Evaluation   Exercise Goals Review Increase Strenth and Stamina;Increase Physical Activity  -      Comments Pt is doing well with exercise and states he fells like he has more energy and strength.  He is able to do more without fatigue  -      Expected Outcomes  - Increase cardiovascular fitness and nutrition with CRPII, HEP and education  classes           Discharge Exercise Prescription (Final Exercise Prescription Changes):     Exercise Prescription Changes - 01/07/17 0800      Response to Exercise   Blood Pressure (Admit) 130/80   Blood Pressure (Exercise) 138/80   Blood Pressure (Exit) 122/80   Heart Rate (Admit) 68 bpm   Heart Rate (Exercise) 100 bpm   Heart Rate (Exit) 68 bpm   Rating of Perceived Exertion (Exercise) 11   Duration Progress to 45 minutes of aerobic exercise without signs/symptoms of physical distress   Intensity THRR unchanged     Progression   Progression Continue to progress workloads to maintain intensity without signs/symptoms of physical distress.   Average METs 3.9     Resistance Training   Training Prescription Yes   Weight 5lb   Reps 10-15     Bike   Level 1.5   Minutes 10   METs 4.52     NuStep   Level 4   Minutes 10   METs 3.1     Track   Laps 18   Minutes 10   METs 4.14      Home Exercise Plan   Plans to continue exercise at Home (comment)   Frequency Add 4 additional days to program exercise sessions.      Nutrition:  Target Goals: Understanding of nutrition guidelines, daily intake of sodium <1528m, cholesterol <2029m calories 30% from fat and 7% or less from saturated fats, daily to have 5 or more servings of fruits and vegetables.  Biometrics:     Pre Biometrics - 11/01/16 1028      Pre Biometrics   % Body Fat 25.8 %       Nutrition Therapy Plan and Nutrition Goals:     Nutrition Therapy & Goals - 12/14/16 0809      Nutrition Therapy   Diet Therapeutic Lifestyle Changes     Personal Nutrition Goals   Nutrition Goal Pt wants to lose 6-15 [place] regarding individualized specific dietary modifications aiming towards targeted core components such as weight, hypertension, lipid management, diabetes, heart failure and other comorbidities.   Expected Outcomes Short Term Goal: Understand basic principles of dietary content, such as calories, fat, sodium, cholesterol and nutrients.;Long Term Goal: Adherence to prescribed nutrition plan.      Nutrition Discharge: Nutrition Scores:   Nutrition Goals Re-Evaluation:   Nutrition Goals Re-Evaluation:   Nutrition Goals Discharge (Final Nutrition Goals Re-Evaluation):   Psychosocial: Target Goals: Acknowledge presence or absence of significant depression and/or stress, maximize coping skills, provide positive support system. Participant is able to verbalize types and ability to use techniques and skills needed for reducing stress and depression.  Initial Review & Psychosocial Screening:     Initial Psych Review & Screening - 11/15/16 1156      Barriers   Psychosocial barriers to participate in program There are no identifiable barriers or psychosocial needs.      Quality of Life Scores:     Quality  of Life - 11/01/16 1036      Quality of Life Scores   Health/Function Pre 26.89 %   Socioeconomic Pre 27.43 %   Psych/Spiritual Pre 28.29 %   Family Pre 28.8 %   GLOBAL Pre 27.59 %      PHQ-9: Recent Review Flowsheet Data    [facility]  Decreased Interest 1   Down, Depressed, Hopeless 1   PHQ - 2 Score 2     Interpretation of Total Score  Total Score Depression Severity:  1-4 = Minimal depression, 5-9 = Mild depression, 10-14 = Moderate depression, 15-19 = Moderately severe depression, 20-27 = Severe depression   Psychosocial Evaluation and Intervention:     Psychosocial Evaluation - 01/09/17 1703      Psychosocial Evaluation & Interventions   Interventions Encouraged to exercise with the program and follow exercise prescription;Stress management education;Relaxation education   Comments Pt demonstrates positive and healthy coping skills.  Good support from family.   Continue Psychosocial Services  No Follow up required      Psychosocial Re-Evaluation:     Psychosocial Re-Evaluation    Branch Name 11/15/16 1158 12/13/16 1421 01/09/17 1704         Psychosocial Re-Evaluation   Current issues with  -  - None Identified     Comments Pt off to a good start.  Denies any needs at this time.  Pt off to a good start.  Denies any needs at this time.   -     Expected Outcomes  -  - demostrates positivey and healthy coping skills, humorous outlook on life     Interventions Encouraged to attend Cardiac Rehabilitation for the exercise Encouraged to attend Cardiac Rehabilitation for the exercise;Stress management education;Relaxation education  -     Continue Psychosocial Services   - No  -        Psychosocial Discharge (Final Psychosocial Re-Evaluation):     Psychosocial Re-Evaluation - 01/09/17 1704      Psychosocial Re-Evaluation   Current issues with None Identified   Expected Outcomes demostrates positivey and healthy coping skills, humorous  outlook on life      Vocational Rehabilitation: Provide vocational rehab assistance to qualifying candidates.   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      Education: Education Goals: Education classes will be provided on a weekly basis, covering required topics. Participant will state understanding/return demonstration of topics presented.  Learning Barriers/Preferences:   Education Topics: Count Your Pulse:  -Group instruction provided by verbal instruction, demonstration, patient participation and written materials to support subject.  Instructors address importance of being able to find your pulse and how to count your pulse when at home without a heart monitor.  Patients get hands on experience counting their pulse with staff help and individually.   Heart Attack, Angina, and Risk Factor Modification:  -Group instruction provided by verbal instruction, video, and written materials to support subject.  Instructors address signs and symptoms of angina and heart attacks.    Also discuss risk factors for heart disease and how to make changes to improve heart health risk factors. Flowsheet Row CARDIAC REHAB PHASE II EXERCISE from 01/09/2017 in [place]  Date  12/05/16  Instruction Review Code  2- meets goals/outcomes      Functional Fitness:  -Group instruction provided by verbal instruction, demonstration, patient participation, and written materials to support subject.  Instructors address safety measures for doing things around the house.  Discuss how to get up and down off the floor, how to pick  things up properly, how to safely get out of a chair without assistance, and balance  training.   Meditation and Mindfulness:  -Group instruction provided by verbal instruction, patient participation, and written materials to support subject.  Instructor addresses importance of mindfulness and meditation practice to help reduce stress and improve awareness.  Instructor also leads participants through a meditation exercise.    Stretching for Flexibility and Mobility:  -Group instruction provided by verbal instruction, patient participation, and written materials to support subject.  Instructors lead participants through series of stretches that are designed to increase flexibility thus improving mobility.  These stretches are additional exercise for major muscle groups that are typically performed during regular warm up and cool down. Flowsheet Row CARDIAC REHAB PHASE II EXERCISE from 01/09/2017 in [place]  Date  12/07/16  Instruction Review Code  2- meets goals/outcomes      Hands Only CPR Anytime:  -Group instruction provided by verbal instruction, video, patient participation and written materials to support subject.  Instructors co-teach with [facility] video for hands only CPR.  Participants get hands on experience with mannequins.   Nutrition I class: Heart Healthy Eating:  -Group instruction provided by PowerPoint slides, verbal discussion, and written materials to support subject matter. The instructor gives an explanation and review of the Therapeutic Lifestyle Changes diet recommendations, which includes a discussion on lipid goals, dietary fat, sodium, fiber, plant stanol/sterol esters, sugar, and the components of a well-balanced, healthy diet. Flowsheet Row CARDIAC REHAB PHASE II EXERCISE from 01/09/2017 in [place]  Date  12/18/16  Educator  RD  Instruction Review Code  2- meets goals/outcomes      Nutrition II class: Lifestyle Skills:  -Group instruction provided by PowerPoint slides, verbal  discussion, and written materials to support subject matter. The instructor gives an explanation and review of label reading, grocery shopping for heart health, heart healthy recipe modifications, and ways to make healthier choices when eating out. Flowsheet Row CARDIAC REHAB PHASE II EXERCISE from 01/09/2017 in [place]  Date  12/25/16  Educator  RD  Instruction Review Code  2- meets goals/outcomes      Diabetes Question & Answer:  -Group instruction provided by PowerPoint slides, verbal discussion, and written materials to support subject matter. The instructor gives an explanation and review of diabetes co-morbidities, pre- and post-prandial blood glucose goals, pre-exercise blood glucose goals, signs, symptoms, and treatment of hypoglycemia and hyperglycemia, and foot care basics.   Diabetes Blitz:  -Group instruction provided by PowerPoint slides, verbal discussion, and written materials to support subject matter. The instructor gives an explanation and review of the physiology behind type 1 and type 2 diabetes, diabetes medications and rational behind using different medications, pre- and post-prandial blood glucose recommendations and Hemoglobin A1c goals, diabetes diet, and exercise including blood glucose guidelines for exercising safely.    Portion Distortion:  -Group instruction provided by PowerPoint slides, verbal discussion, written materials, and food models to support subject matter. The instructor gives an explanation of serving size versus portion size, changes in portions sizes over the last 20 years, and what consists of a serving from each food group. Flowsheet Row CARDIAC REHAB PHASE II EXERCISE from 01/09/2017 in [place]  Date  01/09/17  Educator  RD  Instruction Review Code  2- meets goals/outcomes      Stress Management:  -Group instruction provided by verbal instruction, video, and written materials  to support subject matter.  Instructors review role of stress in heart disease and how to cope with stress positively.     Exercising on Your Own:  -Group instruction provided by verbal instruction, power point, and written materials to support subject.  Instructors discuss benefits of exercise, components of exercise, frequency and intensity of exercise, and end points for exercise.  Also discuss use of nitroglycerin and activating EMS.  Review options of places to exercise outside of rehab.  Review guidelines for sex with heart disease. Flowsheet Row CARDIAC REHAB PHASE II EXERCISE from 01/09/2017 in [place]  Date  12/12/16  Instruction Review Code  2- meets goals/outcomes      Cardiac Drugs I:  -Group instruction provided by verbal instruction and written materials to support subject.  Instructor reviews cardiac drug classes: antiplatelets, anticoagulants, beta blockers, and statins.  Instructor discusses reasons, side effects, and lifestyle considerations for each drug class. Flowsheet Row CARDIAC REHAB PHASE II EXERCISE from 01/09/2017 in [place]  Date  12/26/16  Instruction Review Code  2- meets goals/outcomes      Cardiac Drugs II:  -Group instruction provided by verbal instruction and written materials to support subject.  Instructor reviews cardiac drug classes: angiotensin converting enzyme inhibitors (ACE-I), angiotensin II receptor blockers (ARBs), nitrates, and calcium channel blockers.  Instructor discusses reasons, side effects, and lifestyle considerations for each drug class. Flowsheet Row CARDIAC REHAB PHASE II EXERCISE from 01/09/2017 in [place]  Date  11/21/16  Instruction Review Code  2- meets goals/outcomes      Anatomy and Physiology of the Circulatory System:  -Group instruction provided by verbal instruction, video, and written materials to support subject.   Reviews functional anatomy of heart, how it relates to various diagnoses, and what role the heart plays in the overall system.   Knowledge Questionnaire Score:     Knowledge Questionnaire Score - 11/01/16 0947      Knowledge Questionnaire Score   Pre Score 18/24      Core Components/Risk Factors/Patient Goals at Admission:     Personal Goals and Risk Factors at Admission - 11/01/16 0850      Core Components/Risk Factors/Patient Goals on Admission    Weight Management Yes;Weight Loss   Intervention Weight Management: Develop a combined nutrition and exercise program designed to reach desired caloric intake, while maintaining appropriate intake of nutrient and fiber, sodium and fats, and appropriate energy expenditure required for the weight goal.;Weight Management: Provide education and appropriate resources to help participant work on and attain dietary goals.;Weight Management/Obesity: Establish reasonable short term and long term weight goals.;Obesity: Provide education and appropriate resources to help participant work on and attain dietary goals.   Expected Outcomes Short Term: Continue to assess and modify interventions until short term weight is achieved;Long Term: Adherence to nutrition and physical activity/exercise program aimed toward attainment of established weight goal;Weight Maintenance: Understanding of the daily nutrition guidelines, which includes 25-35% calories from fat, 7% or less cal from saturated fats, less than 243m cholesterol, less than 1.5gm of sodium, & 5 or more servings of fruits and vegetables daily;Weight Loss: Understanding of general recommendations for a balanced deficit meal plan, which promotes 1-2 lb weight loss per week and includes a negative energy balance of [phone] kcal/d;Understanding recommendations for meals to include 15-35% energy as protein, 25-35% energy from fat, 35-60% energy from carbohydrates, less than 2055mof dietary cholesterol, 20-35  gm of total fiber  daily;Understanding of distribution of calorie intake throughout the day with the consumption of 4-5 meals/snacks   Sedentary Yes   Intervention Provide advice, education, support and counseling about physical activity/exercise needs.;Develop an individualized exercise prescription for aerobic and resistive training based on initial evaluation findings, risk stratification, comorbidities and participant's personal goals.   Expected Outcomes Achievement of increased cardiorespiratory fitness and enhanced flexibility, muscular endurance and strength shown through measurements of functional capacity and personal statement of participant.   Increase Strength and Stamina Yes   Intervention Develop an individualized exercise prescription for aerobic and resistive training based on initial evaluation findings, risk stratification, comorbidities and participant's personal goals.;Provide advice, education, support and counseling about physical activity/exercise needs.   Expected Outcomes Achievement of increased cardiorespiratory fitness and enhanced flexibility, muscular endurance and strength shown through measurements of functional capacity and personal statement of participant.   Improve shortness of breath with ADL's Yes   Intervention Provide education, individualized exercise plan and daily activity instruction to help decrease symptoms of SOB with activities of daily living.   Expected Outcomes Short Term: Achieves a reduction of symptoms when performing activities of daily living.   Hypertension Yes   Intervention Provide education on lifestyle modifcations including regular physical activity/exercise, weight management, moderate sodium restriction and increased consumption of fresh fruit, vegetables, and low fat dairy, alcohol moderation, and smoking cessation.;Monitor prescription use compliance.   Expected Outcomes Short Term: Continued assessment and intervention until BP is <  140/55m HG in hypertensive participants. < 130/84mHG in hypertensive participants with diabetes, heart failure or chronic kidney disease.;Long Term: Maintenance of blood pressure at goal levels.   Lipids Yes   Expected Outcomes Short Term: Participant states understanding of desired cholesterol values and is compliant with medications prescribed. Participant is following exercise prescription and nutrition guidelines.;Long Term: Cholesterol controlled with medications as prescribed, with individualized exercise RX and with personalized nutrition plan. Value goals: LDL < 7054mHDL > 40 mg.   Personal Goal Other Yes   Personal Goal Learn prevention or progression of CVD and be able to live longer and play with grandchildren   Intervention Provide cardiac education classes to increase knowledge and cardiac awareness. Provide exercise programming to assist with improving exercise tolerance and activity levels   Expected Outcomes Pt will have increased cardiac awareness and improved exercise tolerance to be able to live a healthier lifestyle and play with grandchildren.      Core Components/Risk Factors/Patient Goals Review:      Goals and Risk Factor Review    Row Name 12/10/16 1100 01/07/17 0844           Core Components/Risk Factors/Patient Goals Review   Personal Goals Review Increase Strength and Stamina;Other Other      Review Pt states he feels like his "old self" prior to his event.  He also states he feels like he has more energy and less fatigue with exertion.  Pt is doing well with exercise and states he fells like he has more energy and strength.  He is able to do more without fatigue      Expected Outcomes Increase cardiovascular fitness and nutrition with CRPII, HEP and education classes  Increase cardiovascular fitness and nutrition with CRPII, HEP and education classes          Core Components/Risk Factors/Patient Goals at Discharge (Final Review):      Goals and Risk  Factor Review - 01/07/17 0844      Core Components/Risk Factors/Patient Goals Review  Personal Goals Review Other   Review Pt is doing well with exercise and states he fells like he has more energy and strength.  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  Pt met goals and outcomes Pt attended Hypertension education class on 12/14/16.  Pt met goals and outcomes        Comments:  Pt is making expected progress toward personal goals after completing 20 sessions.  Psychosocial Assessment Pt has humorous personality and is seen interacting with fellow participants.  Pt has supportive wife and he enjoys working and traveling out of town for work. Recommend continued exercise and life style modification education including  stress management and relaxation techniques to decrease cardiac risk profile. Cherre Huger, BSN

## 2017-01-11 ENCOUNTER — Encounter (HOSPITAL_COMMUNITY)
Admission: RE | Admit: 2017-01-11 | Discharge: 2017-01-11 | Disposition: A | Discharge status: Home / Self Care | Payer: No Typology Code available for payment source | Source: Ambulatory Visit | Attending: Cardiovascular Disease | Admitting: Cardiovascular Disease

## 2017-01-11 DIAGNOSIS — Z955 Presence of coronary angioplasty implant and graft: Secondary | ICD-10-CM

## 2017-01-14 ENCOUNTER — Encounter (HOSPITAL_COMMUNITY)
Admission: RE | Admit: 2017-01-14 | Discharge: 2017-01-14 | Disposition: A | Discharge status: Home / Self Care | Payer: No Typology Code available for payment source | Source: Ambulatory Visit | Attending: Cardiovascular Disease | Admitting: Cardiovascular Disease

## 2017-01-14 DIAGNOSIS — Z955 Presence of coronary angioplasty implant and graft: Secondary | ICD-10-CM | POA: Diagnosis not present

## 2017-01-16 ENCOUNTER — Encounter (HOSPITAL_COMMUNITY): Payer: No Typology Code available for payment source

## 2017-01-18 ENCOUNTER — Encounter (HOSPITAL_COMMUNITY): Payer: No Typology Code available for payment source

## 2017-01-21 ENCOUNTER — Encounter (HOSPITAL_COMMUNITY)
Admission: RE | Admit: 2017-01-21 | Discharge: 2017-01-21 | Disposition: A | Discharge status: Home / Self Care | Payer: No Typology Code available for payment source | Source: Ambulatory Visit | Attending: Cardiovascular Disease | Admitting: Cardiovascular Disease

## 2017-01-21 DIAGNOSIS — Z955 Presence of coronary angioplasty implant and graft: Secondary | ICD-10-CM | POA: Diagnosis not present

## 2017-01-23 ENCOUNTER — Encounter (HOSPITAL_COMMUNITY)
Admission: RE | Admit: 2017-01-23 | Discharge: 2017-01-23 | Disposition: A | Discharge status: Home / Self Care | Payer: No Typology Code available for payment source | Source: Ambulatory Visit | Attending: Cardiovascular Disease | Admitting: Cardiovascular Disease

## 2017-01-23 DIAGNOSIS — Z955 Presence of coronary angioplasty implant and graft: Secondary | ICD-10-CM | POA: Diagnosis not present

## 2017-01-24 DIAGNOSIS — K2100 Gastro-esophageal reflux disease with esophagitis, without bleeding: Secondary | ICD-10-CM | POA: Insufficient documentation

## 2017-01-25 ENCOUNTER — Encounter (HOSPITAL_COMMUNITY): Payer: No Typology Code available for payment source

## 2017-01-28 ENCOUNTER — Encounter (HOSPITAL_COMMUNITY)
Admission: RE | Admit: 2017-01-28 | Discharge: 2017-01-28 | Disposition: A | Discharge status: Home / Self Care | Payer: No Typology Code available for payment source | Source: Ambulatory Visit | Attending: Cardiovascular Disease | Admitting: Cardiovascular Disease

## 2017-01-28 DIAGNOSIS — Z955 Presence of coronary angioplasty implant and graft: Secondary | ICD-10-CM | POA: Diagnosis not present

## 2017-02-04 ENCOUNTER — Encounter (HOSPITAL_COMMUNITY)
Admission: RE | Admit: 2017-02-04 | Discharge: 2017-02-04 | Disposition: A | Discharge status: Home / Self Care | Payer: No Typology Code available for payment source | Source: Ambulatory Visit | Attending: Cardiovascular Disease | Admitting: Cardiovascular Disease

## 2017-02-04 DIAGNOSIS — Z955 Presence of coronary angioplasty implant and graft: Secondary | ICD-10-CM | POA: Diagnosis not present

## 2017-02-05 NOTE — Progress Notes (Signed)
Cardiac Individual Treatment Plan  Patient Details  Name: Sean Schultz MRN: [id number] Date of Birth: 03/24/1951 Referring Provider:     [facility] from 11/01/2016 in [place]  Referring Provider  Darlina Guys MD      Initial Encounter Date:    CARDIAC REHAB PHASE II ORIENTATION from 11/01/2016 in [place]  Date  11/01/16  Referring Provider  Darlina Guys MD      Visit Diagnosis: 09/24/16 Status post coronary artery stent placement  Patient's Home Medications on Admission:  Current Outpatient Prescriptions:  .  aspirin EC 81 MG tablet, Take 81 mg by mouth daily. , Disp: , Rfl:  .  azelastine (OPTIVAR) 0.05 % ophthalmic solution, Place 1 drop into both eyes daily. , Disp: , Rfl: 0 .  calcium carbonate (TUMS - DOSED IN MG ELEMENTAL CALCIUM) 500 MG chewable tablet, Chew 1 tablet by mouth 2 (two) times daily., Disp: , Rfl:  .  clopidogrel (PLAVIX) 75 MG tablet, Take 1 tablet (75 mg total) by mouth daily., Disp: 30 tablet, Rfl: 11 .  Coenzyme Q10 (CO Q 10 PO), Take 1 capsule by mouth 2 (two) times daily., Disp: , Rfl:  .  Cyanocobalamin (RA VITAMIN B-12 TR) 1000 MCG TBCR, Take 1,000 mcg by mouth daily. , Disp: , Rfl:  .  fenofibrate 160 MG tablet, Take 160 mg by mouth daily., Disp: , Rfl: 0 .  fluticasone (VERAMYST) 27.5 MCG/SPRAY nasal spray, Place 1 spray into the nose 2 (two) times daily., Disp: , Rfl:  .  folic acid (FOLVITE) 1 MG tablet, Take 1 mg by mouth 2 (two) times daily., Disp: , Rfl:  .  Melatonin 3 MG TABS, Take 3 mg by mouth at bedtime as needed (sleep)., Disp: , Rfl:  .  metoprolol tartrate (LOPRESSOR) 25 MG tablet, Take 1 tablet (25 mg total) by mouth 2 (two) times daily., Disp: 60 tablet, Rfl: 6 .  montelukast (SINGULAIR) 10 MG tablet, Take 10 mg by mouth daily. , Disp: , Rfl: 1 .  NIACIN PO, Take 1 tablet by mouth daily. , Disp: , Rfl:  .  nitroGLYCERIN (NITROSTAT) 0.4  MG SL tablet, Place 1 tablet (0.4 mg total) under the tongue every 5 (five) minutes as needed., Disp: 25 tablet, Rfl: 3 .  Omega-3 Fatty Acids (FISH OIL PO), Take 1 capsule by mouth daily., Disp: , Rfl:  .  Plant Sterol Stanol-Pantethine (CHOLESTOFF COMPLETE) 300-100 MG CAPS, Take 2 capsules by mouth every evening. , Disp: , Rfl:  .  valsartan (DIOVAN) 160 MG tablet, Take 160 mg by mouth daily. , Disp: , Rfl: 1  Past Medical History: Past Medical History:  Diagnosis Date  . BPH (benign prostatic hypertrophy)   . Coronary artery disease   . GERD (gastroesophageal reflux disease)   . H/O seasonal allergies   . Hyperlipemia   . Hypertension   . Prostatitis     Tobacco Use: History  Smoking Status  . Former Smoker  . Packs/day: 0.50  . Years: 12.00  . Types: Cigarettes  . Quit date: 11/17/1978  Smokeless Tobacco  . Never Used    Labs: Recent Review Flowsheet Data    There is no flowsheet data to display.      Capillary Blood Glucose: Lab Results  Component Value Date   GLUCAP 130 (H) 07/14/2016     Exercise Target Goals:    Exercise Program Goal: Individual exercise prescription set with THRR,  safety & activity barriers. Participant demonstrates ability to understand and report RPE using BORG scale, to self-measure pulse accurately, and to acknowledge the importance of the exercise prescription.  Exercise Prescription Goal: Starting with aerobic activity 30 plus minutes a day, 3 days per week for initial exercise prescription. Provide home exercise prescription and guidelines that participant acknowledges understanding prior to discharge.  Activity Barriers & Risk Stratification:     Activity Barriers & Cardiac Risk Stratification - 11/01/16 0850      Activity Barriers & Cardiac Risk Stratification   Activity Barriers Other (comment)   Comments R artificial hip   Cardiac Risk Stratification High      6 Minute Walk:     6 Minute Walk    Row Name 11/01/16  0950 11/01/16 1004       6 Minute Walk   Phase Initial  -    Distance 1552 feet  -    Walk Time 6 minutes  -    # of Rest Breaks 0  -    MPH  - 2.9    METS  - 3.3    RPE 19  -    VO2 Peak  - 11.7    Symptoms No  -    Resting HR 63 bpm  -    Resting BP 122/76  -    Max Ex. HR 87 bpm  -    Max Ex. BP 132/76  -    2 Minute Post BP 124/82  -       Oxygen Initial Assessment:   Oxygen Re-Evaluation:   Oxygen Discharge (Final Oxygen Re-Evaluation):   Initial Exercise Prescription:     Initial Exercise Prescription - 11/01/16 1000      Date of Initial Exercise RX and Referring Provider   Date 11/01/16   Referring Provider Darlina Guys MD     Bike   Level 0.8   Minutes 10   METs 2.89     NuStep   Level 2   Minutes 10   METs 2.3     Track   Laps 11   Minutes 10   METs 2.92     Prescription Details   Frequency (times per week) 3   Duration Progress to 45 minutes of aerobic exercise without signs/symptoms of physical distress     Intensity   THRR 40-80% of Max Heartrate 61-122   Ratings of Perceived Exertion 11-13   Perceived Dyspnea 0-4     Progression   Progression Continue to progress workloads to maintain intensity without signs/symptoms of physical distress.     Resistance Training   Training Prescription Yes   Weight 3   Reps 10-12      Perform Capillary Blood Glucose checks as needed.  Exercise Prescription Changes:     Exercise Prescription Changes    Row Name 12/03/16 1400 01/07/17 0800 01/28/17 1600         Response to Exercise   Blood Pressure (Admit) 133/84 130/80 116/78     Blood Pressure (Exercise) 142/80 138/80 122/70     Blood Pressure (Exit) 120/82 122/80 104/70     Heart Rate (Admit) 72 bpm 68 bpm 57 bpm     Heart Rate (Exercise) 99 bpm 100 bpm 103 bpm     Heart Rate (Exit) 67 bpm 68 bpm 67 bpm     Rating of Perceived Exertion (Exercise) '12 11 13     '$ Duration Progress to 45 minutes of aerobic exercise without  signs/symptoms  of physical distress Progress to 45 minutes of aerobic exercise without signs/symptoms of physical distress Progress to 45 minutes of aerobic exercise without signs/symptoms of physical distress     Intensity THRR unchanged THRR unchanged THRR unchanged       Progression   Progression Continue to progress workloads to maintain intensity without signs/symptoms of physical distress. Continue to progress workloads to maintain intensity without signs/symptoms of physical distress. Continue to progress workloads to maintain intensity without signs/symptoms of physical distress.     Average METs 3.8 3.9 4.3       Resistance Training   Training Prescription Yes Yes Yes     Weight 5lb 5lb 5lb     Reps 10-12 10-15 10-15       Bike   Level 1.5 1.5 1.5     Minutes '10 10 10     '$ METs 4.52 4.52 4.57       NuStep   Level '4 4 5     '$ Minutes '10 10 10     '$ METs 2.8 3.1 4.5       Track   Laps '16 18 18     '$ Minutes '10 10 10     '$ METs 3.79 4.14 4.14       Home Exercise Plan   Plans to continue exercise at [place] (comment) Home (comment)     Frequency Add 4 additional days to program exercise sessions. Add 4 additional days to program exercise sessions. Add 4 additional days to program exercise sessions.        Exercise Comments:     Exercise Comments    Row Name 11/14/16 0818 12/10/16 1100 01/07/17 0843 02/04/17 1610     Exercise Comments Pt is off to a great start with exercise Reviewed METs and goals with pt.  Pt is doing well with exercise  Reviewed METs and goals with pt.  Pt is doing well with exercise  Reviewed METs and goals with pt.  Pt continues to do well with exercise.        Exercise Goals and Review:     Exercise Goals    Row Name 01/07/17 [phone]             Exercise Goals   Increase Physical Activity Yes       Intervention Provide advice, education, support and counseling about physical activity/exercise needs.;Develop an individualized exercise  prescription for aerobic and resistive training based on initial evaluation findings, risk stratification, comorbidities and participant's personal goals.       Expected Outcomes Achievement of increased cardiorespiratory fitness and enhanced flexibility, muscular endurance and strength shown through measurements of functional capacity and personal statement of participant.       Increase Strength and Stamina Yes       Intervention Provide advice, education, support and counseling about physical activity/exercise needs.;Develop an individualized exercise prescription for aerobic and resistive training based on initial evaluation findings, risk stratification, comorbidities and participant's personal goals.       Expected Outcomes Achievement of increased cardiorespiratory fitness and enhanced flexibility, muscular endurance and strength shown through measurements of functional capacity and personal statement of participant.          Exercise Goals Re-Evaluation :     Exercise Goals Re-Evaluation    Row Name 01/09/17 1637 01/09/17 1638 02/04/17 1608         Exercise Goal Re-Evaluation   Exercise Goals Review Increase Strenth and Stamina;Increase Physical Activity  - Increase Strenth and Stamina;Increase Physical  Activity     Comments Pt is doing well with exercise and states he fells like he has more energy and strength.  He is able to do more without fatigue  - Pt is doing well with exericse.  Although he frequently misses class due to travel for work, he states that he feels more stable and his one-leg balance has improved on each leg by 15-20 seconds.       Expected Outcomes  - Increase cardiovascular fitness and nutrition with CRPII, HEP and education classes  Continue with exercise prescription and home exercise program in order to continue to increase cardiorespiratory fitness.          Discharge Exercise Prescription (Final Exercise Prescription Changes):     Exercise Prescription  Changes - 01/28/17 1600      Response to Exercise   Blood Pressure (Admit) 116/78   Blood Pressure (Exercise) 122/70   Blood Pressure (Exit) 104/70   Heart Rate (Admit) 57 bpm   Heart Rate (Exercise) 103 bpm   Heart Rate (Exit) 67 bpm   Rating of Perceived Exertion (Exercise) 13   Duration Progress to 45 minutes of aerobic exercise without signs/symptoms of physical distress   Intensity THRR unchanged     Progression   Progression Continue to progress workloads to maintain intensity without signs/symptoms of physical distress.   Average METs 4.3     Resistance Training   Training Prescription Yes   Weight 5lb   Reps 10-15     Bike   Level 1.5   Minutes 10   METs 4.57     NuStep   Level 5   Minutes 10   METs 4.5     Track   Laps 18   Minutes 10   METs 4.14     Home Exercise Plan   Plans to continue exercise at Home (comment)   Frequency Add 4 additional days to program exercise sessions.      Nutrition:  Target Goals: Understanding of nutrition guidelines, daily intake of sodium '1500mg'$ , cholesterol '200mg'$ , calories 30% from fat and 7% or less from saturated fats, daily to have 5 or more servings of fruits and vegetables.  Biometrics:     Pre Biometrics - 11/01/16 1028      Pre Biometrics   % Body Fat 25.8 %       Nutrition Therapy Plan and Nutrition Goals:     Nutrition Therapy & Goals - 12/14/16 0809      Nutrition Therapy   Diet Therapeutic Lifestyle Changes     Personal Nutrition Goals   Nutrition Goal Pt wants to lose 6-15 lb while in [place], educate and counsel regarding individualized specific dietary modifications aiming towards targeted core components such as weight, hypertension, lipid management, diabetes, heart failure and other comorbidities.   Expected Outcomes Short Term Goal: Understand basic principles of dietary content, such as calories, fat, sodium, cholesterol and  nutrients.;Long Term Goal: Adherence to prescribed nutrition plan.      Nutrition Discharge: Nutrition Scores:   Nutrition Goals Re-Evaluation:   Nutrition Goals Re-Evaluation:   Nutrition Goals Discharge (Final Nutrition Goals Re-Evaluation):   Psychosocial: Target Goals: Acknowledge presence or absence of significant depression and/or stress, maximize coping skills, provide positive support system. Participant is able to verbalize types and ability to use techniques and skills needed for reducing stress and depression.  Initial Review & Psychosocial Screening:     Initial Psych  Review & Screening - 11/15/16 1156      Barriers   Psychosocial barriers to participate in program There are no identifiable barriers or psychosocial needs.      Quality of Life Scores:     Quality of Life - 11/01/16 1036      Quality of Life Scores   Health/Function Pre 26.89 %   Socioeconomic Pre 27.43 %   Psych/Spiritual Pre 28.29 %   Family Pre 28.8 %   GLOBAL Pre 27.59 %      PHQ-9: Recent Review Flowsheet Data    Depression screen [facility] 2/9 11/07/2016   Decreased Interest 1   Down, Depressed, Hopeless 1   PHQ - 2 Score 2     Interpretation of Total Score  Total Score Depression Severity:  1-4 = Minimal depression, 5-9 = Mild depression, 10-14 = Moderate depression, 15-19 = Moderately severe depression, 20-27 = Severe depression   Psychosocial Evaluation and Intervention:     Psychosocial Evaluation - 01/09/17 1703      Psychosocial Evaluation & Interventions   Interventions Encouraged to exercise with the program and follow exercise prescription;Stress management education;Relaxation education   Comments Pt demonstrates positive and healthy coping skills.  Good support from family.   Continue Psychosocial Services  No Follow up required      Psychosocial Re-Evaluation:     Psychosocial Re-Evaluation    [place] Name 11/15/16 1158 12/13/16 1421 01/09/17 1704 01/28/17 1220        Psychosocial Re-Evaluation   Current issues with  -  - None Identified None Identified    Comments Pt off to a good start.  Denies any needs at this time.  Pt off to a good start.  Denies any needs at this time.   - no psychosocial needs identified, no interventions necessary. pt reports dyspnea and fatigue have improved. 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      Expected Outcomes  -  - demostrates positivey and healthy coping skills, humorous outlook on life pt will continue to demonstrate positive, healthy coping skills.      Interventions Encouraged to attend Cardiac Rehabilitation for the exercise Encouraged to attend Cardiac Rehabilitation for the exercise;Stress management education;Relaxation education  - Encouraged to attend Cardiac Rehabilitation for the exercise    Continue Psychosocial Services   - No  - No Follow up required       Psychosocial Discharge (Final Psychosocial Re-Evaluation):     Psychosocial Re-Evaluation - 01/28/17 1220      Psychosocial Re-Evaluation   Current issues with None Identified   Comments no psychosocial needs identified, no interventions necessary. pt reports dyspnea and fatigue have improved. 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     Expected Outcomes pt will continue to demonstrate positive, healthy coping skills.     Interventions Encouraged to attend Cardiac Rehabilitation for the exercise   Continue Psychosocial Services  No Follow up required      Vocational Rehabilitation: Provide vocational rehab assistance to qualifying candidates.   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      Education: Education Goals:  Education classes will be provided on a weekly basis, covering required  topics. Participant will state understanding/return demonstration of topics presented.  Learning Barriers/Preferences:   Education Topics: Count Your Pulse:  -Group instruction provided by verbal instruction, demonstration, patient participation and written materials to support subject.  Instructors address importance of being able to find your pulse and how to count your pulse when at home without a heart monitor.  Patients get hands on experience counting their pulse with staff help and individually.   Heart Attack, Angina, and Risk Factor Modification:  -Group instruction provided by verbal instruction, video, and written materials to support subject.  Instructors address signs and symptoms of angina and heart attacks.    Also discuss risk factors for heart disease and how to make changes to improve heart health risk factors.   CARDIAC REHAB PHASE II EXERCISE from 01/23/2017 in [place]  Date  12/05/16  Instruction Review Code  2- meets goals/outcomes      Functional Fitness:  -Group instruction provided by verbal instruction, demonstration, patient participation, and written materials to support subject.  Instructors address safety measures for doing things around the house.  Discuss how to get up and down off the floor, how to pick things up properly, how to safely get out of a chair without assistance, and balance training.   Meditation and Mindfulness:  -Group instruction provided by verbal instruction, patient participation, and written materials to support subject.  Instructor addresses importance of mindfulness and meditation practice to help reduce stress and improve awareness.  Instructor also leads participants through a meditation exercise.    Stretching for Flexibility and Mobility:  -Group instruction provided by verbal instruction, patient participation, and written  materials to support subject.  Instructors lead participants through series of stretches that are designed to increase flexibility thus improving mobility.  These stretches are additional exercise for major muscle groups that are typically performed during regular warm up and cool down.   CARDIAC REHAB PHASE II EXERCISE from 01/23/2017 in [place]  Date  12/07/16  Instruction Review Code  2- meets goals/outcomes      Hands Only CPR Anytime:  -Group instruction provided by verbal instruction, video, patient participation and written materials to support subject.  Instructors co-teach with AHA video for hands only CPR.  Participants get hands on experience with mannequins.   Nutrition I class: Heart Healthy Eating:  -Group instruction provided by PowerPoint slides, verbal discussion, and written materials to support subject matter. The instructor gives an explanation and review of the Therapeutic Lifestyle Changes diet recommendations, which includes a discussion on lipid goals, dietary fat, sodium, fiber, plant stanol/sterol esters, sugar, and the components of a well-balanced, healthy diet.   CARDIAC REHAB PHASE II EXERCISE from 01/23/2017 in [place]  Date  12/18/16  Educator  RD  Instruction Review Code  2- meets goals/outcomes      Nutrition II class: Lifestyle Skills:  -Group instruction provided by PowerPoint slides, verbal discussion, and written materials to support subject matter. The instructor gives an explanation and review of label reading, grocery shopping for heart health, heart healthy recipe modifications, and ways to make healthier choices when eating out.   CARDIAC REHAB PHASE II EXERCISE from 01/23/2017 in [place]  Date  12/25/16  Educator  RD  Instruction Review Code  2- meets goals/outcomes      Diabetes Question & Answer:  -Group instruction provided by PowerPoint  slides, verbal discussion, and written materials to  support subject matter. The instructor gives an explanation and review of diabetes co-morbidities, pre- and post-prandial blood glucose goals, pre-exercise blood glucose goals, signs, symptoms, and treatment of hypoglycemia and hyperglycemia, and foot care basics.   Diabetes Blitz:  -Group instruction provided by PowerPoint slides, verbal discussion, and written materials to support subject matter. The instructor gives an explanation and review of the physiology behind type 1 and type 2 diabetes, diabetes medications and rational behind using different medications, pre- and post-prandial blood glucose recommendations and Hemoglobin A1c goals, diabetes diet, and exercise including blood glucose guidelines for exercising safely.    Portion Distortion:  -Group instruction provided by PowerPoint slides, verbal discussion, written materials, and food models to support subject matter. The instructor gives an explanation of serving size versus portion size, changes in portions sizes over the last 20 years, and what consists of a serving from each food group.   CARDIAC REHAB PHASE II EXERCISE from 01/23/2017 in [place]  Date  01/09/17  Educator  RD  Instruction Review Code  2- meets goals/outcomes      Stress Management:  -Group instruction provided by verbal instruction, video, and written materials to support subject matter.  Instructors review role of stress in heart disease and how to cope with stress positively.     Exercising on Your Own:  -Group instruction provided by verbal instruction, power point, and written materials to support subject.  Instructors discuss benefits of exercise, components of exercise, frequency and intensity of exercise, and end points for exercise.  Also discuss use of nitroglycerin and activating EMS.  Review options of places to exercise outside of rehab.  Review guidelines for sex  with heart disease.   CARDIAC REHAB PHASE II EXERCISE from 01/23/2017 in [place]  Date  12/12/16  Instruction Review Code  2- meets goals/outcomes      Cardiac Drugs I:  -Group instruction provided by verbal instruction and written materials to support subject.  Instructor reviews cardiac drug classes: antiplatelets, anticoagulants, beta blockers, and statins.  Instructor discusses reasons, side effects, and lifestyle considerations for each drug class.   CARDIAC REHAB PHASE II EXERCISE from 01/23/2017 in [place]  Date  12/26/16  Instruction Review Code  2- meets goals/outcomes      Cardiac Drugs II:  -Group instruction provided by verbal instruction and written materials to support subject.  Instructor reviews cardiac drug classes: angiotensin converting enzyme inhibitors (ACE-I), angiotensin II receptor blockers (ARBs), nitrates, and calcium channel blockers.  Instructor discusses reasons, side effects, and lifestyle considerations for each drug class.   CARDIAC REHAB PHASE II EXERCISE from 01/23/2017 in [place]  Date  01/23/17  Educator  Kennyth Lose  Instruction Review Code  2- meets goals/outcomes      Anatomy and Physiology of the Circulatory System:  -Group instruction provided by verbal instruction, video, and written materials to support subject.  Reviews functional anatomy of heart, how it relates to various diagnoses, and what role the heart plays in the overall system.   Knowledge Questionnaire Score:     Knowledge Questionnaire Score - 11/01/16 0947      Knowledge Questionnaire Score   Pre Score 18/24      Core Components/Risk Factors/Patient Goals at Admission:     Personal Goals and Risk Factors at Admission - 11/01/16 0850      Core Components/Risk Factors/Patient Goals on Admission    Weight Management  Yes;Weight Loss   Intervention Weight Management: Develop a  combined nutrition and exercise program designed to reach desired caloric intake, while maintaining appropriate intake of nutrient and fiber, sodium and fats, and appropriate energy expenditure required for the weight goal.;Weight Management: Provide education and appropriate resources to help participant work on and attain dietary goals.;Weight Management/Obesity: Establish reasonable short term and long term weight goals.;Obesity: Provide education and appropriate resources to help participant work on and attain dietary goals.   Expected Outcomes Short Term: Continue to assess and modify interventions until short term weight is achieved;Long Term: Adherence to nutrition and physical activity/exercise program aimed toward attainment of established weight goal;Weight Maintenance: Understanding of the daily nutrition guidelines, which includes 25-35% calories from fat, 7% or less cal from saturated fats, less than '200mg'$  cholesterol, less than 1.5gm of sodium, & 5 or more servings of fruits and vegetables daily;Weight Loss: Understanding of general recommendations for a balanced deficit meal plan, which promotes 1-2 lb weight loss per week and includes a negative energy balance of [phone] kcal/d;Understanding recommendations for meals to include 15-35% energy as protein, 25-35% energy from fat, 35-60% energy from carbohydrates, less than '200mg'$  of dietary cholesterol, 20-35 gm of total fiber daily;Understanding of distribution of calorie intake throughout the day with the consumption of 4-5 meals/snacks   Sedentary Yes   Intervention Provide advice, education, support and counseling about physical activity/exercise needs.;Develop an individualized exercise prescription for aerobic and resistive training based on initial evaluation findings, risk stratification, comorbidities and participant's personal goals.   Expected Outcomes Achievement of increased cardiorespiratory fitness and enhanced flexibility, muscular  endurance and strength shown through measurements of functional capacity and personal statement of participant.   Increase Strength and Stamina Yes   Intervention Develop an individualized exercise prescription for aerobic and resistive training based on initial evaluation findings, risk stratification, comorbidities and participant's personal goals.;Provide advice, education, support and counseling about physical activity/exercise needs.   Expected Outcomes Achievement of increased cardiorespiratory fitness and enhanced flexibility, muscular endurance and strength shown through measurements of functional capacity and personal statement of participant.   Improve shortness of breath with ADL's Yes   Intervention Provide education, individualized exercise plan and daily activity instruction to help decrease symptoms of SOB with activities of daily living.   Expected Outcomes Short Term: Achieves a reduction of symptoms when performing activities of daily living.   Hypertension Yes   Intervention Provide education on lifestyle modifcations including regular physical activity/exercise, weight management, moderate sodium restriction and increased consumption of fresh fruit, vegetables, and low fat dairy, alcohol moderation, and smoking cessation.;Monitor prescription use compliance.   Expected Outcomes Short Term: Continued assessment and intervention until BP is < 140/35m HG in hypertensive participants. < 130/843mHG in hypertensive participants with diabetes, heart failure or chronic kidney disease.;Long Term: Maintenance of blood pressure at goal levels.   Lipids Yes   Expected Outcomes Short Term: Participant states understanding of desired cholesterol values and is compliant with medications prescribed. Participant is following exercise prescription and nutrition guidelines.;Long Term: Cholesterol controlled with medications as prescribed, with individualized exercise RX and with personalized nutrition  plan. Value goals: LDL < '70mg'$ , HDL > 40 mg.   Personal Goal Other Yes   Personal Goal Learn prevention or progression of CVD and be able to live longer and play with grandchildren   Intervention Provide cardiac education classes to increase knowledge and cardiac awareness. Provide exercise programming to assist with improving exercise tolerance and activity levels   Expected Outcomes Pt  will have increased cardiac awareness and improved exercise tolerance to be able to live a healthier lifestyle and play with grandchildren.      Core Components/Risk Factors/Patient Goals Review:      Goals and Risk Factor Review    Row Name 12/10/16 1100 01/07/17 0844 02/05/17 1408         Core Components/Risk Factors/Patient Goals Review   Personal Goals Review Increase Strength and Stamina;Other Other Other;Weight Management/Obesity;Lipids;Hypertension     Review Pt states he feels like his "old self" prior to his event.  He also states he feels like he has more energy and less fatigue with exertion.  Pt is doing well with exercise and states he fells like he has more energy and strength.  He is able to do more without fatigue pt vital signs stable with exercise.  pt is tolerating CR activities without difficulty. pt is actively participating in exercise and education opportunities to decrease CAD risk factors.  pt is exercising on his own at home and using hotel gyms when traveling.  pt is able to play with his grandchildren.     Expected Outcomes Increase cardiovascular fitness and nutrition with CRPII, HEP and education classes  Increase cardiovascular fitness and nutrition with CRPII, HEP and education classes  Increase cardiovascular fitness and nutrition with CRPII, HEP and education classes         Core Components/Risk Factors/Patient Goals at Discharge (Final Review):      Goals and Risk Factor Review - 02/05/17 1408      Core Components/Risk Factors/Patient Goals Review   Personal Goals Review  Other;Weight Management/Obesity;Lipids;Hypertension   Review pt vital signs stable with exercise.  pt is tolerating CR activities without difficulty. pt is actively participating in exercise and education opportunities to decrease CAD risk factors.  pt is exercising on his own at home and using hotel gyms when traveling.  pt is able to play with his grandchildren.   Expected Outcomes Increase cardiovascular fitness and nutrition with CRPII, HEP and education classes       ITP Comments:     ITP Comments    Row Name 11/01/16 0840 12/14/16 0918 12/14/16 0919       ITP Comments Dr. Fransico Him, Medical Director t attended Hypertension education class on 12/14/16.  Pt met goals and outcomes Pt attended Hypertension education class on 12/14/16.  Pt met goals and outcomes        Comments:Pt is making expected progress toward personal goals after completing 25 sessions. Recommend continued exercise and life style modification education including  stress management and relaxation techniques to decrease cardiac risk profile.

## 2017-02-06 ENCOUNTER — Encounter (HOSPITAL_COMMUNITY): Admission: RE | Admit: 2017-02-06 | Payer: No Typology Code available for payment source | Source: Ambulatory Visit

## 2017-02-08 ENCOUNTER — Encounter (HOSPITAL_COMMUNITY): Payer: No Typology Code available for payment source

## 2017-02-11 ENCOUNTER — Encounter (HOSPITAL_COMMUNITY): Payer: No Typology Code available for payment source

## 2017-02-13 ENCOUNTER — Encounter (HOSPITAL_COMMUNITY): Admission: RE | Admit: 2017-02-13 | Payer: No Typology Code available for payment source | Source: Ambulatory Visit

## 2017-02-15 ENCOUNTER — Encounter (HOSPITAL_COMMUNITY)
Admission: RE | Admit: 2017-02-15 | Discharge: 2017-02-15 | Disposition: A | Discharge status: Home / Self Care | Payer: No Typology Code available for payment source | Source: Ambulatory Visit | Attending: Cardiovascular Disease | Admitting: Cardiovascular Disease

## 2017-02-15 DIAGNOSIS — Z955 Presence of coronary angioplasty implant and graft: Secondary | ICD-10-CM | POA: Insufficient documentation

## 2017-02-18 ENCOUNTER — Encounter (HOSPITAL_COMMUNITY)
Admission: RE | Admit: 2017-02-18 | Discharge: 2017-02-18 | Disposition: A | Discharge status: Home / Self Care | Payer: No Typology Code available for payment source | Source: Ambulatory Visit | Attending: Cardiovascular Disease | Admitting: Cardiovascular Disease

## 2017-02-18 DIAGNOSIS — Z955 Presence of coronary angioplasty implant and graft: Secondary | ICD-10-CM

## 2017-02-20 ENCOUNTER — Encounter (HOSPITAL_COMMUNITY): Payer: No Typology Code available for payment source

## 2017-02-22 ENCOUNTER — Encounter (HOSPITAL_COMMUNITY)
Admission: RE | Admit: 2017-02-22 | Discharge: 2017-02-22 | Disposition: A | Discharge status: Home / Self Care | Payer: No Typology Code available for payment source | Source: Ambulatory Visit | Attending: Cardiovascular Disease | Admitting: Cardiovascular Disease

## 2017-02-22 DIAGNOSIS — Z955 Presence of coronary angioplasty implant and graft: Secondary | ICD-10-CM | POA: Diagnosis not present

## 2017-02-25 ENCOUNTER — Encounter (HOSPITAL_COMMUNITY)
Admission: RE | Admit: 2017-02-25 | Discharge: 2017-02-25 | Disposition: A | Discharge status: Home / Self Care | Payer: No Typology Code available for payment source | Source: Ambulatory Visit | Attending: Cardiovascular Disease | Admitting: Cardiovascular Disease

## 2017-02-25 ENCOUNTER — Other Ambulatory Visit: Payer: Self-pay | Admitting: *Deleted

## 2017-02-25 DIAGNOSIS — Z955 Presence of coronary angioplasty implant and graft: Secondary | ICD-10-CM | POA: Diagnosis not present

## 2017-02-25 DIAGNOSIS — I2510 Atherosclerotic heart disease of native coronary artery without angina pectoris: Secondary | ICD-10-CM

## 2017-02-25 NOTE — Progress Notes (Signed)
Pat, Can you help me with this? Thanks, chris

## 2017-02-27 ENCOUNTER — Encounter (HOSPITAL_COMMUNITY)
Admission: RE | Admit: 2017-02-27 | Discharge: 2017-02-27 | Disposition: A | Discharge status: Home / Self Care | Payer: No Typology Code available for payment source | Source: Ambulatory Visit | Attending: Cardiovascular Disease | Admitting: Cardiovascular Disease

## 2017-02-27 ENCOUNTER — Telehealth: Payer: Self-pay | Admitting: Cardiovascular Disease

## 2017-02-27 DIAGNOSIS — Z955 Presence of coronary angioplasty implant and graft: Secondary | ICD-10-CM | POA: Diagnosis not present

## 2017-02-27 NOTE — Telephone Encounter (Signed)
Follow Up:   Pt wants to know if Dr Clifton James received paper work from [facility] stating that pt needs to move the Maintenance of Cardiac Rehab.Pt also wants to know did hDr McAlhanye receive a request for clearance  from [facility] to have Physical Therapy?

## 2017-02-27 NOTE — Telephone Encounter (Signed)
Informed patient that Cardiac Rehab form has been received (in CM's box), but PT form from Dr. Amanda Pea has not been received. He states he just saw them today and will be sending the clearance soon. He understands Dr. Clifton James is not here today but paperwork will be signed ASAP. He was grateful for call.

## 2017-02-28 NOTE — Telephone Encounter (Signed)
Cardiac rehab paperwork has been completed and placed in medical records box to be faxed.

## 2017-03-01 ENCOUNTER — Encounter (HOSPITAL_COMMUNITY)
Admission: RE | Admit: 2017-03-01 | Discharge: 2017-03-01 | Disposition: A | Discharge status: Home / Self Care | Payer: No Typology Code available for payment source | Source: Ambulatory Visit | Attending: Cardiovascular Disease | Admitting: Cardiovascular Disease

## 2017-03-01 DIAGNOSIS — Z955 Presence of coronary angioplasty implant and graft: Secondary | ICD-10-CM | POA: Diagnosis not present

## 2017-03-04 ENCOUNTER — Encounter (HOSPITAL_COMMUNITY)
Admission: RE | Admit: 2017-03-04 | Discharge: 2017-03-04 | Disposition: A | Discharge status: Home / Self Care | Payer: No Typology Code available for payment source | Source: Ambulatory Visit | Attending: Cardiovascular Disease | Admitting: Cardiovascular Disease

## 2017-03-04 VITALS — Wt 176.4 lb

## 2017-03-04 DIAGNOSIS — Z955 Presence of coronary angioplasty implant and graft: Secondary | ICD-10-CM

## 2017-03-06 ENCOUNTER — Encounter (HOSPITAL_COMMUNITY): Payer: Self-pay

## 2017-03-06 ENCOUNTER — Encounter (HOSPITAL_COMMUNITY): Payer: No Typology Code available for payment source

## 2017-03-06 ENCOUNTER — Encounter (HOSPITAL_COMMUNITY): Admission: RE | Admit: 2017-03-06 | Payer: Self-pay | Source: Ambulatory Visit

## 2017-03-08 ENCOUNTER — Encounter (HOSPITAL_COMMUNITY)
Admission: RE | Admit: 2017-03-08 | Discharge: 2017-03-08 | Disposition: A | Discharge status: Home / Self Care | Payer: Self-pay | Source: Ambulatory Visit | Attending: Cardiovascular Disease | Admitting: Cardiovascular Disease

## 2017-03-08 DIAGNOSIS — I2510 Atherosclerotic heart disease of native coronary artery without angina pectoris: Secondary | ICD-10-CM | POA: Insufficient documentation

## 2017-03-11 ENCOUNTER — Encounter (HOSPITAL_COMMUNITY): Admission: RE | Admit: 2017-03-11 | Payer: Self-pay | Source: Ambulatory Visit

## 2017-03-11 NOTE — Telephone Encounter (Signed)
I spoke with pt and told him we had not received paperwork from [facility].  He is seeing them later this week and will ask for paperwork to be resent.

## 2017-03-12 ENCOUNTER — Encounter (HOSPITAL_COMMUNITY): Payer: Self-pay

## 2017-03-12 NOTE — Progress Notes (Signed)
Cardiac Individual Treatment Plan  Patient Details  Name: Sean Schultz MRN: [id number] Date of Birth: 03/24/1951 Referring Provider:     [facility] from 11/01/2016 in [place]  Referring Provider  Darlina Guys MD      Initial Encounter Date:    CARDIAC REHAB PHASE II ORIENTATION from 11/01/2016 in [place]  Date  11/01/16  Referring Provider  Darlina Guys MD      Visit Diagnosis: 09/24/16 Status post coronary artery stent placement  Patient's Home Medications on Admission:  Current Outpatient Prescriptions:  .  aspirin EC 81 MG tablet, Take 81 mg by mouth daily. , Disp: , Rfl:  .  azelastine (OPTIVAR) 0.05 % ophthalmic solution, Place 1 drop into both eyes daily. , Disp: , Rfl: 0 .  calcium carbonate (TUMS - DOSED IN MG ELEMENTAL CALCIUM) 500 MG chewable tablet, Chew 1 tablet by mouth 2 (two) times daily., Disp: , Rfl:  .  clopidogrel (PLAVIX) 75 MG tablet, Take 1 tablet (75 mg total) by mouth daily., Disp: 30 tablet, Rfl: 11 .  Coenzyme Q10 (CO Q 10 PO), Take 1 capsule by mouth 2 (two) times daily., Disp: , Rfl:  .  Cyanocobalamin (RA VITAMIN B-12 TR) 1000 MCG TBCR, Take 1,000 mcg by mouth daily. , Disp: , Rfl:  .  fenofibrate 160 MG tablet, Take 160 mg by mouth daily., Disp: , Rfl: 0 .  fluticasone (VERAMYST) 27.5 MCG/SPRAY nasal spray, Place 1 spray into the nose 2 (two) times daily., Disp: , Rfl:  .  folic acid (FOLVITE) 1 MG tablet, Take 1 mg by mouth 2 (two) times daily., Disp: , Rfl:  .  Melatonin 3 MG TABS, Take 3 mg by mouth at bedtime as needed (sleep)., Disp: , Rfl:  .  metoprolol tartrate (LOPRESSOR) 25 MG tablet, Take 1 tablet (25 mg total) by mouth 2 (two) times daily., Disp: 60 tablet, Rfl: 6 .  montelukast (SINGULAIR) 10 MG tablet, Take 10 mg by mouth daily. , Disp: , Rfl: 1 .  NIACIN PO, Take 1 tablet by mouth daily. , Disp: , Rfl:  .  nitroGLYCERIN (NITROSTAT) 0.4  MG SL tablet, Place 1 tablet (0.4 mg total) under the tongue every 5 (five) minutes as needed., Disp: 25 tablet, Rfl: 3 .  Omega-3 Fatty Acids (FISH OIL PO), Take 1 capsule by mouth daily., Disp: , Rfl:  .  Plant Sterol Stanol-Pantethine (CHOLESTOFF COMPLETE) 300-100 MG CAPS, Take 2 capsules by mouth every evening. , Disp: , Rfl:  .  valsartan (DIOVAN) 160 MG tablet, Take 160 mg by mouth daily. , Disp: , Rfl: 1  Past Medical History: Past Medical History:  Diagnosis Date  . BPH (benign prostatic hypertrophy)   . Coronary artery disease   . GERD (gastroesophageal reflux disease)   . H/O seasonal allergies   . Hyperlipemia   . Hypertension   . Prostatitis     Tobacco Use: History  Smoking Status  . Former Smoker  . Packs/day: 0.50  . Years: 12.00  . Types: Cigarettes  . Quit date: 11/17/1978  Smokeless Tobacco  . Never Used    Labs: Recent Review Flowsheet Data    There is no flowsheet data to display.      Capillary Blood Glucose: Lab Results  Component Value Date   GLUCAP 130 (H) 07/14/2016     Exercise Target Goals:    Exercise Program Goal: Individual exercise prescription set with THRR,  safety & activity barriers. Participant demonstrates ability to understand and report RPE using BORG scale, to self-measure pulse accurately, and to acknowledge the importance of the exercise prescription.  Exercise Prescription Goal: Starting with aerobic activity 30 plus minutes a day, 3 days per week for initial exercise prescription. Provide home exercise prescription and guidelines that participant acknowledges understanding prior to discharge.  Activity Barriers & Risk Stratification:     Activity Barriers & Cardiac Risk Stratification - 11/01/16 0850      Activity Barriers & Cardiac Risk Stratification   Activity Barriers Other (comment)   Comments R artificial hip   Cardiac Risk Stratification High      6 Minute Walk:     6 Minute Walk    Row Name 11/01/16  0950 11/01/16 1004 03/15/17 1211     6 Minute Walk   Phase Initial  - (P)  Discharge   Distance 1552 feet  - (P)  2134 feet   Distance % Change  -  - (P)  37.5 %   Walk Time 6 minutes  - (P)  6 minutes   # of Rest Breaks 0  - (P)  0   MPH  - 2.9  -   METS  - 3.3  -   RPE 19  -  -   VO2 Peak  - 11.7  -   Symptoms No  -  -   Resting HR 63 bpm  -  -   Resting BP 122/76  -  -   Max Ex. HR 87 bpm  -  -   Max Ex. BP 132/76  -  -   2 Minute Post BP 124/82  -  -   Row Name 03/25/17 0749         6 Minute Walk   Phase Discharge     Distance 2134 feet     Distance % Change 37.5 %     Walk Time 6 minutes     # of Rest Breaks 0     MPH 4     METS 4.5     RPE 11     VO2 Peak 15.7     Symptoms No     Resting HR 63 bpm     Resting BP 112/60     Max Ex. HR 98 bpm     Max Ex. BP 134/76     2 Minute Post BP 100/62        Oxygen Initial Assessment:   Oxygen Re-Evaluation:   Oxygen Discharge (Final Oxygen Re-Evaluation):   Initial Exercise Prescription:     Initial Exercise Prescription - 11/01/16 1000      Date of Initial Exercise RX and Referring Provider   Date 11/01/16   Referring Provider Darlina Guys MD     Bike   Level 0.8   Minutes 10   METs 2.89     NuStep   Level 2   Minutes 10   METs 2.3     Track   Laps 11   Minutes 10   METs 2.92     Prescription Details   Frequency (times per week) 3   Duration Progress to 45 minutes of aerobic exercise without signs/symptoms of physical distress     Intensity   THRR 40-80% of Max Heartrate 61-122   Ratings of Perceived Exertion 11-13   Perceived Dyspnea 0-4     Progression   Progression Continue to progress workloads to maintain intensity without signs/symptoms  of physical distress.     Resistance Training   Training Prescription Yes   Weight 3   Reps 10-12      Perform Capillary Blood Glucose checks as needed.  Exercise Prescription Changes:      Exercise Prescription Changes    Row  Name 12/03/16 1400 01/07/17 0800 01/28/17 1600 02/25/17 1400 03/25/17 0700     Response to Exercise   Blood Pressure (Admit) 133/84 130/80 116/78 112/60 124/70   Blood Pressure (Exercise) 142/80 138/80 122/70 140/76 124/74   Blood Pressure (Exit) 120/82 122/80 104/70 100/62 108/64   Heart Rate (Admit) 72 bpm 68 bpm 57 bpm 63 bpm 66 bpm   Heart Rate (Exercise) 99 bpm 100 bpm 103 bpm 98 bpm 92 bpm   Heart Rate (Exit) 67 bpm 68 bpm 67 bpm 63 bpm 66 bpm   Rating of Perceived Exertion (Exercise) _0 Duration Progress to 45 minutes of aerobic exercise without signs/symptoms of physical distress Progress to 45 minutes of aerobic exercise without signs/symptoms of physical distress Progress to 45 minutes of aerobic exercise without signs/symptoms of physical distress Progress to 45 minutes of aerobic exercise without signs/symptoms of physical distress Progress to 45 minutes of aerobic exercise without signs/symptoms of physical distress   Intensity _1      Progression   Progression Continue to progress workloads to maintain intensity without signs/symptoms of physical distress. Continue to progress workloads to maintain intensity without signs/symptoms of physical distress. Continue to progress workloads to maintain intensity without signs/symptoms of physical distress. Continue to progress workloads to maintain intensity without signs/symptoms of physical distress. Continue to progress workloads to maintain intensity without signs/symptoms of physical distress.   Average METs 3.8 3.9 4.3 4.3 4.3     Resistance Training   Training Prescription _2    Weight 5lb 5lb 5lb 5lb 5lb   Reps 10-12 10-15 10-15 10-15 10-15     Bike   Level 1.5 1.5 1.5 1.5 1.5   Minutes _3 METs 4.52 4.52 4.57 4.57 4.57     NuStep   Level _4 Minutes _5 METs 2.8 3.1 4.5 4.4 4.1     Track    Laps _6 Minutes _7 METs 3.79 4.14 4.14 3.95 4.3     Home Exercise Plan   Plans to continue exercise at [place] (comment) Home (comment) Home (comment) Home (comment)   Frequency Add 4 additional days to program exercise sessions. Add 4 additional days to program exercise sessions. Add 4 additional days to program exercise sessions. Add 4 additional days to program exercise sessions. Add 4 additional days to program exercise sessions.      Exercise Comments:      Exercise Comments    Row Name 11/14/16 0818 12/10/16 1100 01/07/17 0843 02/04/17 1610 02/27/17 1127   Exercise Comments Pt is off to a great start with exercise Reviewed METs and goals with pt.  Pt is doing well with exercise  Reviewed METs and goals with pt.  Pt is doing well with exercise  Reviewed METs and goals with pt.  Pt continues to do well with exercise.  Reviewed METs and goals with pt.  Pt continues to do well with exercise.    [place] Name 03/25/17 [phone]  Exercise Comments Pt has graduated from the program and he's doing well in our maintenance program.           Exercise Goals and Review:      Exercise Goals    Row Name 01/07/17 0843             Exercise Goals   Increase Physical Activity Yes       Intervention Provide advice, education, support and counseling about physical activity/exercise needs.;Develop an individualized exercise prescription for aerobic and resistive training based on initial evaluation findings, risk stratification, comorbidities and participant's personal goals.       Expected Outcomes Achievement of increased cardiorespiratory fitness and enhanced flexibility, muscular endurance and strength shown through measurements of functional capacity and personal statement of participant.       Increase Strength and Stamina Yes       Intervention Provide advice, education, support and counseling about physical activity/exercise needs.;Develop an  individualized exercise prescription for aerobic and resistive training based on initial evaluation findings, risk stratification, comorbidities and participant's personal goals.       Expected Outcomes Achievement of increased cardiorespiratory fitness and enhanced flexibility, muscular endurance and strength shown through measurements of functional capacity and personal statement of participant.          Exercise Goals Re-Evaluation :     Exercise Goals Re-Evaluation    Row Name 01/09/17 1637 01/09/17 1638 02/04/17 1608 02/27/17 1124 03/25/17 0753     Exercise Goal Re-Evaluation   Exercise Goals Review Increase Strenth and Stamina;Increase Physical Activity  - Increase Strenth and Stamina;Increase Physical Activity Increase Strenth and Stamina;Increase Physical Activity Increase Strenth and Stamina;Increase Physical Activity   Comments Pt is doing well with exercise and states he fells like he has more energy and strength.  He is able to do more without fatigue  - Pt is doing well with exericse.  Although he frequently misses class due to travel for work, he states that he feels more stable and his one-leg balance has improved on each leg by 15-20 seconds.   pt continues to do well with exercise and states that he is walking at home 3 days/week.  He plans to continue walking at home and possibly do our maintenance program once he graduates next Wednesday. Pt has graduated from the program and he is continuing to exercise in our maintenance program   Expected Outcomes  - Increase cardiovascular fitness and nutrition with CRPII, HEP and education classes  Continue with exercise prescription and home exercise program in order to continue to increase cardiorespiratory fitness.  Continue with exercise prescription and home exercise program in order to continue to increase cardiorespiratory fitness.  Continue with exercise prescription and home exercise program in order to continue to increase  cardiorespiratory fitness.        Discharge Exercise Prescription (Final Exercise Prescription Changes):     Exercise Prescription Changes - 03/25/17 0700      Response to Exercise   Blood Pressure (Admit) 124/70   Blood Pressure (Exercise) 124/74   Blood Pressure (Exit) 108/64   Heart Rate (Admit) 66 bpm   Heart Rate (Exercise) 92 bpm   Heart Rate (Exit) 66 bpm   Rating of Perceived Exertion (Exercise) 12   Duration Progress to 45 minutes of aerobic exercise without signs/symptoms of physical distress   Intensity THRR unchanged     Progression   Progression Continue to progress workloads to maintain intensity without signs/symptoms of physical distress.   Average METs  4.3     Resistance Training   Training Prescription Yes   Weight 5lb   Reps 10-15     Bike   Level 1.5   Minutes 10   METs 4.57     NuStep   Level 5   Minutes 10   METs 4.1     Track   Laps 20   Minutes 10   METs 4.3     Home Exercise Plan   Plans to continue exercise at Home (comment)   Frequency Add 4 additional days to program exercise sessions.      Nutrition:  Target Goals: Understanding of nutrition guidelines, daily intake of sodium <1518m, cholesterol <2021m calories 30% from fat and 7% or less from saturated fats, daily to have 5 or more servings of fruits and vegetables.  Biometrics:     Pre Biometrics - 11/01/16 1028      Pre Biometrics   % Body Fat 25.8 %         Post Biometrics - 03/25/17 0755       Post  Biometrics   Weight 176 lb 5.9 oz (80 kg)   Waist Circumference 38.5 inches   Hip Circumference 41.75 inches   Waist to Hip Ratio 0.92 %   Triceps Skinfold 10 mm   % Body Fat 25.4 %   Grip Strength 45.5 kg   Flexibility 12 in   Single Leg Stand 14.6 seconds      Nutrition Therapy Plan and Nutrition Goals:     Nutrition Therapy & Goals - 12/14/16 0809      Nutrition Therapy   Diet Therapeutic Lifestyle Changes     Personal Nutrition Goals    Nutrition Goal Pt wants to lose 6-15 lb while in [place] and counsel regarding individualized specific dietary modifications aiming towards targeted core components such as weight, hypertension, lipid management, diabetes, heart failure and other comorbidities.   Expected Outcomes Short Term Goal: Understand basic principles of dietary content, such as calories, fat, sodium, cholesterol and nutrients.;Long Term Goal: Adherence to prescribed nutrition plan.      Nutrition Discharge: Nutrition Scores:     Nutrition Assessments - 03/26/17 1426      MEDFICTS Scores   Pre Score 13   Post Score 6   Score Difference -7      Nutrition Goals Re-Evaluation:     Nutrition Goals Re-Evaluation    Row Name 03/26/17 1427             Goals   Current Weight 175 lb (79.4 kg)       Nutrition Goal Pt wants to lose 6-15 lb while in Cardiac Rehab       Comment Pt has lost 1.6 lb since admission. Wt loss goal not met.           Nutrition Goals Re-Evaluation:     Nutrition Goals Re-Evaluation    [place] 03/26/17 1427             Goals   Current Weight 175 lb (79.4 kg)       Nutrition Goal Pt wants to lose 6-15 lb while in Cardiac Rehab       Comment Pt has lost 1.6 lb since admission. Wt loss goal not met.           Nutrition Goals Discharge (Final Nutrition Goals Re-Evaluation):     Nutrition Goals Re-Evaluation - 03/26/17 1427  Goals   Current Weight 175 lb (79.4 kg)   Nutrition Goal Pt wants to lose 6-15 lb while in Cardiac Rehab   Comment Pt has lost 1.6 lb since admission. Wt loss goal not met.       Psychosocial: Target Goals: Acknowledge presence or absence of significant depression and/or stress, maximize coping skills, provide positive support system. Participant is able to verbalize types and ability to use techniques and skills needed for reducing stress and depression.  Initial Review &  Psychosocial Screening:     Initial Psych Review & Screening - 11/15/16 1156      Barriers   Psychosocial barriers to participate in program There are no identifiable barriers or psychosocial needs.      Quality of Life Scores:     Quality of Life - 03/25/17 0756      Quality of Life Scores   Health/Function Pre 26.89 %   Health/Function Post 27.32 %   Health/Function % Change 1.6 %   Socioeconomic Pre 27.43 %   Socioeconomic Post 28.93 %   Socioeconomic % Change  5.47 %   Psych/Spiritual Pre 28.29 %   Psych/Spiritual Post 28.93 %   Psych/Spiritual % Change 2.26 %   Family Pre 28.8 %   Family Post 28.5 %   Family % Change -1.04 %   GLOBAL Pre 27.59 %   GLOBAL Post 28.18 %   GLOBAL % Change 2.14 %      PHQ-9: Recent Review Flowsheet Data    Depression screen [facility] 2/9 03/12/2017 11/07/2016   Decreased Interest 0 1   Down, Depressed, Hopeless 0 1   PHQ - 2 Score 0 2     Interpretation of Total Score  Total Score Depression Severity:  1-4 = Minimal depression, 5-9 = Mild depression, 10-14 = Moderate depression, 15-19 = Moderately severe depression, 20-27 = Severe depression   Psychosocial Evaluation and Intervention:     Psychosocial Evaluation - 03/12/17 1636      Psychosocial Evaluation & Interventions   Continue Psychosocial Services  No Follow up required     Discharge Psychosocial Assessment & Intervention   Comments no psychosocial needs identified, no interventions necessary.      Psychosocial Re-Evaluation:     Psychosocial Re-Evaluation    Row Name 11/15/16 1158 12/13/16 1421 01/09/17 1704 01/28/17 1220       Psychosocial Re-Evaluation   Current issues with  -  - None Identified None Identified    Comments Pt off to a good start.  Denies any needs at this time.  Pt off to a good start.  Denies any needs at this time.   - no psychosocial needs identified, no interventions necessary. pt reports dyspnea and fatigue have improved. 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      Expected Outcomes  -  - demostrates positivey and healthy coping skills, humorous outlook on life pt will continue to demonstrate positive, healthy coping skills.      Interventions Encouraged to attend Cardiac Rehabilitation for the exercise Encouraged to attend Cardiac Rehabilitation for the exercise;Stress management education;Relaxation education  - Encouraged to attend Cardiac Rehabilitation for the exercise    Continue Psychosocial Services   - No  - No Follow up required       Psychosocial Discharge (Final Psychosocial Re-Evaluation):     Psychosocial Re-Evaluation - 01/28/17 1220      Psychosocial Re-Evaluation   Current issues with None Identified   Comments no  psychosocial needs identified, no interventions necessary. pt reports dyspnea and fatigue have improved. 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     Expected Outcomes pt will continue to demonstrate positive, healthy coping skills.     Interventions Encouraged to attend Cardiac Rehabilitation for the exercise   Continue Psychosocial Services  No Follow up required      Vocational Rehabilitation: Provide vocational rehab assistance to qualifying candidates.   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      Education: Education Goals: Education classes will be provided on a weekly basis, covering required topics. Participant will state understanding/return demonstration of topics presented.  Learning Barriers/Preferences:   Education Topics: Count Your Pulse:  -Group instruction provided by verbal instruction, demonstration, patient participation and written materials to  support subject.  Instructors address importance of being able to find your pulse and how to count your pulse when at home without a heart monitor.  Patients get hands on experience counting their pulse with staff help and individually.   Heart Attack, Angina, and Risk Factor Modification:  -Group instruction provided by verbal instruction, video, and written materials to support subject.  Instructors address signs and symptoms of angina and heart attacks.    Also discuss risk factors for heart disease and how to make changes to improve heart health risk factors.   CARDIAC REHAB PHASE II EXERCISE from 02/27/2017 in [place]  Date  12/05/16  Instruction Review Code  2- meets goals/outcomes      Functional Fitness:  -Group instruction provided by verbal instruction, demonstration, patient participation, and written materials to support subject.  Instructors address safety measures for doing things around the house.  Discuss how to get up and down off the floor, how to pick things up properly, how to safely get out of a chair without assistance, and balance training.   Meditation and Mindfulness:  -Group instruction provided by verbal instruction, patient participation, and written materials to support subject.  Instructor addresses importance of mindfulness and meditation practice to help reduce stress and improve awareness.  Instructor also leads participants through a meditation exercise.    CARDIAC REHAB PHASE II EXERCISE from 02/27/2017 in [place]  Date  02/27/17  Instruction Review Code  2- meets goals/outcomes      Stretching for Flexibility and Mobility:  -Group instruction provided by verbal instruction, patient participation, and written materials to support subject.  Instructors lead participants through series of stretches that are designed to increase flexibility thus improving mobility.  These stretches are  additional exercise for major muscle groups that are typically performed during regular warm up and cool down.   CARDIAC REHAB PHASE II EXERCISE from 02/27/2017 in [place]  Date  12/07/16  Instruction Review Code  2- meets goals/outcomes      Hands Only CPR Anytime:  -Group instruction provided by verbal instruction, video, patient participation and written materials to support subject.  Instructors co-teach with [facility] video for hands only CPR.  Participants get hands on experience with mannequins.   Nutrition I class: Heart Healthy Eating:  -Group instruction provided by PowerPoint slides, verbal discussion, and  written materials to support subject matter. The instructor gives an explanation and review of the Therapeutic Lifestyle Changes diet recommendations, which includes a discussion on lipid goals, dietary fat, sodium, fiber, plant stanol/sterol esters, sugar, and the components of a well-balanced, healthy diet.   CARDIAC REHAB PHASE II EXERCISE from 02/27/2017 in [place]  Date  12/18/16  Educator  RD  Instruction Review Code  2- meets goals/outcomes      Nutrition II class: Lifestyle Skills:  -Group instruction provided by PowerPoint slides, verbal discussion, and written materials to support subject matter. The instructor gives an explanation and review of label reading, grocery shopping for heart health, heart healthy recipe modifications, and ways to make healthier choices when eating out.   CARDIAC REHAB PHASE II EXERCISE from 02/27/2017 in [place]  Date  12/25/16  Educator  RD  Instruction Review Code  2- meets goals/outcomes      Diabetes Question & Answer:  -Group instruction provided by PowerPoint slides, verbal discussion, and written materials to support subject matter. The instructor gives an explanation and review of diabetes co-morbidities, pre- and post-prandial blood  glucose goals, pre-exercise blood glucose goals, signs, symptoms, and treatment of hypoglycemia and hyperglycemia, and foot care basics.   Diabetes Blitz:  -Group instruction provided by PowerPoint slides, verbal discussion, and written materials to support subject matter. The instructor gives an explanation and review of the physiology behind type 1 and type 2 diabetes, diabetes medications and rational behind using different medications, pre- and post-prandial blood glucose recommendations and Hemoglobin A1c goals, diabetes diet, and exercise including blood glucose guidelines for exercising safely.    Portion Distortion:  -Group instruction provided by PowerPoint slides, verbal discussion, written materials, and food models to support subject matter. The instructor gives an explanation of serving size versus portion size, changes in portions sizes over the last 20 years, and what consists of a serving from each food group.   CARDIAC REHAB PHASE II EXERCISE from 02/27/2017 in [facility]  Date  01/09/17  Educator  RD  Instruction Review Code  2- meets goals/outcomes      Stress Management:  -Group instruction provided by verbal instruction, video, and written materials to support subject matter.  Instructors review role of stress in heart disease and how to cope with stress positively.     Exercising on Your Own:  -Group instruction provided by verbal instruction, power point, and written materials to support subject.  Instructors discuss benefits of exercise, components of exercise, frequency and intensity of exercise, and end points for exercise.  Also discuss use of nitroglycerin and activating EMS.  Review options of places to exercise outside of rehab.  Review guidelines for sex with heart disease.   CARDIAC REHAB PHASE II EXERCISE from 02/27/2017 in [place]  Date  12/12/16  Instruction Review Code  2- meets goals/outcomes       Cardiac Drugs I:  -Group instruction provided by verbal instruction and written materials to support subject.  Instructor reviews cardiac drug classes: antiplatelets, anticoagulants, beta blockers, and statins.  Instructor discusses reasons, side effects, and lifestyle considerations for each drug class.   CARDIAC REHAB PHASE II EXERCISE from 02/27/2017 in [place]  Date  12/26/16  Instruction Review Code  2- meets goals/outcomes      Cardiac Drugs II:  -Group instruction provided by verbal instruction and written materials to support  subject.  Instructor reviews cardiac drug classes: angiotensin converting enzyme inhibitors (ACE-I), angiotensin II receptor blockers (ARBs), nitrates, and calcium channel blockers.  Instructor discusses reasons, side effects, and lifestyle considerations for each drug class.   CARDIAC REHAB PHASE II EXERCISE from 02/27/2017 in [place]  Date  01/23/17  Educator  Kennyth Lose  Instruction Review Code  2- meets goals/outcomes      Anatomy and Physiology of the Circulatory System:  -Group instruction provided by verbal instruction, video, and written materials to support subject.  Reviews functional anatomy of heart, how it relates to various diagnoses, and what role the heart plays in the overall system.   Knowledge Questionnaire Score:     Knowledge Questionnaire Score - 03/15/17 1211      Knowledge Questionnaire Score   Post Score 22/24      Core Components/Risk Factors/Patient Goals at Admission:     Personal Goals and Risk Factors at Admission - 11/01/16 0850      Core Components/Risk Factors/Patient Goals on Admission    Weight Management Yes;Weight Loss   Intervention Weight Management: Develop a combined nutrition and exercise program designed to reach desired caloric intake, while maintaining appropriate intake of nutrient and fiber, sodium and fats, and appropriate energy  expenditure required for the weight goal.;Weight Management: Provide education and appropriate resources to help participant work on and attain dietary goals.;Weight Management/Obesity: Establish reasonable short term and long term weight goals.;Obesity: Provide education and appropriate resources to help participant work on and attain dietary goals.   Expected Outcomes Short Term: Continue to assess and modify interventions until short term weight is achieved;Long Term: Adherence to nutrition and physical activity/exercise program aimed toward attainment of established weight goal;Weight Maintenance: Understanding of the daily nutrition guidelines, which includes 25-35% calories from fat, 7% or less cal from saturated fats, less than 250m cholesterol, less than 1.5gm of sodium, & 5 or more servings of fruits and vegetables daily;Weight Loss: Understanding of general recommendations for a balanced deficit meal plan, which promotes 1-2 lb weight loss per week and includes a negative energy balance of [phone] kcal/d;Understanding recommendations for meals to include 15-35% energy as protein, 25-35% energy from fat, 35-60% energy from carbohydrates, less than 2065mof dietary cholesterol, 20-35 gm of total fiber daily;Understanding of distribution of calorie intake throughout the day with the consumption of 4-5 meals/snacks   Sedentary Yes   Intervention Provide advice, education, support and counseling about physical activity/exercise needs.;Develop an individualized exercise prescription for aerobic and resistive training based on initial evaluation findings, risk stratification, comorbidities and participant's personal goals.   Expected Outcomes Achievement of increased cardiorespiratory fitness and enhanced flexibility, muscular endurance and strength shown through measurements of functional capacity and personal statement of participant.   Increase Strength and Stamina Yes   Intervention Develop an  individualized exercise prescription for aerobic and resistive training based on initial evaluation findings, risk stratification, comorbidities and participant's personal goals.;Provide advice, education, support and counseling about physical activity/exercise needs.   Expected Outcomes Achievement of increased cardiorespiratory fitness and enhanced flexibility, muscular endurance and strength shown through measurements of functional capacity and personal statement of participant.   Improve shortness of breath with ADL's Yes   Intervention Provide education, individualized exercise plan and daily activity instruction to help decrease symptoms of SOB with activities of daily living.   Expected Outcomes Short Term: Achieves a reduction of symptoms when performing activities of daily living.   Hypertension Yes   Intervention Provide education on  lifestyle modifcations including regular physical activity/exercise, weight management, moderate sodium restriction and increased consumption of fresh fruit, vegetables, and low fat dairy, alcohol moderation, and smoking cessation.;Monitor prescription use compliance.   Expected Outcomes Short Term: Continued assessment and intervention until BP is < 140/19m HG in hypertensive participants. < 130/856mHG in hypertensive participants with diabetes, heart failure or chronic kidney disease.;Long Term: Maintenance of blood pressure at goal levels.   Lipids Yes   Expected Outcomes Short Term: Participant states understanding of desired cholesterol values and is compliant with medications prescribed. Participant is following exercise prescription and nutrition guidelines.;Long Term: Cholesterol controlled with medications as prescribed, with individualized exercise RX and with personalized nutrition plan. Value goals: LDL < 7049mHDL > 40 mg.   Personal Goal Other Yes   Personal Goal Learn prevention or progression of CVD and be able to live longer and play with  grandchildren   Intervention Provide cardiac education classes to increase knowledge and cardiac awareness. Provide exercise programming to assist with improving exercise tolerance and activity levels   Expected Outcomes Pt will have increased cardiac awareness and improved exercise tolerance to be able to live a healthier lifestyle and play with grandchildren.      Core Components/Risk Factors/Patient Goals Review:      Goals and Risk Factor Review    Row Name 12/10/16 1100 01/07/17 0844 02/05/17 1408 03/01/17 1225 03/12/17 1634     Core Components/Risk Factors/Patient Goals Review   Personal Goals Review Increase Strength and Stamina;Other Other Other;Weight Management/Obesity;Lipids;Hypertension Other;Weight Management/Obesity;Lipids;Hypertension Other;Weight Management/Obesity;Lipids;Hypertension   Review Pt states he feels like his "old self" prior to his event.  He also states he feels like he has more energy and less fatigue with exertion.  Pt is doing well with exercise and states he fells like he has more energy and strength.  He is able to do more without fatigue pt vital signs stable with exercise.  pt is tolerating CR activities without difficulty. pt is actively participating in exercise and education opportunities to decrease CAD risk factors.  pt is exercising on his own at home and using hotel gyms when traveling.  pt is able to play with his grandchildren. pt vital signs stable with exercise.  pt is tolerating CR activities without difficulty. pt is actively participating in exercise and education opportunities to decrease CAD risk factors. pt is planning to participate in cardiac maintenance and [employer].  pt is striviing for optimal health to enjoy retirement.  pt feels he has adequately met his personal goals which include developing active lifestyle.  pt plans to participate in cardiac maintenance program.     Expected Outcomes Increase cardiovascular fitness and nutrition with CRPII,  HEP and education classes  Increase cardiovascular fitness and nutrition with CRPII, HEP and education classes  Increase cardiovascular fitness and nutrition with CRPII, HEP and education classes  pt will continue to participate in lifestyle modificaiton activiies to decrease overall CAD risk factors.  pt will continue to participate in lifestyle modificaiton activiies to decrease overall CAD risk factors.       Core Components/Risk Factors/Patient Goals at Discharge (Final Review):      Goals and Risk Factor Review - 03/12/17 1634      Core Components/Risk Factors/Patient Goals Review   Personal Goals Review Other;Weight Management/Obesity;Lipids;Hypertension   Review pt feels he has adequately met his personal goals which include developing active lifestyle.  pt plans to participate in cardiac maintenance program.     Expected Outcomes pt will continue  to participate in lifestyle modificaiton activiies to decrease overall CAD risk factors.       ITP Comments:     ITP Comments    Row Name 11/01/16 0840 12/14/16 0918 12/14/16 0919       ITP Comments Dr. Fransico Him, Medical Director t attended Hypertension education class on 12/14/16.  Pt met goals and outcomes Pt attended Hypertension education class on 12/14/16.  Pt met goals and outcomes        Comments: Pt graduated from cardiac rehab program today with completion of 33 exercise sessions in Phase II. Pt maintained good attendance and progressed nicely during his participation in rehab as evidenced by increased MET level.   Medication list reconciled. Repeat  PHQ score-  0.  Pt has made significant lifestyle changes and should be commended for his success. Pt feels he has achieved his goals during cardiac rehab.   Pt plans to continue exercise in cardiac maintenance program.

## 2017-03-13 ENCOUNTER — Encounter (HOSPITAL_COMMUNITY): Payer: Self-pay

## 2017-03-13 DIAGNOSIS — I2510 Atherosclerotic heart disease of native coronary artery without angina pectoris: Secondary | ICD-10-CM | POA: Insufficient documentation

## 2017-03-14 ENCOUNTER — Encounter (HOSPITAL_COMMUNITY)
Admission: RE | Admit: 2017-03-14 | Discharge: 2017-03-14 | Disposition: A | Discharge status: Home / Self Care | Payer: Self-pay | Source: Ambulatory Visit | Attending: Cardiovascular Disease | Admitting: Cardiovascular Disease

## 2017-03-15 ENCOUNTER — Encounter (HOSPITAL_COMMUNITY): Payer: Self-pay

## 2017-03-18 ENCOUNTER — Encounter (HOSPITAL_COMMUNITY)
Admission: RE | Admit: 2017-03-18 | Discharge: 2017-03-18 | Disposition: A | Discharge status: Home / Self Care | Payer: Self-pay | Source: Ambulatory Visit | Attending: Cardiovascular Disease | Admitting: Cardiovascular Disease

## 2017-03-20 ENCOUNTER — Encounter (HOSPITAL_COMMUNITY)
Admission: RE | Admit: 2017-03-20 | Discharge: 2017-03-20 | Disposition: A | Discharge status: Home / Self Care | Payer: Self-pay | Source: Ambulatory Visit | Attending: Cardiovascular Disease | Admitting: Cardiovascular Disease

## 2017-03-22 ENCOUNTER — Encounter (HOSPITAL_COMMUNITY)
Admission: RE | Admit: 2017-03-22 | Discharge: 2017-03-22 | Disposition: A | Discharge status: Home / Self Care | Payer: Self-pay | Source: Ambulatory Visit | Attending: Cardiovascular Disease | Admitting: Cardiovascular Disease

## 2017-03-25 ENCOUNTER — Encounter (HOSPITAL_COMMUNITY)
Admission: RE | Admit: 2017-03-25 | Discharge: 2017-03-25 | Disposition: A | Discharge status: Home / Self Care | Payer: Self-pay | Source: Ambulatory Visit | Attending: Cardiovascular Disease | Admitting: Cardiovascular Disease

## 2017-03-27 ENCOUNTER — Encounter (HOSPITAL_COMMUNITY)
Admission: RE | Admit: 2017-03-27 | Discharge: 2017-03-27 | Disposition: A | Discharge status: Home / Self Care | Payer: Self-pay | Source: Ambulatory Visit | Attending: Cardiovascular Disease | Admitting: Cardiovascular Disease

## 2017-03-29 ENCOUNTER — Encounter (HOSPITAL_COMMUNITY)
Admission: RE | Admit: 2017-03-29 | Discharge: 2017-03-29 | Disposition: A | Discharge status: Home / Self Care | Payer: Self-pay | Source: Ambulatory Visit | Attending: Cardiovascular Disease | Admitting: Cardiovascular Disease

## 2017-04-01 ENCOUNTER — Encounter (HOSPITAL_COMMUNITY)
Admission: RE | Admit: 2017-04-01 | Discharge: 2017-04-01 | Disposition: A | Discharge status: Home / Self Care | Payer: Self-pay | Source: Ambulatory Visit | Attending: Cardiovascular Disease | Admitting: Cardiovascular Disease

## 2017-04-03 ENCOUNTER — Encounter (HOSPITAL_COMMUNITY)
Admission: RE | Admit: 2017-04-03 | Discharge: 2017-04-03 | Disposition: A | Discharge status: Home / Self Care | Payer: Self-pay | Source: Ambulatory Visit | Attending: Cardiovascular Disease | Admitting: Cardiovascular Disease

## 2017-04-05 ENCOUNTER — Encounter (HOSPITAL_COMMUNITY)
Admission: RE | Admit: 2017-04-05 | Discharge: 2017-04-05 | Disposition: A | Discharge status: Home / Self Care | Payer: Self-pay | Source: Ambulatory Visit | Attending: Cardiovascular Disease | Admitting: Cardiovascular Disease

## 2017-04-09 ENCOUNTER — Encounter (HOSPITAL_COMMUNITY)
Admission: RE | Admit: 2017-04-09 | Discharge: 2017-04-09 | Disposition: A | Discharge status: Home / Self Care | Payer: Self-pay | Source: Ambulatory Visit | Attending: Cardiovascular Disease | Admitting: Cardiovascular Disease

## 2017-04-10 ENCOUNTER — Encounter (HOSPITAL_COMMUNITY): Payer: Self-pay

## 2017-04-12 ENCOUNTER — Encounter (HOSPITAL_COMMUNITY)
Admission: RE | Admit: 2017-04-12 | Discharge: 2017-04-12 | Disposition: A | Discharge status: Home / Self Care | Payer: Self-pay | Source: Ambulatory Visit | Attending: Cardiovascular Disease | Admitting: Cardiovascular Disease

## 2017-04-12 ENCOUNTER — Other Ambulatory Visit: Payer: Self-pay | Admitting: Cardiology

## 2017-04-12 DIAGNOSIS — I2510 Atherosclerotic heart disease of native coronary artery without angina pectoris: Secondary | ICD-10-CM | POA: Insufficient documentation

## 2017-04-15 ENCOUNTER — Encounter (HOSPITAL_COMMUNITY)
Admission: RE | Admit: 2017-04-15 | Discharge: 2017-04-15 | Disposition: A | Discharge status: Home / Self Care | Payer: Self-pay | Source: Ambulatory Visit | Attending: Cardiovascular Disease | Admitting: Cardiovascular Disease

## 2017-04-16 NOTE — Progress Notes (Signed)
Chief Complaint  Patient presents with  . Follow-up    CAD     History of Present Illness: 66 yo male with history of CAD, HTN, HLD, GERD who is here today for cardiac follow up. He was evaluated in November 2017 by DR. Donnie Aho for coronary calcification and had a stress test that showed ischemia. I performed a cardiac cath on 09/21/16 which showed a calcified 99% mid RCA stenosis. I brought him back on 09/24/16 for [facility] orbital atherectomy of the RCA followed by placement of a Synergy DES x 1 in the mid RCA. He did well following the procedure. He has been in cardiac rehab.   He is here today for follow up. The patient denies any chest pain, dyspnea, palpitations, lower extremity edema, orthopnea, PND, dizziness, near syncope or syncope.    Primary Care Physician: Titus Dubin., MD  Past Medical History:  Diagnosis Date  . BPH (benign prostatic hypertrophy)   . Coronary artery disease   . GERD (gastroesophageal reflux disease)   . H/O seasonal allergies   . Hyperlipemia   . Hypertension   . Prostatitis     Past Surgical History:  Procedure Laterality Date  . CARDIAC CATHETERIZATION N/A 09/21/2016   Procedure: Left Heart Cath and Coronary Angiography;  Surgeon: [name], MD;  Location: [facility] INVASIVE CV LAB;  Service: Cardiovascular;  Laterality: N/A;  . CARDIAC CATHETERIZATION N/A 09/24/2016   Procedure: Coronary Stent Intervention;  Surgeon: [name], MD;  Location: [facility] INVASIVE CV LAB;  Service: Cardiovascular;  Laterality: N/A;  . CARDIAC CATHETERIZATION N/A 09/24/2016   Procedure: Coronary/Graft Atherectomy;  Surgeon: [name], MD;  Location: [facility] INVASIVE CV LAB;  Service: Cardiovascular;  Laterality: N/A;  . CARDIAC CATHETERIZATION N/A 09/24/2016   Procedure: Temporary Pacemaker;  Surgeon: [name], MD;  Location: [facility] INVASIVE CV LAB;  Service: Cardiovascular;  Laterality: N/A;  . CARDIOVASCULAR STRESS TEST   09/14/2016  . COLONOSCOPY    . CORONARY STENT PLACEMENT    . LAPAROSCOPIC APPENDECTOMY N/A 07/14/2016   Procedure: APPENDECTOMY LAPAROSCOPIC;  Surgeon: Ovidio Kin, MD;  Location: [facility] ORS;  Service: General;  Laterality: N/A;  . TOTAL HIP ARTHROPLASTY    . WISDOM TOOTH EXTRACTION      Current Outpatient Prescriptions  Medication Sig Dispense Refill  . aspirin EC 81 MG tablet Take 81 mg by mouth daily.     . calcium carbonate (TUMS - DOSED IN MG ELEMENTAL CALCIUM) 500 MG chewable tablet Chew 1 tablet by mouth 2 (two) times daily.    . clopidogrel (PLAVIX) 75 MG tablet Take 1 tablet (75 mg total) by mouth daily. 30 tablet 11  . Coenzyme Q10 (CO Q 10 PO) Take 1 capsule by mouth 2 (two) times daily.    . Cyanocobalamin (RA VITAMIN B-12 TR) 1000 MCG TBCR Take 1,000 mcg by mouth daily.     . fenofibrate 160 MG tablet Take 160 mg by mouth daily.  0  . fluticasone (VERAMYST) 27.5 MCG/SPRAY nasal spray Place 1 spray into the nose 2 (two) times daily.    . folic acid (FOLVITE) 1 MG tablet Take 1 mg by mouth 2 (two) times daily.    . Melatonin 3 MG TABS Take 3 mg by mouth at bedtime as needed (sleep).    . metoprolol tartrate (LOPRESSOR) 25 MG tablet take 1 tablet by mouth twice a day 60 tablet 5  . montelukast (SINGULAIR) 10 MG tablet Take 10 mg by mouth daily.  1  . NIACIN PO Take 1 tablet by mouth daily.     . nitroGLYCERIN (NITROSTAT) 0.4 MG SL tablet Place 1 tablet (0.4 mg total) under the tongue every 5 (five) minutes as needed. 25 tablet 3  . Omega-3 Fatty Acids (FISH OIL PO) Take 1 capsule by mouth daily.    [name]. Plant Sterol Stanol-Pantethine (CHOLESTOFF COMPLETE) 300-100 MG CAPS Take 2 capsules by mouth every evening.     . rosuvastatin (CRESTOR) 5 MG tablet 5 mg. Take one tablet by mouth twice weekly    . valsartan (DIOVAN) 160 MG tablet Take 160 mg by mouth daily.   1  . pantoprazole (PROTONIX) 40 MG tablet Take 1 tablet (40 mg total) by mouth daily. 30 tablet 11   No current  facility-administered medications for this visit.     Allergies  Allergen Reactions  . Penicillins Swelling and Other (See Comments)    Dyspnea-childhood allergy Has patient had a PCN reaction causing immediate rash, facial/tongue/throat swelling, SOB or lightheadedness with hypotension:unsure Has patient had a PCN reaction causing severe rash involving mucus membranes or skin necrosis:unsure Has patient had a PCN reaction that required hospitalization:unsure Has patient had a PCN reaction occurring within the last 10 years:No If all of the above answers are "NO", then may proceed with Cephalosporin use.    . Rosuvastatin     Other reaction(s): Other (See Comments) Elevated LFT    Social History   Social History  . Marital status: Married    Spouse name: N/A  . Number of children: 2  . Years of education: N/A   Occupational History  . sales    Social History Main Topics  . Smoking status: Former Smoker    Packs/day: 0.50    Years: 12.00    Types: Cigarettes    Quit date: 11/17/1978  . Smokeless tobacco: Never Used  . Alcohol use 0.6 oz/week    1 Glasses of wine per week  . Drug use: No  . Sexual activity: Not on file   Other Topics Concern  . Not on file   Social History Narrative  . No narrative on file    Family History  Problem Relation Age of Onset  . Coronary artery disease Mother [id number]  . CVA Mother   . Heart attack Father [id number]  . Microcephaly Paternal Uncle   . Heart attack Paternal Uncle     Review of Systems:  As stated in the HPI and otherwise negative.   BP 124/70   Pulse (!) 51   Ht 5' 6.5" (1.689 m)   Wt 174 lb 12.8 oz (79.3 kg)   SpO2 99%   BMI 27.79 kg/m   Physical Examination:  General: Well developed, well nourished, NAD  HEENT: OP clear, mucus membranes moist  SKIN: warm, dry. No rashes. Neuro: No focal deficits  Musculoskeletal: Muscle strength 5/5 all ext  Psychiatric: Mood and affect normal  Neck: No JVD, no carotid bruits, no  thyromegaly, no lymphadenopathy.  Lungs:Clear bilaterally, no wheezes, rhonci, crackles Cardiovascular: Regular rate and rhythm. No murmurs, gallops or rubs. Abdomen:Soft. Bowel sounds present. Non-tender.  Extremities: No lower extremity edema. Pulses are 2 + in the bilateral DP/PT.  EKG:  EKG is not ordered today. The ekg ordered today demonstrates   Recent Labs: 07/14/2016: ALT 29 09/25/2016: BUN 14; Creatinine, Ser 1.04; Hemoglobin 12.5; Platelets 183; Potassium 4.6; Sodium 140   Lipid Panel No results found for: CHOL, TRIG, HDL, CHOLHDL, VLDL, LDLCALC, LDLDIRECT  Wt Readings from Last 3 Encounters:  04/17/17 174 lb 12.8 oz (79.3 kg)  03/25/17 176 lb 5.9 oz (80 kg)  11/01/16 177 lb 0.5 oz (80.3 kg)     Other studies Reviewed: Additional studies/ records that were reviewed today include: . Review of the above records demonstrates:   Assessment and Plan:   1. CAD without angina: He has no chest pain suggestive of angina. He had stenting of the heavily calcified mid RCA in November 2017 following atherectomy. Continue ASA, Plavix, beta blocker and ARB. He has not tolerated statins due to elevated LFTs in the past but is now on Crestor 5 mg twice weekly. Will arrange echo to assess LVEF.   2. GERD: Will start Protonix  Current medicines are reviewed at length with the patient today.  The patient does not have concerns regarding medicines.  The following changes have been made:  no change  Labs/ tests ordered today include:   Orders Placed This Encounter  Procedures  . ECHOCARDIOGRAM COMPLETE   Disposition:   FU with me in 5-6 months  Signed, Verne Carrow, MD 04/17/2017 9:50 AM    [facility] [address] [place], [place], [place]  [address] Phone: [phone]; Fax: [phone]

## 2017-04-17 ENCOUNTER — Ambulatory Visit (INDEPENDENT_AMBULATORY_CARE_PROVIDER_SITE_OTHER): Payer: No Typology Code available for payment source | Admitting: Cardiovascular Disease

## 2017-04-17 ENCOUNTER — Encounter: Payer: Self-pay | Admitting: Cardiovascular Disease

## 2017-04-17 ENCOUNTER — Encounter (HOSPITAL_COMMUNITY): Payer: Self-pay

## 2017-04-17 VITALS — BP 124/70 | HR 51 | Ht 66.5 in | Wt 174.8 lb

## 2017-04-17 DIAGNOSIS — K219 Gastro-esophageal reflux disease without esophagitis: Secondary | ICD-10-CM

## 2017-04-17 DIAGNOSIS — I2510 Atherosclerotic heart disease of native coronary artery without angina pectoris: Secondary | ICD-10-CM

## 2017-04-17 MED ORDER — PANTOPRAZOLE SODIUM 40 MG PO TBEC: 40.0000 mg | DELAYED_RELEASE_TABLET | Freq: Every day | ORAL | 11 refills | Status: DC

## 2017-04-17 NOTE — Telephone Encounter (Signed)
Pt saw Dr. Clifton JamesMcAlhany today and nothing else is needed for this at this time.

## 2017-04-17 NOTE — Patient Instructions (Addendum)
Medication Instructions:  Your physician has recommended you make the following change in your medication:  Start protonix 40 mg by mouth daily.    Labwork: none  Testing/Procedures: Your physician has requested that you have an echocardiogram. Echocardiography is a painless test that uses sound waves to create images of your heart. It provides your doctor with information about the size and shape of your heart and how well your heart's chambers and valves are working. This procedure takes approximately one hour. There are no restrictions for this procedure.    Follow-Up: Your physician recommends that you schedule a follow-up appointment in: October or November.  Please call us in late July to schedule this appointment.     Any Other Special Instructions Will Be Listed Below (If Applicable).     If you need a refill on your cardiac medications before your next appointment, please call your pharmacy.

## 2017-04-19 ENCOUNTER — Encounter (HOSPITAL_COMMUNITY)
Admission: RE | Admit: 2017-04-19 | Discharge: 2017-04-19 | Disposition: A | Discharge status: Home / Self Care | Payer: Self-pay | Source: Ambulatory Visit | Attending: Cardiovascular Disease | Admitting: Cardiovascular Disease

## 2017-04-22 ENCOUNTER — Encounter (HOSPITAL_COMMUNITY)
Admission: RE | Admit: 2017-04-22 | Discharge: 2017-04-22 | Disposition: A | Discharge status: Home / Self Care | Payer: Self-pay | Source: Ambulatory Visit | Attending: Cardiovascular Disease | Admitting: Cardiovascular Disease

## 2017-04-24 ENCOUNTER — Encounter (HOSPITAL_COMMUNITY): Payer: Self-pay

## 2017-04-26 ENCOUNTER — Encounter (HOSPITAL_COMMUNITY)
Admission: RE | Admit: 2017-04-26 | Discharge: 2017-04-26 | Disposition: A | Discharge status: Home / Self Care | Payer: Self-pay | Source: Ambulatory Visit | Attending: Cardiovascular Disease | Admitting: Cardiovascular Disease

## 2017-04-29 ENCOUNTER — Encounter (HOSPITAL_COMMUNITY): Admission: RE | Admit: 2017-04-29 | Payer: Self-pay | Source: Ambulatory Visit

## 2017-05-01 ENCOUNTER — Encounter (HOSPITAL_COMMUNITY): Payer: Self-pay

## 2017-05-03 ENCOUNTER — Encounter (HOSPITAL_COMMUNITY): Payer: Self-pay

## 2017-05-06 ENCOUNTER — Encounter (HOSPITAL_COMMUNITY): Payer: Self-pay

## 2017-05-08 ENCOUNTER — Encounter (HOSPITAL_COMMUNITY): Payer: Self-pay

## 2017-05-10 ENCOUNTER — Encounter (HOSPITAL_COMMUNITY): Payer: Self-pay

## 2017-05-13 ENCOUNTER — Encounter (HOSPITAL_COMMUNITY): Payer: Self-pay

## 2017-05-13 DIAGNOSIS — I2510 Atherosclerotic heart disease of native coronary artery without angina pectoris: Secondary | ICD-10-CM | POA: Insufficient documentation

## 2017-05-17 ENCOUNTER — Encounter (HOSPITAL_COMMUNITY): Payer: Self-pay

## 2017-05-20 ENCOUNTER — Ambulatory Visit (HOSPITAL_COMMUNITY): Payer: 59 | Attending: Cardiovascular Disease

## 2017-05-20 ENCOUNTER — Other Ambulatory Visit: Payer: Self-pay

## 2017-05-20 ENCOUNTER — Encounter (HOSPITAL_COMMUNITY)
Admission: RE | Admit: 2017-05-20 | Discharge: 2017-05-20 | Disposition: A | Discharge status: Home / Self Care | Payer: Self-pay | Source: Ambulatory Visit | Attending: Cardiovascular Disease | Admitting: Cardiovascular Disease

## 2017-05-20 DIAGNOSIS — I2510 Atherosclerotic heart disease of native coronary artery without angina pectoris: Secondary | ICD-10-CM

## 2017-05-20 LAB — ECHOCARDIOGRAM COMPLETE
AOASC: 41 cm
Area-P 1/2: 3.1 cm2
CHL CUP DOP CALC LVOT VTI: 22.3 cm
CHL CUP MV DEC (S): 243
CHL CUP REG VEL DIAS: 76.4 cm/s
E/e' ratio: 6.44
EWDT: 243 ms
FS: 38 % (ref 28–44)
IVS/LV PW RATIO, ED: 0.89
LA vol: 48.1 mL
LADIAMINDEX: 1.95 cm/m2
LASIZE: 37 mm
LAVOLA4C: 40.3 mL
LAVOLIN: 25.3 mL/m2
LDCA: 4.91 cm2
LEFT ATRIUM END SYS DIAM: 37 mm
LV E/e' medial: 6.44
LV E/e'average: 6.44
LVELAT: 11.3 cm/s
LVOT SV: 109 mL
LVOT peak vel: 101 cm/s
LVOTD: 25 mm
Lateral S' vel: 12.5 cm/s
MV Peak grad: 2 mmHg
MV pk A vel: 53.3 m/s
MVPKEVEL: 72.8 m/s
MVSPHT: 71 ms
PW: 9 mm — AB (ref 0.6–1.1)
TAPSE: 25.8 mm
TDI e' lateral: 11.3
TDI e' medial: 9.36

## 2017-05-22 ENCOUNTER — Encounter (HOSPITAL_COMMUNITY): Payer: Self-pay

## 2017-05-24 ENCOUNTER — Encounter (HOSPITAL_COMMUNITY)
Admission: RE | Admit: 2017-05-24 | Discharge: 2017-05-24 | Disposition: A | Discharge status: Home / Self Care | Payer: Self-pay | Source: Ambulatory Visit | Attending: Cardiovascular Disease | Admitting: Cardiovascular Disease

## 2017-05-27 ENCOUNTER — Telehealth: Payer: Self-pay | Admitting: Cardiovascular Disease

## 2017-05-27 ENCOUNTER — Encounter (HOSPITAL_COMMUNITY)
Admission: RE | Admit: 2017-05-27 | Discharge: 2017-05-27 | Disposition: A | Discharge status: Home / Self Care | Payer: Self-pay | Source: Ambulatory Visit | Attending: Cardiovascular Disease | Admitting: Cardiovascular Disease

## 2017-05-27 NOTE — Telephone Encounter (Signed)
New Message  Pt states he is calling back to get echo results. Please call back to discuss

## 2017-05-27 NOTE — Telephone Encounter (Signed)
Patient is aware of results and verbalized understanding.

## 2017-05-29 ENCOUNTER — Encounter (HOSPITAL_COMMUNITY)
Admission: RE | Admit: 2017-05-29 | Discharge: 2017-05-29 | Disposition: A | Discharge status: Home / Self Care | Payer: Self-pay | Source: Ambulatory Visit | Attending: Cardiovascular Disease | Admitting: Cardiovascular Disease

## 2017-05-31 ENCOUNTER — Encounter (HOSPITAL_COMMUNITY)
Admission: RE | Admit: 2017-05-31 | Discharge: 2017-05-31 | Disposition: A | Discharge status: Home / Self Care | Payer: Self-pay | Source: Ambulatory Visit | Attending: Cardiovascular Disease | Admitting: Cardiovascular Disease

## 2017-06-03 ENCOUNTER — Encounter (HOSPITAL_COMMUNITY)
Admission: RE | Admit: 2017-06-03 | Discharge: 2017-06-03 | Disposition: A | Discharge status: Home / Self Care | Payer: Self-pay | Source: Ambulatory Visit | Attending: Cardiovascular Disease | Admitting: Cardiovascular Disease

## 2017-06-05 ENCOUNTER — Encounter (HOSPITAL_COMMUNITY)
Admission: RE | Admit: 2017-06-05 | Discharge: 2017-06-05 | Disposition: A | Discharge status: Home / Self Care | Payer: Self-pay | Source: Ambulatory Visit | Attending: Cardiovascular Disease | Admitting: Cardiovascular Disease

## 2017-06-07 ENCOUNTER — Encounter (HOSPITAL_COMMUNITY)
Admission: RE | Admit: 2017-06-07 | Discharge: 2017-06-07 | Disposition: A | Discharge status: Home / Self Care | Payer: Self-pay | Source: Ambulatory Visit | Attending: Cardiovascular Disease | Admitting: Cardiovascular Disease

## 2017-06-10 ENCOUNTER — Other Ambulatory Visit: Payer: Self-pay | Admitting: Cardiovascular Disease

## 2017-06-10 ENCOUNTER — Encounter (HOSPITAL_COMMUNITY): Payer: Self-pay

## 2017-06-10 DIAGNOSIS — K219 Gastro-esophageal reflux disease without esophagitis: Secondary | ICD-10-CM

## 2017-06-10 MED ORDER — METOPROLOL TARTRATE 25 MG PO TABS: 25.0000 mg | ORAL_TABLET | Freq: Two times a day (BID) | ORAL | 0 refills | Status: DC

## 2017-06-10 MED ORDER — CLOPIDOGREL BISULFATE 75 MG PO TABS: 75.0000 mg | ORAL_TABLET | Freq: Every day | ORAL | 0 refills | Status: DC

## 2017-06-10 MED ORDER — PANTOPRAZOLE SODIUM 40 MG PO TBEC: 40.0000 mg | DELAYED_RELEASE_TABLET | Freq: Every day | ORAL | 0 refills | Status: DC

## 2017-06-10 NOTE — Telephone Encounter (Signed)
[place] pt forgot his medication at home. I advised the pt that I would send a 10 day supply to his pharmacy as requested. Confirmation received.

## 2017-06-11 ENCOUNTER — Telehealth: Payer: Self-pay | Admitting: Pharmacist

## 2017-06-11 MED ORDER — IRBESARTAN 150 MG PO TABS: 150.0000 mg | ORAL_TABLET | Freq: Every day | ORAL | 0 refills | Status: DC

## 2017-06-11 MED ORDER — IRBESARTAN 150 MG PO TABS: 150.0000 mg | ORAL_TABLET | Freq: Every day | ORAL | 3 refills | Status: DC

## 2017-06-11 NOTE — Telephone Encounter (Signed)
Pt called stating he was affected to valsartan recall. Will switch valsartan 160mg  to irbesartan 150mg  daily[place] [facility]. Pt aware to monitor his BP over the next few weeks.

## 2017-06-14 ENCOUNTER — Encounter (HOSPITAL_COMMUNITY): Payer: Self-pay

## 2017-06-14 DIAGNOSIS — Z955 Presence of coronary angioplasty implant and graft: Secondary | ICD-10-CM | POA: Insufficient documentation

## 2017-06-17 ENCOUNTER — Encounter (HOSPITAL_COMMUNITY)
Admission: RE | Admit: 2017-06-17 | Discharge: 2017-06-17 | Disposition: A | Discharge status: Home / Self Care | Payer: Self-pay | Source: Ambulatory Visit | Attending: Cardiovascular Disease | Admitting: Cardiovascular Disease

## 2017-06-19 ENCOUNTER — Encounter (HOSPITAL_COMMUNITY)
Admission: RE | Admit: 2017-06-19 | Discharge: 2017-06-19 | Disposition: A | Discharge status: Home / Self Care | Payer: Self-pay | Source: Ambulatory Visit | Attending: Cardiovascular Disease | Admitting: Cardiovascular Disease

## 2017-06-21 ENCOUNTER — Encounter (HOSPITAL_COMMUNITY)
Admission: RE | Admit: 2017-06-21 | Discharge: 2017-06-21 | Disposition: A | Discharge status: Home / Self Care | Payer: Self-pay | Source: Ambulatory Visit | Attending: Cardiovascular Disease | Admitting: Cardiovascular Disease

## 2017-06-21 IMAGING — CT CT ABD-PELV W/ CM
2 of 5 series · 16 of 46 positions shown, 18 images · IV contrast (iopamidol)
Comparison: Pelvic radiograph performed 08/14/2011

CLINICAL DATA: Acute onset of worsening right lower quadrant
abdominal pain, fever and constipation. Initial encounter.

EXAM:
CT ABDOMEN AND PELVIS WITH CONTRAST
TECHNIQUE: Multidetector CT imaging of the abdomen and pelvis was performed
using the standard protocol following bolus administration of
intravenous contrast.
CONTRAST:  100mL MWPU9Z-ERR IOPAMIDOL (MWPU9Z-ERR) INJECTION 61%

[Series 2: abd/pel with · axial · 0.76mm/px · z∈[-456,-42]mm · 13 of 97 slices shown, 15 images]
[im 7/97  soft-tissue]
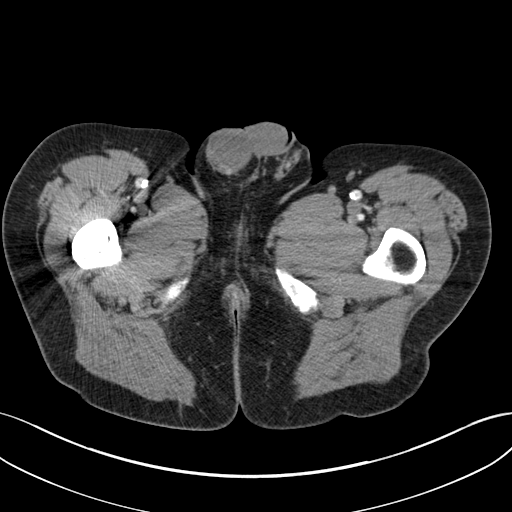
[im 7/97  bone]
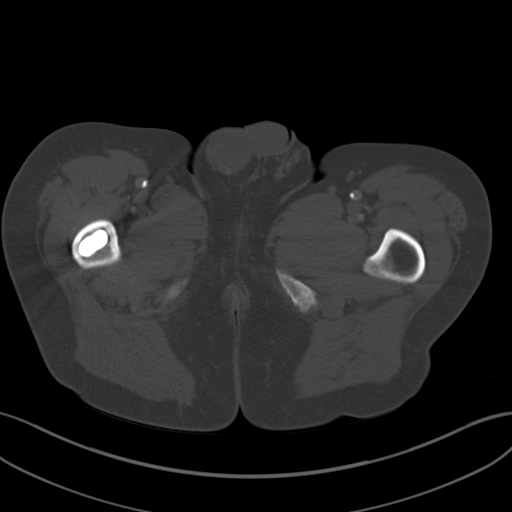
[im 13/97  soft-tissue]
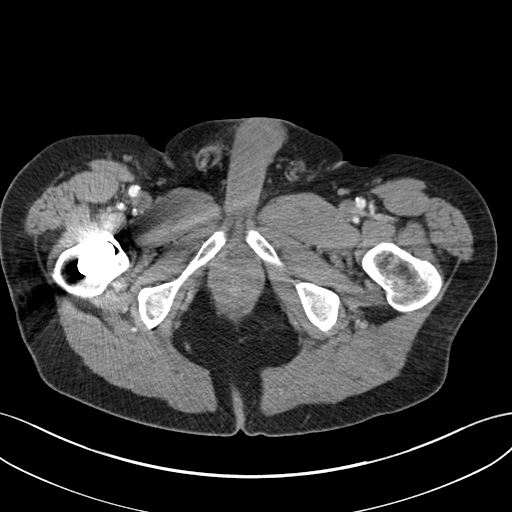
[im 20/97  soft-tissue]
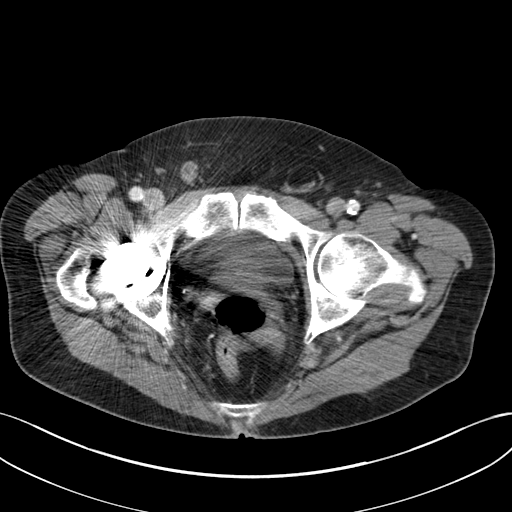
[im 26/97  soft-tissue]
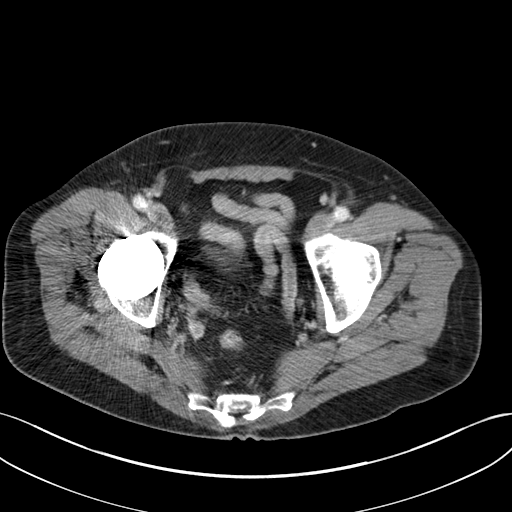
[im 33/97  soft-tissue]
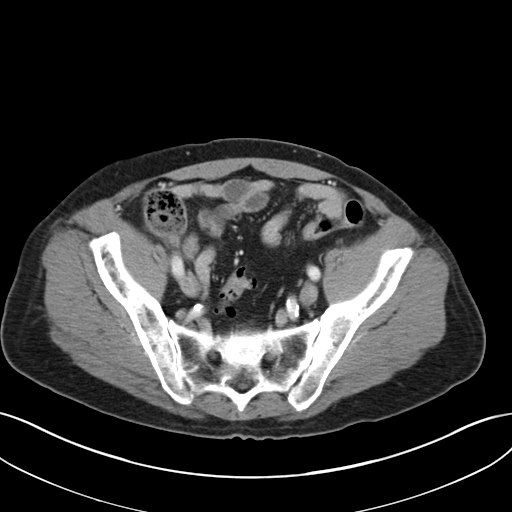
[im 39/97  soft-tissue]
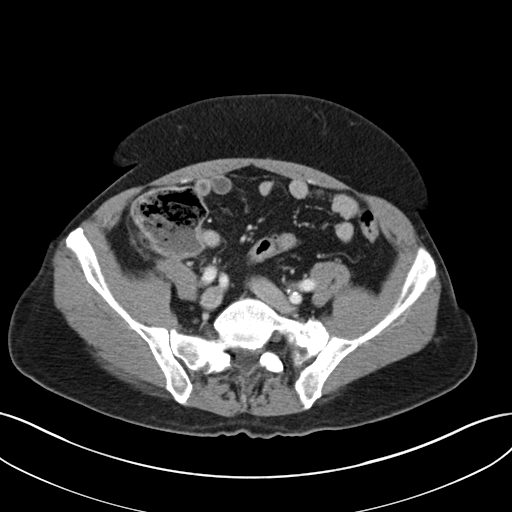
[im 52/97  soft-tissue]
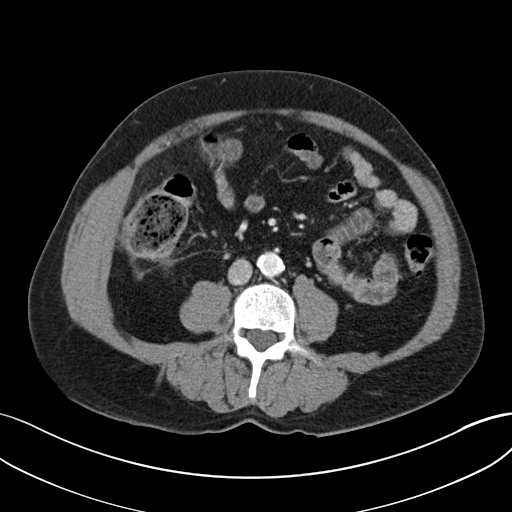
[im 58/97  soft-tissue]
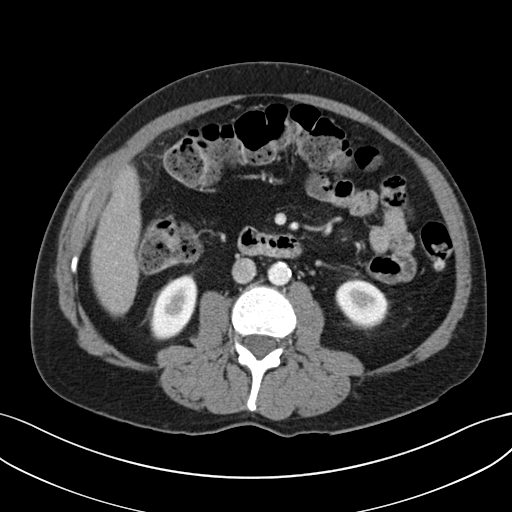
[im 65/97  soft-tissue]
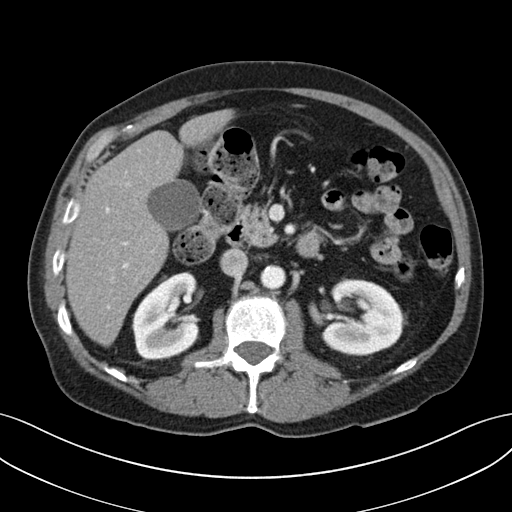
[im 65/97  bone]
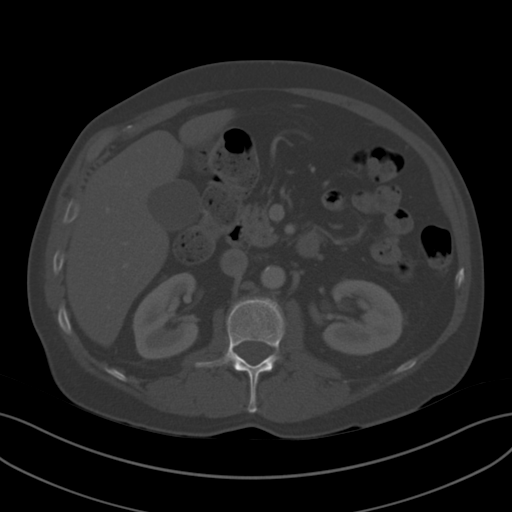
[im 71/97  soft-tissue]
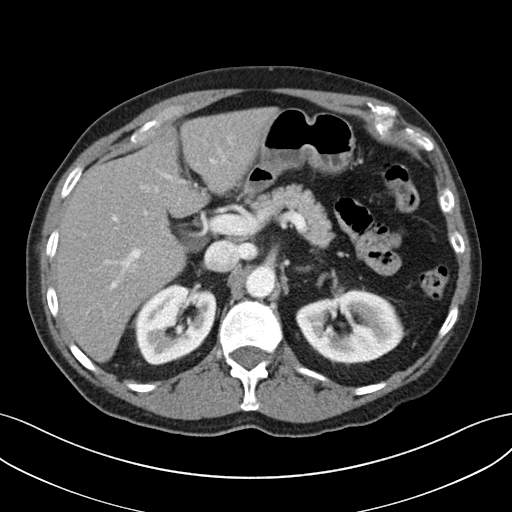
[im 77/97  soft-tissue]
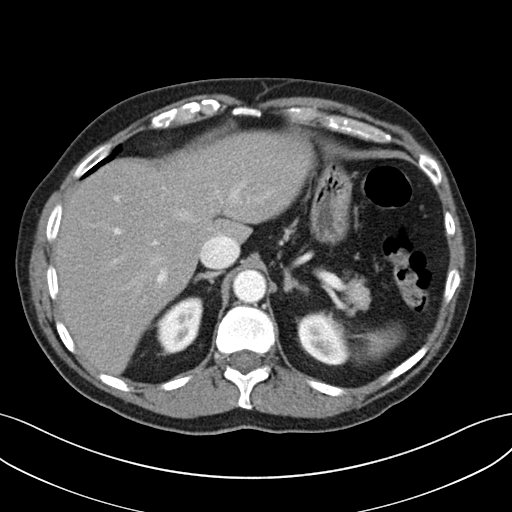
[im 84/97  soft-tissue]
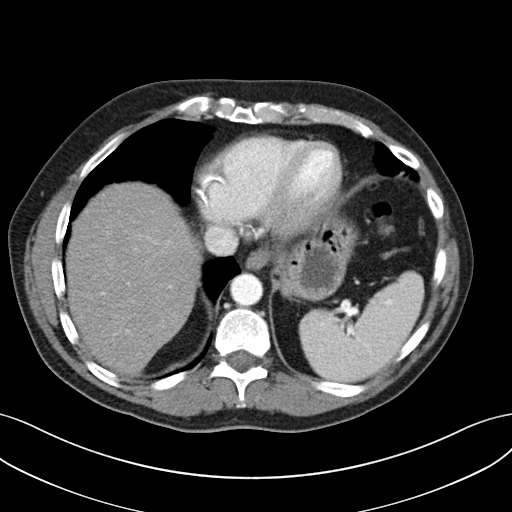
[im 90/97  soft-tissue]
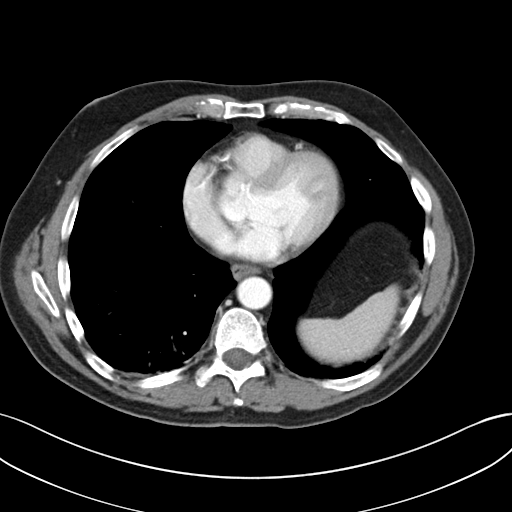

[Series 5: coronal a/|p · coronal · 0.74mm/px · 3 of 140 slices shown]
[im 47/140  soft-tissue]
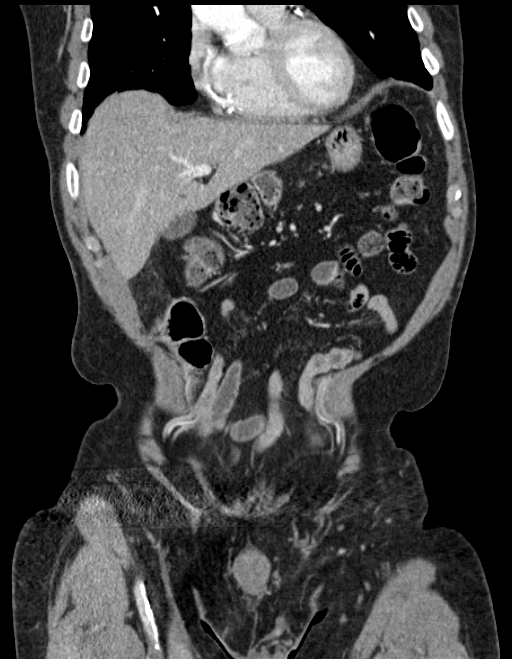
[im 62/140  soft-tissue]
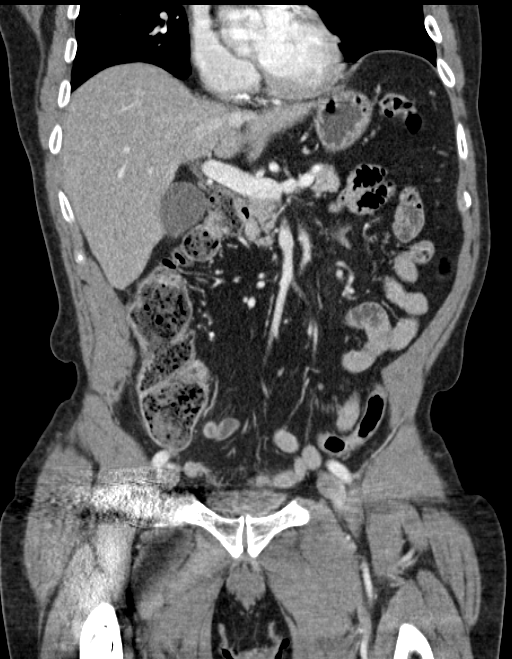
[im 78/140  soft-tissue]
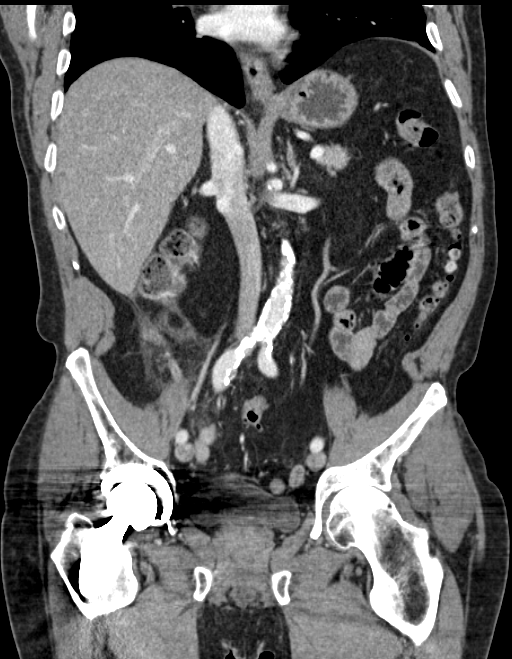

[16 of 46 positions shown; findings below may reference images not displayed]

FINDINGS: Minimal bibasilar atelectasis is noted. Diffuse coronary artery
calcifications are seen.

The liver and spleen are unremarkable in appearance. The gallbladder
is within normal limits. The pancreas and adrenal glands are
unremarkable.

The kidneys are unremarkable in appearance. There is no evidence of
hydronephrosis. No renal or ureteral stones are seen. Mild
nonspecific perinephric stranding is noted bilaterally.

The small bowel is unremarkable in appearance. The stomach is within
normal limits. No acute vascular abnormalities are seen.

The appendix is dilated to 1.0 cm in diameter, with mild wall
enhancement and surrounding soft inflammation, compatible with mild
acute appendicitis. Trace associated free fluid is noted.

Mild diverticulosis is noted along the descending and sigmoid colon,
without evidence of diverticulitis.

The bladder is mildly distended and grossly unremarkable. The
prostate remains normal in size. No inguinal lymphadenopathy is
seen.

No acute osseous abnormalities are identified. A right hip
arthroplasty is grossly unremarkable in appearance, though
incompletely imaged.
IMPRESSION: 1. Acute appendicitis, with dilatation of the appendix to 1.0 cm in
diameter, mild wall enhancement and surrounding soft tissue
inflammation. Trace associated free fluid noted. No evidence of
perforation or abscess formation at this time.
2. Diffuse coronary artery calcifications seen.
3. Mild diverticulosis along the descending and sigmoid colon,
without evidence of diverticulitis.

These results were called by telephone at the time of interpretation
on 07/14/2016 at [DATE] to Dr. BIDIAS VERANSO , who verbally
acknowledged these results.

## 2017-06-24 ENCOUNTER — Encounter (HOSPITAL_COMMUNITY)
Admission: RE | Admit: 2017-06-24 | Discharge: 2017-06-24 | Disposition: A | Discharge status: Home / Self Care | Payer: Self-pay | Source: Ambulatory Visit | Attending: Cardiovascular Disease | Admitting: Cardiovascular Disease

## 2017-06-26 ENCOUNTER — Encounter (HOSPITAL_COMMUNITY): Payer: Self-pay

## 2017-06-28 ENCOUNTER — Encounter (HOSPITAL_COMMUNITY)
Admission: RE | Admit: 2017-06-28 | Discharge: 2017-06-28 | Disposition: A | Discharge status: Home / Self Care | Payer: Self-pay | Source: Ambulatory Visit | Attending: Cardiovascular Disease | Admitting: Cardiovascular Disease

## 2017-07-01 ENCOUNTER — Encounter (HOSPITAL_COMMUNITY)
Admission: RE | Admit: 2017-07-01 | Discharge: 2017-07-01 | Disposition: A | Discharge status: Home / Self Care | Payer: Self-pay | Source: Ambulatory Visit | Attending: Cardiovascular Disease | Admitting: Cardiovascular Disease

## 2017-07-03 ENCOUNTER — Encounter (HOSPITAL_COMMUNITY): Payer: Self-pay

## 2017-07-05 ENCOUNTER — Encounter (HOSPITAL_COMMUNITY)
Admission: RE | Admit: 2017-07-05 | Discharge: 2017-07-05 | Disposition: A | Discharge status: Home / Self Care | Payer: Self-pay | Source: Ambulatory Visit | Attending: Cardiovascular Disease | Admitting: Cardiovascular Disease

## 2017-07-08 ENCOUNTER — Encounter (HOSPITAL_COMMUNITY)
Admission: RE | Admit: 2017-07-08 | Discharge: 2017-07-08 | Disposition: A | Discharge status: Home / Self Care | Payer: Self-pay | Source: Ambulatory Visit | Attending: Cardiovascular Disease | Admitting: Cardiovascular Disease

## 2017-07-10 ENCOUNTER — Encounter (HOSPITAL_COMMUNITY): Payer: Self-pay

## 2017-07-12 ENCOUNTER — Encounter (HOSPITAL_COMMUNITY)
Admission: RE | Admit: 2017-07-12 | Discharge: 2017-07-12 | Disposition: A | Discharge status: Home / Self Care | Payer: Self-pay | Source: Ambulatory Visit | Attending: Cardiovascular Disease | Admitting: Cardiovascular Disease

## 2017-07-17 ENCOUNTER — Encounter (HOSPITAL_COMMUNITY)
Admission: RE | Admit: 2017-07-17 | Discharge: 2017-07-17 | Disposition: A | Discharge status: Home / Self Care | Payer: Self-pay | Source: Ambulatory Visit | Attending: Cardiovascular Disease | Admitting: Cardiovascular Disease

## 2017-07-17 DIAGNOSIS — Z955 Presence of coronary angioplasty implant and graft: Secondary | ICD-10-CM | POA: Insufficient documentation

## 2017-07-19 ENCOUNTER — Encounter (HOSPITAL_COMMUNITY)
Admission: RE | Admit: 2017-07-19 | Discharge: 2017-07-19 | Disposition: A | Discharge status: Home / Self Care | Payer: Self-pay | Source: Ambulatory Visit | Attending: Cardiovascular Disease | Admitting: Cardiovascular Disease

## 2017-07-22 ENCOUNTER — Encounter (HOSPITAL_COMMUNITY): Payer: Self-pay

## 2017-07-24 ENCOUNTER — Encounter (HOSPITAL_COMMUNITY)
Admission: RE | Admit: 2017-07-24 | Discharge: 2017-07-24 | Disposition: A | Discharge status: Home / Self Care | Payer: Self-pay | Source: Ambulatory Visit | Attending: Cardiovascular Disease | Admitting: Cardiovascular Disease

## 2017-07-26 ENCOUNTER — Encounter (HOSPITAL_COMMUNITY)
Admission: RE | Admit: 2017-07-26 | Discharge: 2017-07-26 | Disposition: A | Discharge status: Home / Self Care | Payer: Self-pay | Source: Ambulatory Visit | Attending: Cardiovascular Disease | Admitting: Cardiovascular Disease

## 2017-07-29 ENCOUNTER — Encounter (HOSPITAL_COMMUNITY)
Admission: RE | Admit: 2017-07-29 | Discharge: 2017-07-29 | Disposition: A | Discharge status: Home / Self Care | Payer: Self-pay | Source: Ambulatory Visit | Attending: Cardiovascular Disease | Admitting: Cardiovascular Disease

## 2017-07-31 ENCOUNTER — Encounter (HOSPITAL_COMMUNITY): Payer: Self-pay

## 2017-08-02 ENCOUNTER — Encounter (HOSPITAL_COMMUNITY)
Admission: RE | Admit: 2017-08-02 | Discharge: 2017-08-02 | Disposition: A | Discharge status: Home / Self Care | Payer: Self-pay | Source: Ambulatory Visit | Attending: Cardiovascular Disease | Admitting: Cardiovascular Disease

## 2017-08-05 ENCOUNTER — Encounter (HOSPITAL_COMMUNITY)
Admission: RE | Admit: 2017-08-05 | Discharge: 2017-08-05 | Disposition: A | Discharge status: Home / Self Care | Payer: Self-pay | Source: Ambulatory Visit | Attending: Cardiovascular Disease | Admitting: Cardiovascular Disease

## 2017-08-07 ENCOUNTER — Encounter (HOSPITAL_COMMUNITY): Payer: Self-pay

## 2017-08-09 ENCOUNTER — Encounter (HOSPITAL_COMMUNITY): Payer: Self-pay

## 2017-08-12 ENCOUNTER — Encounter (HOSPITAL_COMMUNITY)
Admission: RE | Admit: 2017-08-12 | Discharge: 2017-08-12 | Disposition: A | Discharge status: Home / Self Care | Payer: Self-pay | Source: Ambulatory Visit | Attending: Cardiovascular Disease | Admitting: Cardiovascular Disease

## 2017-08-12 DIAGNOSIS — Z955 Presence of coronary angioplasty implant and graft: Secondary | ICD-10-CM | POA: Insufficient documentation

## 2017-08-14 ENCOUNTER — Encounter (HOSPITAL_COMMUNITY)
Admission: RE | Admit: 2017-08-14 | Discharge: 2017-08-14 | Disposition: A | Discharge status: Home / Self Care | Payer: Self-pay | Source: Ambulatory Visit | Attending: Cardiovascular Disease | Admitting: Cardiovascular Disease

## 2017-08-14 NOTE — Progress Notes (Signed)
Chief Complaint  Patient presents with  . Follow-up    CAD     History of Present Illness: 66 yo male with history of CAD, HTN, HLD, GERD who is here today for cardiac follow up. He was evaluated in November 2017 by Dr. Donnie Aho for coronary calcification and had a stress test that showed ischemia. I performed a cardiac cath on 09/21/16 which showed a calcified 99% mid RCA stenosis. I brought him back on 09/24/16 for [facility] orbital atherectomy of the RCA followed by placement of a Synergy DES x 1 in the mid RCA. He did well following the procedure. Echo 05/20/17 with normal LV systolic function and no valve disease. He is doing well in cardiac rehab.   He is here today for follow up. The patient denies any chest pain, dyspnea, palpitations, lower extremity edema, orthopnea, PND, dizziness, near syncope or syncope. He is going on a [employer] in 2 weeks.   Primary Care Physician: Titus Dubin., MD  Past Medical History:  Diagnosis Date  . BPH (benign prostatic hypertrophy)   . Coronary artery disease   . GERD (gastroesophageal reflux disease)   . H/O seasonal allergies   . Hyperlipemia   . Hypertension   . Prostatitis     Past Surgical History:  Procedure Laterality Date  . CARDIAC CATHETERIZATION N/A 09/21/2016   Procedure: Left Heart Cath and Coronary Angiography;  Surgeon: [name], MD;  Location: [facility] INVASIVE CV LAB;  Service: Cardiovascular;  Laterality: N/A;  . CARDIAC CATHETERIZATION N/A 09/24/2016   Procedure: Coronary Stent Intervention;  Surgeon: [name], MD;  Location: [facility] INVASIVE CV LAB;  Service: Cardiovascular;  Laterality: N/A;  . CARDIAC CATHETERIZATION N/A 09/24/2016   Procedure: Coronary/Graft Atherectomy;  Surgeon: [name], MD;  Location: [facility] INVASIVE CV LAB;  Service: Cardiovascular;  Laterality: N/A;  . CARDIAC CATHETERIZATION N/A 09/24/2016   Procedure: Temporary Pacemaker;  Surgeon: [name], MD;  Location: [facility] INVASIVE CV LAB;  Service: Cardiovascular;  Laterality: N/A;  . CARDIOVASCULAR STRESS TEST  09/14/2016  . COLONOSCOPY    . CORONARY STENT PLACEMENT    . LAPAROSCOPIC APPENDECTOMY N/A 07/14/2016   Procedure: APPENDECTOMY LAPAROSCOPIC;  Surgeon: Ovidio Kin, MD;  Location: [facility] ORS;  Service: General;  Laterality: N/A;  . TOTAL HIP ARTHROPLASTY    . WISDOM TOOTH EXTRACTION      Current Outpatient Prescriptions  Medication Sig Dispense Refill  . aspirin EC 81 MG tablet Take 81 mg by mouth daily.     . calcium carbonate (TUMS - DOSED IN MG ELEMENTAL CALCIUM) 500 MG chewable tablet Chew 1 tablet by mouth 2 (two) times daily.    . clopidogrel (PLAVIX) 75 MG tablet Take 1 tablet (75 mg total) by mouth daily. 10 tablet 0  . Coenzyme Q10 (CO Q 10 PO) Take 1 capsule by mouth 2 (two) times daily.    . Cyanocobalamin (RA VITAMIN B-12 TR) 1000 MCG TBCR Take 1,000 mcg by mouth daily.     . fenofibrate 160 MG tablet Take 160 mg by mouth daily.  0  . fluticasone (VERAMYST) 27.5 MCG/SPRAY nasal spray Place 1 spray into the nose 2 (two) times daily.    . folic acid (FOLVITE) 1 MG tablet Take 1 mg by mouth 2 (two) times daily.    . irbesartan (AVAPRO) 150 MG tablet Take 1 tablet (150 mg total) by mouth daily. 90 tablet 3  . Melatonin 3 MG TABS Take 3 mg  by mouth at bedtime as needed (sleep).    . metoprolol tartrate (LOPRESSOR) 25 MG tablet Take 1 tablet (25 mg total) by mouth 2 (two) times daily. 20 tablet 0  . montelukast (SINGULAIR) 10 MG tablet Take 10 mg by mouth daily.   1  . NIACIN PO Take 1 tablet by mouth daily.     . nitroGLYCERIN (NITROSTAT) 0.4 MG SL tablet Place 1 tablet (0.4 mg total) under the tongue every 5 (five) minutes as needed. 25 tablet 3  . Omega-3 Fatty Acids (FISH OIL PO) Take 1 capsule by mouth daily.    . pantoprazole (PROTONIX) 40 MG tablet Take 1 tablet (40 mg total) by mouth daily. 10 tablet 0  . Plant Sterol Stanol-Pantethine (CHOLESTOFF COMPLETE)  300-100 MG CAPS Take 2 capsules by mouth every evening.     . rosuvastatin (CRESTOR) 5 MG tablet 5 mg. Take one tablet by mouth twice weekly     No current facility-administered medications for this visit.     Allergies  Allergen Reactions  . Penicillins Swelling and Other (See Comments)    Dyspnea-childhood allergy Has patient had a PCN reaction causing immediate rash, facial/tongue/throat swelling, SOB or lightheadedness with hypotension:unsure Has patient had a PCN reaction causing severe rash involving mucus membranes or skin necrosis:unsure Has patient had a PCN reaction that required hospitalization:unsure Has patient had a PCN reaction occurring within the last 10 years:No If all of the above answers are "NO", then may proceed with Cephalosporin use.    . Rosuvastatin     Other reaction(s): Other (See Comments) Elevated LFT    Social History   Social History  . Marital status: Married    Spouse name: N/A  . Number of children: 2  . Years of education: N/A   Occupational History  . sales    Social History Main Topics  . Smoking status: Former Smoker    Packs/day: 0.50    Years: 12.00    Types: Cigarettes    Quit date: 11/17/1978  . Smokeless tobacco: Never Used  . Alcohol use 0.6 oz/week    1 Glasses of wine per week  . Drug use: No  . Sexual activity: Not on file   Other Topics Concern  . Not on file   Social History Narrative  . No narrative on file    Family History  Problem Relation Age of Onset  . Coronary artery disease Mother 68  . CVA Mother   . Heart attack Father 3  . Microcephaly Paternal Uncle   . Heart attack Paternal Uncle     Review of Systems:  As stated in the HPI and otherwise negative.   BP 110/72   Pulse (!) 56   Ht  (1.702 m)   Wt 177 lb 1.9 oz (80.3 kg)   SpO2 98%   BMI 27.74 kg/m   Physical Examination:  General: Well developed, well nourished, NAD  HEENT: OP clear, mucus membranes moist  SKIN: warm, dry. No  rashes. Neuro: No focal deficits  Musculoskeletal: Muscle strength 5/5 all ext  Psychiatric: Mood and affect normal  Neck: No JVD, no carotid bruits, no thyromegaly, no lymphadenopathy.  Lungs:Clear bilaterally, no wheezes, rhonci, crackles Cardiovascular: Regular rate and rhythm. No murmurs, gallops or rubs. Abdomen:Soft. Bowel sounds present. Non-tender.  Extremities: No lower extremity edema. Pulses are 2 + in the bilateral DP/PT.  Echo 05/20/17: - Left ventricle: The cavity size was normal. Systolic function was   normal. The estimated ejection  fraction was in the range of 55%   to 60%. Wall motion was normal; there were no regional wall   motion abnormalities. Left ventricular diastolic function   parameters were normal.  EKG:  EKG is  ordered today. The ekg ordered today demonstrates Sinus brady, rate 55 bpm  Recent Labs: 09/25/2016: BUN 14; Creatinine, Ser 1.04; Hemoglobin 12.5; Platelets 183; Potassium 4.6; Sodium 140   Lipid Panel No results found for: CHOL, TRIG, HDL, CHOLHDL, VLDL, LDLCALC, LDLDIRECT   Wt Readings from Last 3 Encounters:  08/15/17 177 lb 1.9 oz (80.3 kg)  04/17/17 174 lb 12.8 oz (79.3 kg)  03/25/17 176 lb 5.9 oz (80 kg)     Other studies Reviewed: Additional studies/ records that were reviewed today include: . Review of the above records demonstrates:   Assessment and Plan:   1. CAD without angina: He is not having chest pain. He had stenting of the mid RCA in November 2017. Will continue ASA, Plavix, beta blocker and ARB.    2. GERD: continue Protonix  Current medicines are reviewed at length with the patient today.  The patient does not have concerns regarding medicines.  The following changes have been made:  no change  Labs/ tests ordered today include:   Orders Placed This Encounter  Procedures  . EKG 12-Lead   Disposition:   FU with me in 6  months  Signed, Verne Carrow, MD 08/15/2017 10:58 AM    [facility]  [facility] [address] [place], [place], [place]  [address] Phone: [phone]; Fax: [phone]

## 2017-08-15 ENCOUNTER — Encounter: Payer: Self-pay | Admitting: Cardiovascular Disease

## 2017-08-15 ENCOUNTER — Ambulatory Visit (INDEPENDENT_AMBULATORY_CARE_PROVIDER_SITE_OTHER): Payer: 59 | Admitting: Cardiovascular Disease

## 2017-08-15 ENCOUNTER — Telehealth: Payer: Self-pay | Admitting: Cardiovascular Disease

## 2017-08-15 VITALS — BP 110/72 | HR 56 | Ht 67.0 in | Wt 177.1 lb

## 2017-08-15 DIAGNOSIS — K219 Gastro-esophageal reflux disease without esophagitis: Secondary | ICD-10-CM

## 2017-08-15 DIAGNOSIS — I2510 Atherosclerotic heart disease of native coronary artery without angina pectoris: Secondary | ICD-10-CM

## 2017-08-15 NOTE — Patient Instructions (Signed)

## 2017-08-15 NOTE — Telephone Encounter (Signed)
New message    [facility] calling, states surgery details not confirmed at this time. Please call Sheri [phone] ext [phone] if the patient does not arrive with surg clearance form.

## 2017-08-15 NOTE — Telephone Encounter (Signed)
Pt saw Dr. Clifton James today and is not planning on having surgery.  I spoke with Sherri at [facility].

## 2017-08-16 ENCOUNTER — Encounter (HOSPITAL_COMMUNITY)
Admission: RE | Admit: 2017-08-16 | Discharge: 2017-08-16 | Disposition: A | Discharge status: Home / Self Care | Payer: Self-pay | Source: Ambulatory Visit | Attending: Cardiovascular Disease | Admitting: Cardiovascular Disease

## 2017-08-19 ENCOUNTER — Encounter (HOSPITAL_COMMUNITY)
Admission: RE | Admit: 2017-08-19 | Discharge: 2017-08-19 | Disposition: A | Discharge status: Home / Self Care | Payer: Self-pay | Source: Ambulatory Visit | Attending: Cardiovascular Disease | Admitting: Cardiovascular Disease

## 2017-08-21 ENCOUNTER — Encounter (HOSPITAL_COMMUNITY): Payer: Self-pay

## 2017-08-23 ENCOUNTER — Encounter (HOSPITAL_COMMUNITY)
Admission: RE | Admit: 2017-08-23 | Discharge: 2017-08-23 | Disposition: A | Discharge status: Home / Self Care | Payer: Self-pay | Source: Ambulatory Visit | Attending: Cardiovascular Disease | Admitting: Cardiovascular Disease

## 2017-08-26 ENCOUNTER — Encounter (HOSPITAL_COMMUNITY)
Admission: RE | Admit: 2017-08-26 | Discharge: 2017-08-26 | Disposition: A | Discharge status: Home / Self Care | Payer: Self-pay | Source: Ambulatory Visit | Attending: Cardiovascular Disease | Admitting: Cardiovascular Disease

## 2017-08-28 ENCOUNTER — Encounter (HOSPITAL_COMMUNITY): Payer: Self-pay

## 2017-08-30 ENCOUNTER — Encounter (HOSPITAL_COMMUNITY): Payer: Self-pay

## 2017-09-02 ENCOUNTER — Encounter (HOSPITAL_COMMUNITY): Payer: Self-pay

## 2017-09-04 ENCOUNTER — Encounter (HOSPITAL_COMMUNITY): Payer: Self-pay

## 2017-09-06 ENCOUNTER — Encounter (HOSPITAL_COMMUNITY): Payer: Self-pay

## 2017-09-09 ENCOUNTER — Encounter (HOSPITAL_COMMUNITY)
Admission: RE | Admit: 2017-09-09 | Discharge: 2017-09-09 | Disposition: A | Discharge status: Home / Self Care | Payer: Self-pay | Source: Ambulatory Visit | Attending: Cardiovascular Disease | Admitting: Cardiovascular Disease

## 2017-09-11 ENCOUNTER — Encounter (HOSPITAL_COMMUNITY)
Admission: RE | Admit: 2017-09-11 | Discharge: 2017-09-11 | Disposition: A | Discharge status: Home / Self Care | Payer: Self-pay | Source: Ambulatory Visit | Attending: Cardiovascular Disease | Admitting: Cardiovascular Disease

## 2017-09-13 ENCOUNTER — Encounter (HOSPITAL_COMMUNITY)
Admission: RE | Admit: 2017-09-13 | Discharge: 2017-09-13 | Disposition: A | Discharge status: Home / Self Care | Payer: Self-pay | Source: Ambulatory Visit | Attending: Cardiovascular Disease | Admitting: Cardiovascular Disease

## 2017-09-13 DIAGNOSIS — Z955 Presence of coronary angioplasty implant and graft: Secondary | ICD-10-CM | POA: Insufficient documentation

## 2017-09-16 DIAGNOSIS — N183 Chronic kidney disease, stage 3 unspecified: Secondary | ICD-10-CM | POA: Insufficient documentation

## 2017-09-18 ENCOUNTER — Encounter (HOSPITAL_COMMUNITY): Payer: Self-pay

## 2017-09-20 ENCOUNTER — Encounter (HOSPITAL_COMMUNITY)
Admission: RE | Admit: 2017-09-20 | Discharge: 2017-09-20 | Disposition: A | Discharge status: Home / Self Care | Payer: Self-pay | Source: Ambulatory Visit | Attending: Cardiovascular Disease | Admitting: Cardiovascular Disease

## 2017-09-23 ENCOUNTER — Encounter (HOSPITAL_COMMUNITY)
Admission: RE | Admit: 2017-09-23 | Discharge: 2017-09-23 | Disposition: A | Discharge status: Home / Self Care | Payer: Self-pay | Source: Ambulatory Visit | Attending: Cardiovascular Disease | Admitting: Cardiovascular Disease

## 2017-09-25 ENCOUNTER — Encounter (HOSPITAL_COMMUNITY): Payer: Self-pay

## 2017-09-27 ENCOUNTER — Encounter (HOSPITAL_COMMUNITY): Payer: Self-pay

## 2017-09-30 ENCOUNTER — Other Ambulatory Visit: Payer: Self-pay | Admitting: *Deleted

## 2017-09-30 ENCOUNTER — Encounter (HOSPITAL_COMMUNITY)
Admission: RE | Admit: 2017-09-30 | Discharge: 2017-09-30 | Disposition: A | Discharge status: Home / Self Care | Payer: Self-pay | Source: Ambulatory Visit | Attending: Cardiovascular Disease | Admitting: Cardiovascular Disease

## 2017-09-30 MED ORDER — CLOPIDOGREL BISULFATE 75 MG PO TABS: 75.0000 mg | ORAL_TABLET | Freq: Every day | ORAL | 10 refills | Status: DC

## 2017-10-02 ENCOUNTER — Encounter (HOSPITAL_COMMUNITY)
Admission: RE | Admit: 2017-10-02 | Discharge: 2017-10-02 | Disposition: A | Discharge status: Home / Self Care | Payer: Self-pay | Source: Ambulatory Visit | Attending: Cardiovascular Disease | Admitting: Cardiovascular Disease

## 2017-10-07 ENCOUNTER — Encounter (HOSPITAL_COMMUNITY): Payer: Self-pay

## 2017-10-09 ENCOUNTER — Encounter (HOSPITAL_COMMUNITY)
Admission: RE | Admit: 2017-10-09 | Discharge: 2017-10-09 | Disposition: A | Discharge status: Home / Self Care | Payer: Self-pay | Source: Ambulatory Visit | Attending: Cardiovascular Disease | Admitting: Cardiovascular Disease

## 2017-10-10 ENCOUNTER — Other Ambulatory Visit: Payer: Self-pay | Admitting: *Deleted

## 2017-10-10 MED ORDER — NITROGLYCERIN 0.4 MG SL SUBL: 0.4000 mg | SUBLINGUAL_TABLET | SUBLINGUAL | 5 refills | Status: DC | PRN

## 2017-10-11 ENCOUNTER — Encounter (HOSPITAL_COMMUNITY)
Admission: RE | Admit: 2017-10-11 | Discharge: 2017-10-11 | Disposition: A | Discharge status: Home / Self Care | Payer: Self-pay | Source: Ambulatory Visit | Attending: Cardiovascular Disease | Admitting: Cardiovascular Disease

## 2017-10-14 ENCOUNTER — Encounter (HOSPITAL_COMMUNITY)
Admission: RE | Admit: 2017-10-14 | Discharge: 2017-10-14 | Disposition: A | Discharge status: Home / Self Care | Payer: 59 | Source: Ambulatory Visit | Attending: Cardiovascular Disease | Admitting: Cardiovascular Disease

## 2017-10-14 DIAGNOSIS — Z955 Presence of coronary angioplasty implant and graft: Secondary | ICD-10-CM | POA: Insufficient documentation

## 2017-10-16 ENCOUNTER — Encounter (HOSPITAL_COMMUNITY): Payer: Self-pay

## 2017-10-18 ENCOUNTER — Encounter (HOSPITAL_COMMUNITY)
Admission: RE | Admit: 2017-10-18 | Discharge: 2017-10-18 | Disposition: A | Discharge status: Home / Self Care | Payer: 59 | Source: Ambulatory Visit | Attending: Cardiovascular Disease | Admitting: Cardiovascular Disease

## 2017-10-21 ENCOUNTER — Encounter (HOSPITAL_COMMUNITY): Payer: Self-pay

## 2017-10-23 ENCOUNTER — Encounter (HOSPITAL_COMMUNITY): Payer: Self-pay

## 2017-10-25 ENCOUNTER — Encounter (HOSPITAL_COMMUNITY): Payer: Self-pay

## 2017-10-28 ENCOUNTER — Encounter (HOSPITAL_COMMUNITY)
Admission: RE | Admit: 2017-10-28 | Discharge: 2017-10-28 | Disposition: A | Discharge status: Home / Self Care | Payer: 59 | Source: Ambulatory Visit | Attending: Cardiovascular Disease | Admitting: Cardiovascular Disease

## 2017-10-30 ENCOUNTER — Encounter (HOSPITAL_COMMUNITY)
Admission: RE | Admit: 2017-10-30 | Discharge: 2017-10-30 | Disposition: A | Discharge status: Home / Self Care | Payer: 59 | Source: Ambulatory Visit | Attending: Cardiovascular Disease | Admitting: Cardiovascular Disease

## 2017-10-30 ENCOUNTER — Other Ambulatory Visit: Payer: Self-pay | Admitting: Cardiovascular Disease

## 2017-10-30 MED ORDER — METOPROLOL TARTRATE 25 MG PO TABS: 25.0000 mg | ORAL_TABLET | Freq: Two times a day (BID) | ORAL | 9 refills | Status: DC

## 2017-10-30 NOTE — Telephone Encounter (Signed)
Pt's medication was sent to pt's pharmacy as requested. Confirmation received.  °

## 2017-11-01 ENCOUNTER — Encounter (HOSPITAL_COMMUNITY)
Admission: RE | Admit: 2017-11-01 | Discharge: 2017-11-01 | Disposition: A | Discharge status: Home / Self Care | Payer: 59 | Source: Ambulatory Visit | Attending: Cardiovascular Disease | Admitting: Cardiovascular Disease

## 2017-11-06 ENCOUNTER — Encounter (HOSPITAL_COMMUNITY): Payer: Self-pay

## 2017-11-08 ENCOUNTER — Encounter (HOSPITAL_COMMUNITY): Payer: Self-pay

## 2017-11-11 ENCOUNTER — Encounter (HOSPITAL_COMMUNITY): Payer: Self-pay

## 2017-11-13 ENCOUNTER — Encounter (HOSPITAL_COMMUNITY): Payer: Self-pay

## 2017-11-13 DIAGNOSIS — Z955 Presence of coronary angioplasty implant and graft: Secondary | ICD-10-CM | POA: Insufficient documentation

## 2017-11-15 ENCOUNTER — Encounter (HOSPITAL_COMMUNITY): Payer: Self-pay

## 2017-11-18 ENCOUNTER — Encounter (HOSPITAL_COMMUNITY)
Admission: RE | Admit: 2017-11-18 | Discharge: 2017-11-18 | Disposition: A | Discharge status: Home / Self Care | Payer: Self-pay | Source: Ambulatory Visit | Attending: Cardiovascular Disease | Admitting: Cardiovascular Disease

## 2017-11-20 ENCOUNTER — Encounter (HOSPITAL_COMMUNITY)
Admission: RE | Admit: 2017-11-20 | Discharge: 2017-11-20 | Disposition: A | Discharge status: Home / Self Care | Payer: Self-pay | Source: Ambulatory Visit | Attending: Cardiovascular Disease | Admitting: Cardiovascular Disease

## 2017-11-22 ENCOUNTER — Encounter (HOSPITAL_COMMUNITY)
Admission: RE | Admit: 2017-11-22 | Discharge: 2017-11-22 | Disposition: A | Discharge status: Home / Self Care | Payer: Self-pay | Source: Ambulatory Visit | Attending: Cardiovascular Disease | Admitting: Cardiovascular Disease

## 2017-11-25 ENCOUNTER — Encounter (HOSPITAL_COMMUNITY)
Admission: RE | Admit: 2017-11-25 | Discharge: 2017-11-25 | Disposition: A | Discharge status: Home / Self Care | Payer: Self-pay | Source: Ambulatory Visit | Attending: Cardiovascular Disease | Admitting: Cardiovascular Disease

## 2017-11-27 ENCOUNTER — Encounter (HOSPITAL_COMMUNITY)
Admission: RE | Admit: 2017-11-27 | Discharge: 2017-11-27 | Disposition: A | Discharge status: Home / Self Care | Payer: Self-pay | Source: Ambulatory Visit | Attending: Cardiovascular Disease | Admitting: Cardiovascular Disease

## 2017-11-29 ENCOUNTER — Encounter (HOSPITAL_COMMUNITY)
Admission: RE | Admit: 2017-11-29 | Discharge: 2017-11-29 | Disposition: A | Discharge status: Home / Self Care | Payer: Self-pay | Source: Ambulatory Visit | Attending: Cardiovascular Disease | Admitting: Cardiovascular Disease

## 2017-12-02 ENCOUNTER — Encounter (HOSPITAL_COMMUNITY): Payer: Self-pay

## 2017-12-03 ENCOUNTER — Encounter (HOSPITAL_COMMUNITY)
Admission: RE | Admit: 2017-12-03 | Discharge: 2017-12-03 | Disposition: A | Discharge status: Home / Self Care | Payer: Self-pay | Source: Ambulatory Visit | Attending: Cardiovascular Disease | Admitting: Cardiovascular Disease

## 2017-12-04 ENCOUNTER — Encounter (HOSPITAL_COMMUNITY)
Admission: RE | Admit: 2017-12-04 | Discharge: 2017-12-04 | Disposition: A | Discharge status: Home / Self Care | Payer: Self-pay | Source: Ambulatory Visit | Attending: Cardiovascular Disease | Admitting: Cardiovascular Disease

## 2017-12-06 ENCOUNTER — Encounter (HOSPITAL_COMMUNITY)
Admission: RE | Admit: 2017-12-06 | Discharge: 2017-12-06 | Disposition: A | Discharge status: Home / Self Care | Payer: Self-pay | Source: Ambulatory Visit | Attending: Cardiovascular Disease | Admitting: Cardiovascular Disease

## 2017-12-09 ENCOUNTER — Encounter (HOSPITAL_COMMUNITY): Payer: Self-pay

## 2017-12-11 ENCOUNTER — Encounter (HOSPITAL_COMMUNITY): Payer: Self-pay

## 2017-12-13 ENCOUNTER — Encounter (HOSPITAL_COMMUNITY): Payer: Self-pay

## 2017-12-13 DIAGNOSIS — Z955 Presence of coronary angioplasty implant and graft: Secondary | ICD-10-CM | POA: Insufficient documentation

## 2017-12-16 ENCOUNTER — Encounter (HOSPITAL_COMMUNITY)
Admission: RE | Admit: 2017-12-16 | Discharge: 2017-12-16 | Disposition: A | Discharge status: Home / Self Care | Payer: Self-pay | Source: Ambulatory Visit | Attending: Cardiovascular Disease | Admitting: Cardiovascular Disease

## 2017-12-18 ENCOUNTER — Encounter (HOSPITAL_COMMUNITY)
Admission: RE | Admit: 2017-12-18 | Discharge: 2017-12-18 | Disposition: A | Discharge status: Home / Self Care | Payer: 59 | Source: Ambulatory Visit | Attending: Cardiovascular Disease | Admitting: Cardiovascular Disease

## 2017-12-20 ENCOUNTER — Encounter (HOSPITAL_COMMUNITY)
Admission: RE | Admit: 2017-12-20 | Discharge: 2017-12-20 | Disposition: A | Discharge status: Home / Self Care | Payer: 59 | Source: Ambulatory Visit | Attending: Cardiovascular Disease | Admitting: Cardiovascular Disease

## 2017-12-23 ENCOUNTER — Encounter (HOSPITAL_COMMUNITY)
Admission: RE | Admit: 2017-12-23 | Discharge: 2017-12-23 | Disposition: A | Discharge status: Home / Self Care | Payer: Self-pay | Source: Ambulatory Visit | Attending: Cardiovascular Disease | Admitting: Cardiovascular Disease

## 2017-12-25 ENCOUNTER — Encounter (HOSPITAL_COMMUNITY)
Admission: RE | Admit: 2017-12-25 | Discharge: 2017-12-25 | Disposition: A | Discharge status: Home / Self Care | Payer: 59 | Source: Ambulatory Visit | Attending: Cardiovascular Disease | Admitting: Cardiovascular Disease

## 2017-12-27 ENCOUNTER — Encounter (HOSPITAL_COMMUNITY)
Admission: RE | Admit: 2017-12-27 | Discharge: 2017-12-27 | Disposition: A | Discharge status: Home / Self Care | Payer: Self-pay | Source: Ambulatory Visit | Attending: Cardiovascular Disease | Admitting: Cardiovascular Disease

## 2017-12-30 ENCOUNTER — Encounter (HOSPITAL_COMMUNITY)
Admission: RE | Admit: 2017-12-30 | Discharge: 2017-12-30 | Disposition: A | Discharge status: Home / Self Care | Payer: Self-pay | Source: Ambulatory Visit | Attending: Cardiovascular Disease | Admitting: Cardiovascular Disease

## 2018-01-01 ENCOUNTER — Encounter (HOSPITAL_COMMUNITY)
Admission: RE | Admit: 2018-01-01 | Discharge: 2018-01-01 | Disposition: A | Discharge status: Home / Self Care | Payer: Self-pay | Source: Ambulatory Visit | Attending: Cardiovascular Disease | Admitting: Cardiovascular Disease

## 2018-01-03 ENCOUNTER — Encounter (HOSPITAL_COMMUNITY)
Admission: RE | Admit: 2018-01-03 | Discharge: 2018-01-03 | Disposition: A | Discharge status: Home / Self Care | Payer: 59 | Source: Ambulatory Visit | Attending: Cardiovascular Disease | Admitting: Cardiovascular Disease

## 2018-01-06 ENCOUNTER — Encounter (HOSPITAL_COMMUNITY)
Admission: RE | Admit: 2018-01-06 | Discharge: 2018-01-06 | Disposition: A | Discharge status: Home / Self Care | Payer: 59 | Source: Ambulatory Visit | Attending: Cardiovascular Disease | Admitting: Cardiovascular Disease

## 2018-01-08 ENCOUNTER — Encounter (HOSPITAL_COMMUNITY)
Admission: RE | Admit: 2018-01-08 | Discharge: 2018-01-08 | Disposition: A | Discharge status: Home / Self Care | Payer: 59 | Source: Ambulatory Visit | Attending: Cardiovascular Disease | Admitting: Cardiovascular Disease

## 2018-01-10 ENCOUNTER — Encounter (HOSPITAL_COMMUNITY)
Admission: RE | Admit: 2018-01-10 | Discharge: 2018-01-10 | Disposition: A | Discharge status: Home / Self Care | Payer: 59 | Source: Ambulatory Visit | Attending: Cardiovascular Disease | Admitting: Cardiovascular Disease

## 2018-01-10 DIAGNOSIS — Z955 Presence of coronary angioplasty implant and graft: Secondary | ICD-10-CM | POA: Insufficient documentation

## 2018-01-13 ENCOUNTER — Encounter (HOSPITAL_COMMUNITY)
Admission: RE | Admit: 2018-01-13 | Discharge: 2018-01-13 | Disposition: A | Discharge status: Home / Self Care | Payer: Self-pay | Source: Ambulatory Visit | Attending: Cardiovascular Disease | Admitting: Cardiovascular Disease

## 2018-01-15 ENCOUNTER — Encounter (HOSPITAL_COMMUNITY)
Admission: RE | Admit: 2018-01-15 | Discharge: 2018-01-15 | Disposition: A | Discharge status: Home / Self Care | Payer: 59 | Source: Ambulatory Visit | Attending: Cardiovascular Disease | Admitting: Cardiovascular Disease

## 2018-01-17 ENCOUNTER — Encounter (HOSPITAL_COMMUNITY): Payer: Self-pay

## 2018-01-20 ENCOUNTER — Encounter (HOSPITAL_COMMUNITY)
Admission: RE | Admit: 2018-01-20 | Discharge: 2018-01-20 | Disposition: A | Discharge status: Home / Self Care | Payer: 59 | Source: Ambulatory Visit | Attending: Cardiovascular Disease | Admitting: Cardiovascular Disease

## 2018-01-22 ENCOUNTER — Ambulatory Visit (INDEPENDENT_AMBULATORY_CARE_PROVIDER_SITE_OTHER): Payer: Medicare Other | Admitting: Cardiovascular Disease

## 2018-01-22 ENCOUNTER — Encounter (HOSPITAL_COMMUNITY)
Admission: RE | Admit: 2018-01-22 | Discharge: 2018-01-22 | Disposition: A | Discharge status: Home / Self Care | Payer: 59 | Source: Ambulatory Visit | Attending: Cardiovascular Disease | Admitting: Cardiovascular Disease

## 2018-01-22 ENCOUNTER — Encounter: Payer: Self-pay | Admitting: Cardiovascular Disease

## 2018-01-22 VITALS — BP 100/68 | HR 74 | Ht 67.0 in | Wt 176.8 lb

## 2018-01-22 DIAGNOSIS — I2510 Atherosclerotic heart disease of native coronary artery without angina pectoris: Secondary | ICD-10-CM | POA: Diagnosis not present

## 2018-01-22 NOTE — Patient Instructions (Signed)
Medication Instructions:  Your physician recommends that you continue on your current medications as directed. Please refer to the Current Medication list given to you today.   Labwork: none  Testing/Procedures: none  Follow-Up: Your physician recommends that you schedule a follow-up appointment in: 12 months. Please call our office in 9 months to schedule this appointment    Any Other Special Instructions Will Be Listed Below (If Applicable).     If you need a refill on your cardiac medications before your next appointment, please call your pharmacy.   

## 2018-01-22 NOTE — Progress Notes (Signed)
Chief Complaint  Patient presents with  . Follow-up    CAD     History of Present Illness: 67 yo male with history of CAD, HTN, HLD, GERD who is here today for cardiac follow up. He was evaluated in November 2017 by Dr. Donnie Ahoilley for coronary calcification and had a stress test that showed ischemia. I performed a cardiac cath on 09/21/16 which showed a calcified 99% mid RCA stenosis. [facility] followed by placement of a Synergy DES x 1 in the mid RCA. He did well following the procedure. Echo 05/20/17 with normal LV systolic function and no valve disease.  He is here today for follow up. The patient denies any chest pain, dyspnea, palpitations, lower extremity edema, orthopnea, PND, dizziness, near syncope or syncope. He remains in cardiac rehab.  He is having some joint aches on the Crestor 5 mg daily.    Primary Care Physician: Titus DubinFutrell, Thomas M., MD  Past Medical History:  Diagnosis Date  . BPH (benign prostatic hypertrophy)   . Coronary artery disease   . GERD (gastroesophageal reflux disease)   . H/O seasonal allergies   . Hyperlipemia   . Hypertension   . Prostatitis     Past Surgical History:  Procedure Laterality Date  . CARDIAC CATHETERIZATION N/A 09/21/2016   Procedure: Left Heart Cath and Coronary Angiography;  Surgeon: Kathleene Hazelhristopher D Sami Froh, MD;  [facility];  Service: Cardiovascular;  Laterality: N/A;  . CARDIAC CATHETERIZATION N/A 09/24/2016   Procedure: Coronary Stent Intervention;  Surgeon: Kathleene Hazelhristopher D Bertice Risse, MD;  Location: [facility] INVASIVE CV LAB;  Service: Cardiovascular;  Laterality: N/A;  . CARDIAC CATHETERIZATION N/A 09/24/2016   Procedure: Coronary/Graft Atherectomy;  Surgeon: Kathleene Hazelhristopher D Hilaria Titsworth, MD;  Location: [facility] INVASIVE CV LAB;  Service: Cardiovascular;  Laterality: N/A;  . CARDIAC CATHETERIZATION N/A 09/24/2016   Procedure: Temporary Pacemaker;  Surgeon: Kathleene Hazelhristopher D  Etheridge Geil, MD;  Location: [facility] INVASIVE CV LAB;  Service: Cardiovascular;  Laterality: N/A;  . CARDIOVASCULAR STRESS TEST  09/14/2016  . COLONOSCOPY    . CORONARY STENT PLACEMENT    . LAPAROSCOPIC APPENDECTOMY N/A 07/14/2016   Procedure: APPENDECTOMY LAPAROSCOPIC;  Surgeon: Ovidio Kinavid Newman, MD;  Location: [facility] ORS;  Service: General;  Laterality: N/A;  . TOTAL HIP ARTHROPLASTY    . WISDOM TOOTH EXTRACTION      Current Outpatient Medications  Medication Sig Dispense Refill  . aspirin EC 81 MG tablet Take 81 mg by mouth daily.     . calcium carbonate (TUMS - DOSED IN MG ELEMENTAL CALCIUM) 500 MG chewable tablet Chew 1 tablet by mouth 2 (two) times daily.    . clopidogrel (PLAVIX) 75 MG tablet Take 1 tablet (75 mg total) daily by mouth. 30 tablet 10  . Coenzyme Q10 (CO Q 10 PO) Take 1 capsule by mouth 2 (two) times daily.    . Cyanocobalamin (RA VITAMIN B-12 TR) 1000 MCG TBCR Take 1,000 mcg by mouth daily.     [name]. esomeprazole (NEXIUM) 20 MG capsule Take 20 mg by mouth daily.    . fenofibrate 160 MG tablet Take 160 mg by mouth daily.  0  . fluticasone (VERAMYST) 27.5 MCG/SPRAY nasal spray Place 1 spray into the nose 2 (two) times daily.    . folic acid (FOLVITE) 1 MG tablet Take 400 mg by mouth 2 (two) times daily.     . irbesartan (AVAPRO) 150 MG tablet Take 1 tablet (150 mg  total) by mouth daily. 90 tablet 3  . Melatonin 5 MG TABS Take 5 mg by mouth at bedtime as needed (sleep).     . metoprolol tartrate (LOPRESSOR) 25 MG tablet Take 1 tablet (25 mg total) by mouth 2 (two) times daily. 60 tablet 9  . montelukast (SINGULAIR) 10 MG tablet Take 10 mg by mouth daily.   1  . NIACIN PO Take 500 mg by mouth daily.     . nitroGLYCERIN (NITROSTAT) 0.4 MG SL tablet Place 1 tablet (0.4 mg total) under the tongue every 5 (five) minutes as needed. 25 tablet 5  . Omega-3 Fatty Acids (FISH OIL PO) Take 1 capsule by mouth daily.    . pantoprazole (PROTONIX) 40 MG tablet Take 1 tablet (40 mg total) by mouth daily. 10  tablet 0  . Plant Sterol Stanol-Pantethine (CHOLESTOFF COMPLETE) 300-100 MG CAPS Take 2 capsules by mouth every evening.     . rosuvastatin (CRESTOR) 5 MG tablet 5 mg. Take one tablet by mouth twice weekly     No current facility-administered medications for this visit.     Allergies  Allergen Reactions  . Penicillins Swelling and Other (See Comments)    Dyspnea-childhood allergy Has patient had a PCN reaction causing immediate rash, facial/tongue/throat swelling, SOB or lightheadedness with hypotension:unsure Has patient had a PCN reaction causing severe rash involving mucus membranes or skin necrosis:unsure Has patient had a PCN reaction that required hospitalization:unsure Has patient had a PCN reaction occurring within the last 10 years:No If all of the above answers are "NO", then may proceed with Cephalosporin use.    . Rosuvastatin     Other reaction(s): Other (See Comments) Elevated LFT    Social History   Socioeconomic History  . Marital status: Married    Spouse name: Not on file  . Number of children: 2  . Years of education: Not on file  . Highest education level: Not on file  Social Needs  . Financial resource strain: Not on file  . Food insecurity - worry: Not on file  . Food insecurity - inability: Not on file  . Transportation needs - medical: Not on file  . Transportation needs - non-medical: Not on file  Occupational History  . Occupation: Airline pilot  Tobacco Use  . Smoking status: Former Smoker    Packs/day: 0.50    Years: 12.00    Pack years: 6.00    Types: Cigarettes    Last attempt to quit: 11/17/1978    Years since quitting: 39.2  . Smokeless tobacco: Never Used  Substance and Sexual Activity  . Alcohol use: Yes    Alcohol/week: 0.6 oz    Types: 1 Glasses of wine per week  . Drug use: No  . Sexual activity: Not on file  Other Topics Concern  . Not on file  Social History Narrative  . Not on file    Family History  Problem Relation Age of  Onset  . Coronary artery disease Mother 85  . CVA Mother   . Heart attack Father 39  . Microcephaly Paternal Uncle   . Heart attack Paternal Uncle     Review of Systems:  As stated in the HPI and otherwise negative.   BP 100/68   Pulse 74   Ht 5\' 7"  (1.702 m)   Wt 176 lb 12.8 oz (80.2 kg)   SpO2 98%   BMI 27.69 kg/m   Physical Examination:  General: Well developed, well nourished, NAD  HEENT: OP  clear, mucus membranes moist  SKIN: warm, dry. No rashes. Neuro: No focal deficits  Musculoskeletal: Muscle strength 5/5 all ext  Psychiatric: Mood and affect normal  Neck: No JVD, no carotid bruits, no thyromegaly, no lymphadenopathy.  Lungs:Clear bilaterally, no wheezes, rhonci, crackles Cardiovascular: Regular rate and rhythm. No murmurs, gallops or rubs. Abdomen:Soft. Bowel sounds present. Non-tender.  Extremities: No lower extremity edema. Pulses are 2 + in the bilateral DP/PT.  Echo 05/20/17: - Left ventricle: The cavity size was normal. Systolic function was   normal. The estimated ejection fraction was in the range of 55%   to 60%. Wall motion was normal; there were no regional wall   motion abnormalities. Left ventricular diastolic function   parameters were normal.  EKG:  EKG is not ordered today. The ekg ordered today demonstrates   Recent Labs: No results found for requested labs within last 8760 hours.   Lipid Pane Followed in primary care   Wt Readings from Last 3 Encounters:  01/22/18 176 lb 12.8 oz (80.2 kg)  08/15/17 177 lb 1.9 oz (80.3 kg)  04/17/17 174 lb 12.8 oz (79.3 kg)     Other studies Reviewed: Additional studies/ records that were reviewed today include: . Review of the above records demonstrates:   Assessment and Plan:   1. CAD without angina: No chest pain suggestive of angina. He has done well following his PCI/stenting of the RCA in November 2017. Will continue ASA, Plavix, statin and beta blocker. He continues to tolerate very low dose  Crestor, 5 mg every day but is having some joint pain. This is managed in primary care.  We discussed Repatha or Praluent. He will research and let us know if he chooses to pursue this after discussion with DR. Futrell.  Current medicines are reviewed at length with the patient today.  The patient does not have concerns regarding medicines.  The following changes have been made:  no change  Labs/ tests ordered today include:   No orders of the defined types were placed in this encounter.  Disposition:   FU with me in 12  months  Signed, Verne Carrow, MD 01/22/2018 12:10 PM    [facility] [address] [place], [place], [place]  [address] Phone: [phone]; Fax: [phone]

## 2018-01-24 ENCOUNTER — Encounter (HOSPITAL_COMMUNITY): Payer: Self-pay

## 2018-01-27 ENCOUNTER — Encounter (HOSPITAL_COMMUNITY)
Admission: RE | Admit: 2018-01-27 | Discharge: 2018-01-27 | Disposition: A | Discharge status: Home / Self Care | Payer: Medicare Other | Source: Ambulatory Visit | Attending: Cardiovascular Disease | Admitting: Cardiovascular Disease

## 2018-01-29 ENCOUNTER — Encounter (HOSPITAL_COMMUNITY)
Admission: RE | Admit: 2018-01-29 | Discharge: 2018-01-29 | Disposition: A | Discharge status: Home / Self Care | Payer: Medicare Other | Source: Ambulatory Visit | Attending: Cardiovascular Disease | Admitting: Cardiovascular Disease

## 2018-01-31 ENCOUNTER — Encounter (HOSPITAL_COMMUNITY)
Admission: RE | Admit: 2018-01-31 | Discharge: 2018-01-31 | Disposition: A | Discharge status: Home / Self Care | Payer: Medicare Other | Source: Ambulatory Visit | Attending: Cardiovascular Disease | Admitting: Cardiovascular Disease

## 2018-02-03 ENCOUNTER — Encounter (HOSPITAL_COMMUNITY)
Admission: RE | Admit: 2018-02-03 | Discharge: 2018-02-03 | Disposition: A | Discharge status: Home / Self Care | Payer: Medicare Other | Source: Ambulatory Visit | Attending: Cardiovascular Disease | Admitting: Cardiovascular Disease

## 2018-02-05 ENCOUNTER — Encounter (HOSPITAL_COMMUNITY)
Admission: RE | Admit: 2018-02-05 | Discharge: 2018-02-05 | Disposition: A | Discharge status: Home / Self Care | Payer: Medicare Other | Source: Ambulatory Visit | Attending: Cardiovascular Disease | Admitting: Cardiovascular Disease

## 2018-02-07 ENCOUNTER — Encounter (HOSPITAL_COMMUNITY)
Admission: RE | Admit: 2018-02-07 | Discharge: 2018-02-07 | Disposition: A | Discharge status: Home / Self Care | Payer: Medicare Other | Source: Ambulatory Visit | Attending: Cardiovascular Disease | Admitting: Cardiovascular Disease

## 2018-02-10 ENCOUNTER — Encounter (HOSPITAL_COMMUNITY): Payer: Self-pay

## 2018-02-10 DIAGNOSIS — Z955 Presence of coronary angioplasty implant and graft: Secondary | ICD-10-CM | POA: Insufficient documentation

## 2018-02-12 ENCOUNTER — Encounter (HOSPITAL_COMMUNITY): Payer: Self-pay

## 2018-02-14 ENCOUNTER — Encounter (HOSPITAL_COMMUNITY): Payer: Self-pay

## 2018-02-17 ENCOUNTER — Encounter (HOSPITAL_COMMUNITY)
Admission: RE | Admit: 2018-02-17 | Discharge: 2018-02-17 | Disposition: A | Discharge status: Home / Self Care | Payer: Self-pay | Source: Ambulatory Visit | Attending: Cardiovascular Disease | Admitting: Cardiovascular Disease

## 2018-02-19 ENCOUNTER — Encounter (HOSPITAL_COMMUNITY)
Admission: RE | Admit: 2018-02-19 | Discharge: 2018-02-19 | Disposition: A | Discharge status: Home / Self Care | Payer: Self-pay | Source: Ambulatory Visit | Attending: Cardiovascular Disease | Admitting: Cardiovascular Disease

## 2018-02-21 ENCOUNTER — Encounter (HOSPITAL_COMMUNITY)
Admission: RE | Admit: 2018-02-21 | Discharge: 2018-02-21 | Disposition: A | Discharge status: Home / Self Care | Payer: Self-pay | Source: Ambulatory Visit | Attending: Cardiovascular Disease | Admitting: Cardiovascular Disease

## 2018-02-24 ENCOUNTER — Encounter (HOSPITAL_COMMUNITY)
Admission: RE | Admit: 2018-02-24 | Discharge: 2018-02-24 | Disposition: A | Discharge status: Home / Self Care | Payer: Self-pay | Source: Ambulatory Visit | Attending: Cardiovascular Disease | Admitting: Cardiovascular Disease

## 2018-02-26 ENCOUNTER — Encounter (HOSPITAL_COMMUNITY)
Admission: RE | Admit: 2018-02-26 | Discharge: 2018-02-26 | Disposition: A | Discharge status: Home / Self Care | Payer: Medicare Other | Source: Ambulatory Visit | Attending: Cardiovascular Disease | Admitting: Cardiovascular Disease

## 2018-02-27 ENCOUNTER — Other Ambulatory Visit: Payer: Self-pay | Admitting: Cardiovascular Disease

## 2018-02-28 ENCOUNTER — Encounter (HOSPITAL_COMMUNITY): Payer: Self-pay

## 2018-03-03 ENCOUNTER — Encounter (HOSPITAL_COMMUNITY): Payer: Self-pay

## 2018-03-05 ENCOUNTER — Encounter (HOSPITAL_COMMUNITY)
Admission: RE | Admit: 2018-03-05 | Discharge: 2018-03-05 | Disposition: A | Discharge status: Home / Self Care | Payer: Medicare Other | Source: Ambulatory Visit | Attending: Cardiovascular Disease | Admitting: Cardiovascular Disease

## 2018-03-07 ENCOUNTER — Encounter (HOSPITAL_COMMUNITY)
Admission: RE | Admit: 2018-03-07 | Discharge: 2018-03-07 | Disposition: A | Discharge status: Home / Self Care | Payer: Medicare Other | Source: Ambulatory Visit | Attending: Cardiovascular Disease | Admitting: Cardiovascular Disease

## 2018-03-10 ENCOUNTER — Encounter (HOSPITAL_COMMUNITY)
Admission: RE | Admit: 2018-03-10 | Discharge: 2018-03-10 | Disposition: A | Discharge status: Home / Self Care | Payer: Self-pay | Source: Ambulatory Visit | Attending: Cardiovascular Disease | Admitting: Cardiovascular Disease

## 2018-03-12 ENCOUNTER — Encounter (HOSPITAL_COMMUNITY)
Admission: RE | Admit: 2018-03-12 | Discharge: 2018-03-12 | Disposition: A | Discharge status: Home / Self Care | Payer: Medicare Other | Source: Ambulatory Visit | Attending: Cardiovascular Disease | Admitting: Cardiovascular Disease

## 2018-03-12 DIAGNOSIS — Z955 Presence of coronary angioplasty implant and graft: Secondary | ICD-10-CM | POA: Insufficient documentation

## 2018-03-14 ENCOUNTER — Encounter (HOSPITAL_COMMUNITY)
Admission: RE | Admit: 2018-03-14 | Discharge: 2018-03-14 | Disposition: A | Discharge status: Home / Self Care | Payer: Self-pay | Source: Ambulatory Visit | Attending: Cardiovascular Disease | Admitting: Cardiovascular Disease

## 2018-03-17 ENCOUNTER — Encounter (HOSPITAL_COMMUNITY)
Admission: RE | Admit: 2018-03-17 | Discharge: 2018-03-17 | Disposition: A | Discharge status: Home / Self Care | Payer: Medicare Other | Source: Ambulatory Visit | Attending: Cardiovascular Disease | Admitting: Cardiovascular Disease

## 2018-03-19 ENCOUNTER — Encounter (HOSPITAL_COMMUNITY)
Admission: RE | Admit: 2018-03-19 | Discharge: 2018-03-19 | Disposition: A | Discharge status: Home / Self Care | Payer: Self-pay | Source: Ambulatory Visit | Attending: Cardiovascular Disease | Admitting: Cardiovascular Disease

## 2018-03-21 ENCOUNTER — Encounter (HOSPITAL_COMMUNITY)
Admission: RE | Admit: 2018-03-21 | Discharge: 2018-03-21 | Disposition: A | Discharge status: Home / Self Care | Payer: Self-pay | Source: Ambulatory Visit | Attending: Cardiovascular Disease | Admitting: Cardiovascular Disease

## 2018-03-24 ENCOUNTER — Encounter (HOSPITAL_COMMUNITY)
Admission: RE | Admit: 2018-03-24 | Discharge: 2018-03-24 | Disposition: A | Discharge status: Home / Self Care | Payer: Medicare Other | Source: Ambulatory Visit | Attending: Cardiovascular Disease | Admitting: Cardiovascular Disease

## 2018-03-26 ENCOUNTER — Encounter (HOSPITAL_COMMUNITY)
Admission: RE | Admit: 2018-03-26 | Discharge: 2018-03-26 | Disposition: A | Discharge status: Home / Self Care | Payer: Medicare Other | Source: Ambulatory Visit | Attending: Cardiovascular Disease | Admitting: Cardiovascular Disease

## 2018-03-28 ENCOUNTER — Encounter (HOSPITAL_COMMUNITY)
Admission: RE | Admit: 2018-03-28 | Discharge: 2018-03-28 | Disposition: A | Discharge status: Home / Self Care | Payer: Self-pay | Source: Ambulatory Visit | Attending: Cardiovascular Disease | Admitting: Cardiovascular Disease

## 2018-03-31 ENCOUNTER — Encounter (HOSPITAL_COMMUNITY)
Admission: RE | Admit: 2018-03-31 | Discharge: 2018-03-31 | Disposition: A | Discharge status: Home / Self Care | Payer: Medicare Other | Source: Ambulatory Visit | Attending: Cardiovascular Disease | Admitting: Cardiovascular Disease

## 2018-04-02 ENCOUNTER — Encounter (HOSPITAL_COMMUNITY)
Admission: RE | Admit: 2018-04-02 | Discharge: 2018-04-02 | Disposition: A | Discharge status: Home / Self Care | Payer: Medicare Other | Source: Ambulatory Visit | Attending: Cardiovascular Disease | Admitting: Cardiovascular Disease

## 2018-04-04 ENCOUNTER — Encounter (HOSPITAL_COMMUNITY): Payer: Self-pay

## 2018-04-09 ENCOUNTER — Encounter (HOSPITAL_COMMUNITY): Payer: Self-pay

## 2018-04-11 ENCOUNTER — Encounter (HOSPITAL_COMMUNITY)
Admission: RE | Admit: 2018-04-11 | Discharge: 2018-04-11 | Disposition: A | Discharge status: Home / Self Care | Payer: Medicare Other | Source: Ambulatory Visit | Attending: Cardiovascular Disease | Admitting: Cardiovascular Disease

## 2018-04-14 ENCOUNTER — Encounter (HOSPITAL_COMMUNITY)
Admission: RE | Admit: 2018-04-14 | Discharge: 2018-04-14 | Disposition: A | Discharge status: Home / Self Care | Payer: Self-pay | Source: Ambulatory Visit | Attending: Cardiovascular Disease | Admitting: Cardiovascular Disease

## 2018-04-14 DIAGNOSIS — I2510 Atherosclerotic heart disease of native coronary artery without angina pectoris: Secondary | ICD-10-CM | POA: Insufficient documentation

## 2018-04-16 ENCOUNTER — Encounter (HOSPITAL_COMMUNITY)
Admission: RE | Admit: 2018-04-16 | Discharge: 2018-04-16 | Disposition: A | Discharge status: Home / Self Care | Payer: Self-pay | Source: Ambulatory Visit | Attending: Cardiovascular Disease | Admitting: Cardiovascular Disease

## 2018-04-18 ENCOUNTER — Encounter (HOSPITAL_COMMUNITY)
Admission: RE | Admit: 2018-04-18 | Discharge: 2018-04-18 | Disposition: A | Discharge status: Home / Self Care | Payer: Self-pay | Source: Ambulatory Visit | Attending: Cardiovascular Disease | Admitting: Cardiovascular Disease

## 2018-04-21 ENCOUNTER — Encounter (HOSPITAL_COMMUNITY)
Admission: RE | Admit: 2018-04-21 | Discharge: 2018-04-21 | Disposition: A | Discharge status: Home / Self Care | Payer: Self-pay | Source: Ambulatory Visit | Attending: Cardiovascular Disease | Admitting: Cardiovascular Disease

## 2018-04-23 ENCOUNTER — Encounter (HOSPITAL_COMMUNITY): Payer: Self-pay

## 2018-04-23 DIAGNOSIS — M1612 Unilateral primary osteoarthritis, left hip: Secondary | ICD-10-CM | POA: Insufficient documentation

## 2018-04-25 ENCOUNTER — Encounter (HOSPITAL_COMMUNITY): Payer: Self-pay

## 2018-04-28 ENCOUNTER — Encounter (HOSPITAL_COMMUNITY): Payer: Self-pay

## 2018-04-30 ENCOUNTER — Encounter (HOSPITAL_COMMUNITY): Payer: Self-pay

## 2018-05-02 ENCOUNTER — Encounter (HOSPITAL_COMMUNITY): Payer: Self-pay

## 2018-05-05 ENCOUNTER — Encounter (HOSPITAL_COMMUNITY): Payer: Self-pay

## 2018-05-07 ENCOUNTER — Encounter (HOSPITAL_COMMUNITY): Payer: Self-pay

## 2018-05-09 ENCOUNTER — Encounter (HOSPITAL_COMMUNITY): Payer: Self-pay

## 2018-05-12 ENCOUNTER — Encounter (HOSPITAL_COMMUNITY): Payer: Self-pay

## 2018-05-12 DIAGNOSIS — I2510 Atherosclerotic heart disease of native coronary artery without angina pectoris: Secondary | ICD-10-CM | POA: Insufficient documentation

## 2018-05-14 ENCOUNTER — Encounter (HOSPITAL_COMMUNITY): Payer: Self-pay

## 2018-05-16 ENCOUNTER — Encounter (HOSPITAL_COMMUNITY): Payer: Self-pay

## 2018-05-19 ENCOUNTER — Encounter (HOSPITAL_COMMUNITY)
Admission: RE | Admit: 2018-05-19 | Discharge: 2018-05-19 | Disposition: A | Discharge status: Home / Self Care | Payer: Medicare Other | Source: Ambulatory Visit | Attending: Cardiovascular Disease | Admitting: Cardiovascular Disease

## 2018-05-21 ENCOUNTER — Encounter (HOSPITAL_COMMUNITY)
Admission: RE | Admit: 2018-05-21 | Discharge: 2018-05-21 | Disposition: A | Discharge status: Home / Self Care | Payer: Medicare Other | Source: Ambulatory Visit | Attending: Cardiovascular Disease | Admitting: Cardiovascular Disease

## 2018-05-23 ENCOUNTER — Encounter (HOSPITAL_COMMUNITY)
Admission: RE | Admit: 2018-05-23 | Discharge: 2018-05-23 | Disposition: A | Discharge status: Home / Self Care | Payer: Medicare Other | Source: Ambulatory Visit | Attending: Cardiovascular Disease | Admitting: Cardiovascular Disease

## 2018-05-26 ENCOUNTER — Encounter (HOSPITAL_COMMUNITY)
Admission: RE | Admit: 2018-05-26 | Discharge: 2018-05-26 | Disposition: A | Discharge status: Home / Self Care | Payer: Medicare Other | Source: Ambulatory Visit | Attending: Cardiovascular Disease | Admitting: Cardiovascular Disease

## 2018-05-28 ENCOUNTER — Encounter (HOSPITAL_COMMUNITY)
Admission: RE | Admit: 2018-05-28 | Discharge: 2018-05-28 | Disposition: A | Discharge status: Home / Self Care | Payer: Medicare Other | Source: Ambulatory Visit | Attending: Cardiovascular Disease | Admitting: Cardiovascular Disease

## 2018-05-30 ENCOUNTER — Encounter (HOSPITAL_COMMUNITY)
Admission: RE | Admit: 2018-05-30 | Discharge: 2018-05-30 | Disposition: A | Discharge status: Home / Self Care | Payer: Medicare Other | Source: Ambulatory Visit | Attending: Cardiovascular Disease | Admitting: Cardiovascular Disease

## 2018-06-02 ENCOUNTER — Encounter (HOSPITAL_COMMUNITY)
Admission: RE | Admit: 2018-06-02 | Discharge: 2018-06-02 | Disposition: A | Discharge status: Home / Self Care | Payer: Medicare Other | Source: Ambulatory Visit | Attending: Cardiovascular Disease | Admitting: Cardiovascular Disease

## 2018-06-04 ENCOUNTER — Encounter (HOSPITAL_COMMUNITY)
Admission: RE | Admit: 2018-06-04 | Discharge: 2018-06-04 | Disposition: A | Discharge status: Home / Self Care | Payer: Medicare Other | Source: Ambulatory Visit | Attending: Cardiovascular Disease | Admitting: Cardiovascular Disease

## 2018-06-06 ENCOUNTER — Encounter (HOSPITAL_COMMUNITY)
Admission: RE | Admit: 2018-06-06 | Discharge: 2018-06-06 | Disposition: A | Discharge status: Home / Self Care | Payer: Medicare Other | Source: Ambulatory Visit | Attending: Cardiovascular Disease | Admitting: Cardiovascular Disease

## 2018-06-09 ENCOUNTER — Encounter (HOSPITAL_COMMUNITY): Payer: Self-pay

## 2018-06-11 ENCOUNTER — Encounter (HOSPITAL_COMMUNITY): Payer: Self-pay

## 2018-06-13 ENCOUNTER — Encounter (HOSPITAL_COMMUNITY): Payer: Self-pay

## 2018-06-13 DIAGNOSIS — I2510 Atherosclerotic heart disease of native coronary artery without angina pectoris: Secondary | ICD-10-CM | POA: Insufficient documentation

## 2018-06-16 ENCOUNTER — Encounter (HOSPITAL_COMMUNITY)
Admission: RE | Admit: 2018-06-16 | Discharge: 2018-06-16 | Disposition: A | Discharge status: Home / Self Care | Payer: Medicare Other | Source: Ambulatory Visit | Attending: Cardiovascular Disease | Admitting: Cardiovascular Disease

## 2018-06-18 ENCOUNTER — Encounter (HOSPITAL_COMMUNITY)
Admission: RE | Admit: 2018-06-18 | Discharge: 2018-06-18 | Disposition: A | Discharge status: Home / Self Care | Payer: Medicare Other | Source: Ambulatory Visit | Attending: Cardiovascular Disease | Admitting: Cardiovascular Disease

## 2018-06-20 ENCOUNTER — Encounter (HOSPITAL_COMMUNITY)
Admission: RE | Admit: 2018-06-20 | Discharge: 2018-06-20 | Disposition: A | Discharge status: Home / Self Care | Payer: Self-pay | Source: Ambulatory Visit | Attending: Cardiovascular Disease | Admitting: Cardiovascular Disease

## 2018-06-23 ENCOUNTER — Encounter (HOSPITAL_COMMUNITY)
Admission: RE | Admit: 2018-06-23 | Discharge: 2018-06-23 | Disposition: A | Discharge status: Home / Self Care | Payer: Medicare Other | Source: Ambulatory Visit | Attending: Cardiovascular Disease | Admitting: Cardiovascular Disease

## 2018-06-25 ENCOUNTER — Encounter (HOSPITAL_COMMUNITY)
Admission: RE | Admit: 2018-06-25 | Discharge: 2018-06-25 | Disposition: A | Discharge status: Home / Self Care | Payer: Medicare Other | Source: Ambulatory Visit | Attending: Cardiovascular Disease | Admitting: Cardiovascular Disease

## 2018-06-27 ENCOUNTER — Encounter (HOSPITAL_COMMUNITY)
Admission: RE | Admit: 2018-06-27 | Discharge: 2018-06-27 | Disposition: A | Discharge status: Home / Self Care | Payer: Medicare Other | Source: Ambulatory Visit | Attending: Cardiovascular Disease | Admitting: Cardiovascular Disease

## 2018-06-30 ENCOUNTER — Encounter (HOSPITAL_COMMUNITY): Payer: Self-pay

## 2018-07-02 ENCOUNTER — Encounter (HOSPITAL_COMMUNITY): Payer: Self-pay

## 2018-07-04 ENCOUNTER — Encounter (HOSPITAL_COMMUNITY): Payer: Self-pay

## 2018-07-07 ENCOUNTER — Encounter (HOSPITAL_COMMUNITY)
Admission: RE | Admit: 2018-07-07 | Discharge: 2018-07-07 | Disposition: A | Discharge status: Home / Self Care | Payer: Medicare Other | Source: Ambulatory Visit | Attending: Cardiovascular Disease | Admitting: Cardiovascular Disease

## 2018-07-09 ENCOUNTER — Encounter (HOSPITAL_COMMUNITY)
Admission: RE | Admit: 2018-07-09 | Discharge: 2018-07-09 | Disposition: A | Discharge status: Home / Self Care | Payer: Medicare Other | Source: Ambulatory Visit | Attending: Cardiovascular Disease | Admitting: Cardiovascular Disease

## 2018-07-11 ENCOUNTER — Encounter (HOSPITAL_COMMUNITY)
Admission: RE | Admit: 2018-07-11 | Discharge: 2018-07-11 | Disposition: A | Discharge status: Home / Self Care | Payer: Medicare Other | Source: Ambulatory Visit | Attending: Cardiovascular Disease | Admitting: Cardiovascular Disease

## 2018-07-16 ENCOUNTER — Encounter (HOSPITAL_COMMUNITY)
Admission: RE | Admit: 2018-07-16 | Discharge: 2018-07-16 | Disposition: A | Discharge status: Home / Self Care | Payer: Medicare Other | Source: Ambulatory Visit | Attending: Cardiovascular Disease | Admitting: Cardiovascular Disease

## 2018-07-16 DIAGNOSIS — I2510 Atherosclerotic heart disease of native coronary artery without angina pectoris: Secondary | ICD-10-CM | POA: Insufficient documentation

## 2018-07-18 ENCOUNTER — Telehealth: Payer: Self-pay

## 2018-07-18 ENCOUNTER — Encounter (HOSPITAL_COMMUNITY)
Admission: RE | Admit: 2018-07-18 | Discharge: 2018-07-18 | Disposition: A | Discharge status: Home / Self Care | Payer: Medicare Other | Source: Ambulatory Visit | Attending: Cardiovascular Disease | Admitting: Cardiovascular Disease

## 2018-07-18 NOTE — Telephone Encounter (Signed)
   Primary Cardiologist: Verne Carrow, MD  Chart reviewed as part of pre-operative protocol coverage. Hx of CAD s/p Successful PTCA/atherectomy/DES placement mid to distal RCA 09/2016.  Patient was contacted 07/18/2018 in reference to pre-operative risk assessment for pending surgery as outlined below.  Gaddis Koser was last seen on 01/22/18 by Dr. Clifton James.  Since that day, Red Holiday has done well. He is easily getting > 4 mets for activity per [facility].   Therefore, based on ACC/AHA guidelines, the patient would be at acceptable risk for the planned procedure without further cardiovascular testing.   Per patient, surgeon has advice to hold ASA and Plavix for 7 days prior to surgery. Dr. Clifton James please provide you recommendation for DAPT.   Please forward your response to P [facility] PREOP.   Thank you  Manson Passey, PA 07/18/2018, 12:52 PM

## 2018-07-18 NOTE — Telephone Encounter (Signed)
   [facility] Pre-operative Risk Assessment    Request for surgical clearance:  1. What type of surgery is being performed? Left Hip Replacement   2. When is this surgery scheduled?  08/05/18   3. What type of clearance is required (medical clearance vs. Pharmacy clearance to hold med vs. Both)? medical  4. Are there any medications that need to be held prior to surgery and how long?   5. Practice name and name of physician performing surgery?  [facility]   6. What is your office phone number [phone]    7.   What is your office fax number [phone]  8.   Anesthesia type (None, local, MAC, general) ?  spinal   Sean Schultz 07/18/2018, 11:41 AM  _________________________________________________________________   (provider comments below)

## 2018-07-18 NOTE — Progress Notes (Signed)
Please place orders in Epic as patient is being scheduled for a pre-op appointment! Thank you! 

## 2018-07-21 ENCOUNTER — Encounter (HOSPITAL_COMMUNITY)
Admission: RE | Admit: 2018-07-21 | Discharge: 2018-07-21 | Disposition: A | Discharge status: Home / Self Care | Payer: Self-pay | Source: Ambulatory Visit | Attending: Cardiovascular Disease | Admitting: Cardiovascular Disease

## 2018-07-22 NOTE — Telephone Encounter (Signed)
   Primary Cardiologist: Verne Carrow, MD  Chart reviewed as part of pre-operative protocol coverage. Given past medical history and time since last visit, based on ACC/AHA guidelines, Suvir Goertz would be at acceptable risk for the planned procedure without further cardiovascular testing.   Ok to hold aspirin and Plavix 7 days pre op if needed.  I will route this recommendation to the requesting party via Epic fax function and remove from pre-op pool.  Please call with questions.  Corine Shelter, PA-C 07/22/2018, 2:33 PM

## 2018-07-22 NOTE — Telephone Encounter (Signed)
OK to hold ASA and Plavix 7 days before his procedure.   Sean Schultz

## 2018-07-23 ENCOUNTER — Encounter (HOSPITAL_COMMUNITY)
Admission: RE | Admit: 2018-07-23 | Discharge: 2018-07-23 | Disposition: A | Discharge status: Home / Self Care | Payer: Medicare Other | Source: Ambulatory Visit | Attending: Cardiovascular Disease | Admitting: Cardiovascular Disease

## 2018-07-23 ENCOUNTER — Other Ambulatory Visit (HOSPITAL_COMMUNITY): Payer: Self-pay | Admitting: *Deleted

## 2018-07-23 NOTE — Patient Instructions (Addendum)
Sean Schultz  07/23/2018   Your procedure is scheduled on: 08-05-18   Report to [facility]  Report to admitting at 1150 AM    Call this number if you have problems the morning of surgery [phone]    Remember: Do not eat food:After Midnight. MAY HAVE CLEAR LIQUIDS FROM MIDNIGHT UNTIL 820 AM DAY OF SURGERY. NOTHING BY MOUTH AFTER 820 AM DAY OF SURGERY.     CLEAR LIQUID DIET   Foods Allowed                                                                     Foods Excluded  Coffee and tea, regular and decaf                             liquids that you cannot  Plain Jell-O in any flavor                                             see through such as: Fruit ices (not with fruit pulp)                                     milk, soups, orange juice  Iced Popsicles                                    All solid food Carbonated beverages, regular and diet                                    Cranberry, grape and apple juices Sports drinks like Gatorade Lightly seasoned clear broth or consume(fat free) Sugar, honey syrup  _____________________________________________________________________     Take these medicines the morning of surgery with A SIP OF WATER: METOPROLOL TARTRATE                                You may not have any metal on your body including hair pins and              piercings  Do not wear jewelry, , lotions, powders or perfumes, deodorant                 Men may shave face and neck.   Do not bring valuables to the hospital. [facility] IS NOT             RESPONSIBLE   FOR VALUABLES.  Contacts, dentures or bridgework may not be worn into surgery.  Leave suitcase in the car. After surgery it may be brought to your room.                  Please read over the following fact sheets you  were given: _____________________________________________________________________  [facility] - Preparing for Surgery Before surgery, you  can play an important role.  Because skin is not sterile, your skin needs to be as free of germs as possible.  You can reduce the number of germs on your skin by washing with CHG (chlorahexidine gluconate) soap before surgery.  CHG is an antiseptic cleaner which kills germs and bonds with the skin to continue killing germs even after washing. Please DO NOT use if you have an allergy to CHG or antibacterial soaps.  If your skin becomes reddened/irritated stop using the CHG and inform your nurse when you arrive at Short Stay. Do not shave (including legs and underarms) for at least 48 hours prior to the first CHG shower.  You may shave your face/neck. Please follow these instructions carefully:  1.  Shower with CHG Soap the night before surgery and the  morning of Surgery.  2.  If you choose to wash your hair, wash your hair first as usual with your  normal  shampoo.  3.  After you shampoo, rinse your hair and body thoroughly to remove the  shampoo.                           4.  Use CHG as you would any other liquid soap.  You can apply chg directly  to the skin and wash                       Gently with a scrungie or clean washcloth.  5.  Apply the CHG Soap to your body ONLY FROM THE NECK DOWN.   Do not use on face/ open                           Wound or open sores. Avoid contact with eyes, ears mouth and genitals (private parts).                       Wash face,  Genitals (private parts) with your normal soap.             6.  Wash thoroughly, paying special attention to the area where your surgery  will be performed.  7.  Thoroughly rinse your body with warm water from the neck down.  8.  DO NOT shower/wash with your normal soap after using and rinsing off  the CHG Soap.                9.  Pat yourself dry with a clean towel.            10.  Wear clean pajamas.            11.  Place clean sheets on your bed the night of your first shower and do not  sleep with pets. Day of Surgery : Do not apply  any lotions/deodorants the morning of surgery.  Please wear clean clothes to the hospital/surgery center.  FAILURE TO FOLLOW THESE INSTRUCTIONS MAY RESULT IN THE CANCELLATION OF YOUR SURGERY PATIENT SIGNATURE_________________________________  NURSE SIGNATURE__________________________________  ________________________________________________________________________   Sean Schultz  An incentive spirometer is a tool that can help keep your lungs clear and active. This tool measures how well you are filling your lungs with each breath. Taking long deep breaths may help reverse or decrease the chance of developing breathing (pulmonary) problems (especially infection) following:  A long period of time when you are unable to move or be active. BEFORE THE PROCEDURE   If the spirometer includes an indicator to show your best effort, your nurse or respiratory therapist will set it to a desired goal.  If possible, sit up straight or lean slightly forward. Try not to slouch.  Hold the incentive spirometer in an upright position. INSTRUCTIONS FOR USE  1. Sit on the edge of your bed if possible, or sit up as far as you can in bed or on a chair. 2. Hold the incentive spirometer in an upright position. 3. Breathe out normally. 4. Place the mouthpiece in your mouth and seal your lips tightly around it. 5. Breathe in slowly and as deeply as possible, raising the piston or the ball toward the top of the column. 6. Hold your breath for 3-5 seconds or for as long as possible. Allow the piston or ball to fall to the bottom of the column. 7. Remove the mouthpiece from your mouth and breathe out normally. 8. Rest for a few seconds and repeat Steps 1 through 7 at least 10 times every 1-2 hours when you are awake. Take your time and take a few normal breaths between deep breaths. 9. The spirometer may include an indicator to show your best effort. Use the indicator as a goal to work toward during each  repetition. 10. After each set of 10 deep breaths, practice coughing to be sure your lungs are clear. If you have an incision (the cut made at the time of surgery), support your incision when coughing by placing a pillow or rolled up towels firmly against it. Once you are able to get out of bed, walk around indoors and cough well. You may stop using the incentive spirometer when instructed by your caregiver.  RISKS AND COMPLICATIONS  Take your time so you do not get dizzy or light-headed.  If you are in pain, you may need to take or ask for pain medication before doing incentive spirometry. It is harder to take a deep breath if you are having pain. AFTER USE  Rest and breathe slowly and easily.  It can be helpful to keep track of a log of your progress. Your caregiver can provide you with a simple table to help with this. If you are using the spirometer at home, follow these instructions: SEEK MEDICAL CARE IF:   You are having difficultly using the spirometer.  You have trouble using the spirometer as often as instructed.  Your pain medication is not giving enough relief while using the spirometer.  You develop fever of 100.5 F (38.1 C) or higher. SEEK IMMEDIATE MEDICAL CARE IF:   You cough up bloody sputum that had not been present before.  You develop fever of 102 F (38.9 C) or greater.  You develop worsening pain at or near the incision site. MAKE SURE YOU:   Understand these instructions.  Will watch your condition.  Will get help right away if you are not doing well or get worse. Document Released: 03/11/2007 Document Revised: 01/21/2012 Document Reviewed: 05/12/2007 Exit[employer] Patient Information 2014 Exit[employer], [place].   ________________________________________________________________________  WHAT IS A BLOOD TRANSFUSION? Blood Transfusion Information  A transfusion is the replacement of blood or some of its parts. Blood is made up of multiple cells which provide  different functions.  Red blood cells carry oxygen and are used for blood loss replacement.  White blood cells fight against infection.  Platelets control bleeding.  Plasma helps clot blood.  Other blood products are available for specialized needs, such as hemophilia or other clotting disorders. BEFORE THE TRANSFUSION  Who gives blood for transfusions?   Healthy volunteers who are fully evaluated to make sure their blood is safe. This is blood bank blood. Transfusion therapy is the safest it has ever been in the practice of medicine. Before blood is taken from a donor, a complete history is taken to make sure that person has no history of diseases nor engages in risky social behavior (examples are intravenous drug use or sexual activity with multiple partners). The donor's travel history is screened to minimize risk of transmitting infections, such as malaria. The donated blood is tested for signs of infectious diseases, such as HIV and hepatitis. The blood is then tested to be sure it is compatible with you in order to minimize the chance of a transfusion reaction. If you or a relative donates blood, this is often done in anticipation of surgery and is not appropriate for emergency situations. It takes many days to process the donated blood. RISKS AND COMPLICATIONS Although transfusion therapy is very safe and saves many lives, the main dangers of transfusion include:   Getting an infectious disease.  Developing a transfusion reaction. This is an allergic reaction to something in the blood you were given. Every precaution is taken to prevent this. The decision to have a blood transfusion has been considered carefully by your caregiver before blood is given. Blood is not given unless the benefits outweigh the risks. AFTER THE TRANSFUSION  Right after receiving a blood transfusion, you will usually feel much better and more energetic. This is especially true if your red blood cells have  gotten low (anemic). The transfusion raises the level of the red blood cells which carry oxygen, and this usually causes an energy increase.  The nurse administering the transfusion will monitor you carefully for complications. HOME CARE INSTRUCTIONS  No special instructions are needed after a transfusion. You may find your energy is better. Speak with your caregiver about any limitations on activity for underlying diseases you may have. SEEK MEDICAL CARE IF:   Your condition is not improving after your transfusion.  You develop redness or irritation at the intravenous (IV) site. SEEK IMMEDIATE MEDICAL CARE IF:  Any of the following symptoms occur over the next 12 hours:  Shaking chills.  You have a temperature by mouth above 102 F (38.9 C), not controlled by medicine.  Chest, back, or muscle pain.  People around you feel you are not acting correctly or are confused.  Shortness of breath or difficulty breathing.  Dizziness and fainting.  You get a rash or develop hives.  You have a decrease in urine output.  Your urine turns a dark color or changes to pink, red, or brown. Any of the following symptoms occur over the next 10 days:  You have a temperature by mouth above 102 F (38.9 C), not controlled by medicine.  Shortness of breath.  Weakness after normal activity.  The white part of the eye turns yellow (jaundice).  You have a decrease in the amount of urine or are urinating less often.  Your urine turns a dark color or changes to pink, red, or brown. Document Released: 10/26/2000 Document Revised: 01/21/2012 Document Reviewed: 06/14/2008 [facility] Patient Information 2014 [place], [place].  _______________________________________________________________________

## 2018-07-23 NOTE — Progress Notes (Signed)
CARDIAC CLEARANCE NOTE LUKE KILROY PA 07-22-18 Epic EKG 08-15-17 Epic ECHO 05-20-17 Epic STRESS TEST 09-14-16 EPIC

## 2018-07-25 ENCOUNTER — Encounter (HOSPITAL_COMMUNITY)
Admission: RE | Admit: 2018-07-25 | Discharge: 2018-07-25 | Disposition: A | Discharge status: Home / Self Care | Payer: Medicare Other | Source: Ambulatory Visit | Attending: Cardiovascular Disease | Admitting: Cardiovascular Disease

## 2018-07-25 NOTE — Telephone Encounter (Signed)
[facility] with [facility] as provider performing procedure has never received fax back. Printed this encounter and faxed to number provided on form.

## 2018-07-25 NOTE — H&P (Signed)
TOTAL HIP ADMISSION H&P  Patient is admitted for left total hip arthroplasty, anterior approach.  Subjective:  Chief Complaint:   Left hip primary OA / pain  HPI: Sean Schultz, 67 y.o. male, has a history of pain and functional disability in the left hip(s) due to arthritis and patient has failed non-surgical conservative treatments for greater than 12 weeks to include NSAID's and/or analgesics, corticosteriod injections, use of assistive devices and activity modification.  Onset of symptoms was abrupt starting <1 years ago with rapidlly worsening course since that time.The patient noted prior procedures of the hip to include arthroplasty on the right hip(s).  Patient currently rates pain in the left hip at 9 out of 10 with activity. Patient has worsening of pain with activity and weight bearing, trendelenberg gait, pain that interfers with activities of daily living and pain with passive range of motion. Patient has evidence of periarticular osteophytes and joint space narrowing by imaging studies. This condition presents safety issues increasing the risk of falls.  There is no current active infection.  Risks, benefits and expectations were discussed with the patient.  Risks including but not limited to the risk of anesthesia, blood clots, nerve damage, blood vessel damage, failure of the prosthesis, infection and up to and including death.  Patient understand the risks, benefits and expectations and wishes to proceed with surgery.   PCP: Titus DubinFutrell, Thomas M., MD  D/C Plans:       Home  Post-op Meds:       No Rx given   Tranexamic Acid:      To be given - IV   Decadron:      Is to be given  FYI:     Plavix & ASA  Norco  DME:   Pt already has equipment  PT:   No PT     Patient Active Problem List   Diagnosis Date Noted  . CAD in native artery 09/21/2016  . Abnormal stress test   . Coronary artery disease involving native coronary artery of native heart   . Appendicitis 07/14/2016    Past Medical History:  Diagnosis Date  . BPH (benign prostatic hypertrophy)   . Coronary artery disease   . GERD (gastroesophageal reflux disease)   . H/O seasonal allergies   . Hyperlipemia   . Hypertension   . Prostatitis     Past Surgical History:  Procedure Laterality Date  . CARDIAC CATHETERIZATION N/A 09/21/2016   Procedure: Left Heart Cath and Coronary Angiography;  Surgeon: Kathleene Hazelhristopher D McAlhany, MD;  [facility];  Service: Cardiovascular;  Laterality: N/A;  . CARDIAC CATHETERIZATION N/A 09/24/2016   Procedure: Coronary Stent Intervention;  Surgeon: Kathleene Hazelhristopher D McAlhany, MD;  Location: [facility] INVASIVE CV LAB;  Service: Cardiovascular;  Laterality: N/A;  . CARDIAC CATHETERIZATION N/A 09/24/2016   Procedure: Coronary/Graft Atherectomy;  Surgeon: Kathleene Hazelhristopher D McAlhany, MD;  Location: [facility] INVASIVE CV LAB;  Service: Cardiovascular;  Laterality: N/A;  . CARDIAC CATHETERIZATION N/A 09/24/2016   Procedure: Temporary Pacemaker;  Surgeon: Kathleene Hazelhristopher D McAlhany, MD;  Location: [facility] INVASIVE CV LAB;  Service: Cardiovascular;  Laterality: N/A;  . CARDIOVASCULAR STRESS TEST  09/14/2016  . COLONOSCOPY    . CORONARY STENT PLACEMENT    . LAPAROSCOPIC APPENDECTOMY N/A 07/14/2016   Procedure: APPENDECTOMY LAPAROSCOPIC;  Surgeon: Ovidio Kinavid Newman, MD;  Location: [facility] ORS;  Service: General;  Laterality: N/A;  . TOTAL HIP ARTHROPLASTY    . WISDOM TOOTH EXTRACTION      No current facility-administered medications  for this encounter.    Current Outpatient Medications  Medication Sig Dispense Refill Last Dose  . aspirin EC 81 MG tablet Take 81 mg by mouth every evening.    Taking  . calcium carbonate (TUMS - DOSED IN MG ELEMENTAL CALCIUM) 500 MG chewable tablet Chew 1 tablet by mouth daily as needed for heartburn.    Taking  . cetirizine (ZYRTEC) 10 MG tablet Take 10 mg by mouth daily as needed for allergies.     [name] clopidogrel (PLAVIX) 75 MG tablet Take 1 tablet (75 mg total) daily by  mouth. 30 tablet 10 Taking  . Coenzyme Q10 (COQ-10) 100 MG CAPS Take 100 mg by mouth 2 (two) times daily.     . Cyanocobalamin (RA VITAMIN B-12 TR) 1000 MCG TBCR Take 1,000 mcg by mouth daily.    Taking  . diphenhydrAMINE (BENADRYL) 25 MG tablet Take 25 mg by mouth daily as needed for allergies.     [name] esomeprazole (NEXIUM) 20 MG capsule Take 20 mg by mouth at bedtime.    Taking  . fenofibrate 160 MG tablet Take 160 mg by mouth daily.  0 Taking  . fluticasone (FLONASE) 50 MCG/ACT nasal spray Place 1 spray into both nostrils daily as needed for allergies or rhinitis.     . folic acid (FOLVITE) 400 MCG tablet Take 400 mcg by mouth daily.     . irbesartan (AVAPRO) 150 MG tablet Take 1 tablet (150 mg total) by mouth daily. (Patient taking differently: Take 150 mg by mouth every evening. ) 90 tablet 3   . Ketotifen Fumarate (ALAWAY OP) Place 1 drop into both eyes daily as needed (allergies).     Boris Lown Oil 500 MG CAPS Take 500 mg by mouth daily.     . Melatonin 3 MG TABS Take 3 mg by mouth at bedtime as needed (sleep).     . metoprolol tartrate (LOPRESSOR) 25 MG tablet Take 1 tablet (25 mg total) by mouth 2 (two) times daily. 60 tablet 9 Taking  . montelukast (SINGULAIR) 10 MG tablet Take 10 mg by mouth daily as needed (allergies).   1 Taking  . niacin (SLO-NIACIN) 500 MG tablet Take 500 mg by mouth daily.     . nitroGLYCERIN (NITROSTAT) 0.4 MG SL tablet Place 1 tablet (0.4 mg total) under the tongue every 5 (five) minutes as needed. (Patient taking differently: Place 0.4 mg under the tongue every 5 (five) minutes as needed for chest pain. ) 25 tablet 5 Taking  . Plant Sterol Stanol-Pantethine (CHOLESTOFF COMPLETE) 300-100 MG CAPS Take 2 capsules by mouth every evening.    Taking  . psyllium (REGULOID) 0.52 g capsule Take 2.6 g by mouth every evening.     . rosuvastatin (CRESTOR) 5 MG tablet Take 5 mg by mouth every Monday, Wednesday, and Friday.    Taking  . pantoprazole (PROTONIX) 40 MG tablet Take 1  tablet (40 mg total) by mouth daily. (Patient not taking: Reported on 07/23/2018) 10 tablet 0 Not Taking at Unknown time   Allergies  Allergen Reactions  . Penicillins Swelling and Other (See Comments)    Dyspnea, hair loss-childhood allergy Has patient had a PCN reaction causing immediate rash, facial/tongue/throat swelling, SOB or lightheadedness with hypotension:unsure Has patient had a PCN reaction causing severe rash involving mucus membranes or skin necrosis:unsure Has patient had a PCN reaction that required hospitalization:unsure Has patient had a PCN reaction occurring within the last 10 years:No If all of the above answers are "NO",  then may proceed with Cephalosporin use.    . Rosuvastatin     In high doses caused levated LFT    Social History   Tobacco Use  . Smoking status: Former Smoker    Packs/day: 0.50    Years: 12.00    Pack years: 6.00    Types: Cigarettes    Last attempt to quit: 11/17/1978    Years since quitting: 39.7  . Smokeless tobacco: Never Used  Substance Use Topics  . Alcohol use: Yes    Alcohol/week: 1.0 standard drinks    Types: 1 Glasses of wine per week    Family History  Problem Relation Age of Onset  . Coronary artery disease Mother 25  . CVA Mother   . Heart attack Father 45  . Microcephaly Paternal Uncle   . Heart attack Paternal Uncle      Review of Systems  Constitutional: Negative.   HENT: Negative.   Eyes: Negative.   Respiratory: Negative.   Cardiovascular: Negative.   Gastrointestinal: Negative.   Genitourinary: Negative.   Musculoskeletal: Positive for joint pain.  Skin: Negative.   Neurological: Negative.   Endo/Heme/Allergies: Positive for environmental allergies.  Psychiatric/Behavioral: Negative.     Objective:  Physical Exam  Constitutional: He is oriented to person, place, and time. He appears well-developed.  HENT:  Head: Normocephalic.  Eyes: Pupils are equal, round, and reactive to light.  Neck: Neck  supple. No JVD present. No tracheal deviation present. No thyromegaly present.  Cardiovascular: Normal rate, regular rhythm and intact distal pulses.  Respiratory: Effort normal and breath sounds normal. No respiratory distress. He has no wheezes.  GI: Soft. There is no tenderness. There is no guarding.  Musculoskeletal:       Left hip: He exhibits decreased range of motion, decreased strength, tenderness and bony tenderness. He exhibits no swelling, no deformity and no laceration.  Lymphadenopathy:    He has no cervical adenopathy.  Neurological: He is alert and oriented to person, place, and time.  Skin: Skin is warm and dry.  Psychiatric: He has a normal mood and affect.      Labs:  Estimated body mass index is 27.69 kg/m as calculated from the following:   Height as of 01/22/18: 5\' 7"  (1.702 m).   Weight as of 01/22/18: 80.2 kg.   Imaging Review Plain radiographs demonstrate severe degenerative joint disease of the left hip(s). The bone quality appears to be good for age and reported activity level.    Preoperative templating of the joint replacement has been completed, documented, and submitted to the Operating Room personnel in order to optimize intra-operative equipment management.     Assessment/Plan:  End stage arthritis, left hip  The patient history, physical examination, clinical judgement of the provider and imaging studies are consistent with end stage degenerative joint disease of the left hip and total hip arthroplasty is deemed medically necessary. The treatment options including medical management, injection therapy, arthroscopy and arthroplasty were discussed at length. The risks and benefits of total hip arthroplasty were presented and reviewed. The risks due to aseptic loosening, infection, stiffness, dislocation/subluxation,  thromboembolic complications and other imponderables were discussed.  The patient acknowledged the explanation, agreed to proceed with  the plan and consent was signed. Patient is being admitted for inpatient treatment for surgery, pain control, PT, OT, prophylactic antibiotics, VTE prophylaxis, progressive ambulation and ADL's and discharge planning.The patient is planning to be discharged home.      Anastasio Auerbach Aneshia Jacquet   PA-C  07/25/2018, 12:14 PM

## 2018-07-28 ENCOUNTER — Encounter (HOSPITAL_COMMUNITY)
Admission: RE | Admit: 2018-07-28 | Discharge: 2018-07-28 | Disposition: A | Discharge status: Home / Self Care | Payer: Self-pay | Source: Ambulatory Visit | Attending: Cardiovascular Disease | Admitting: Cardiovascular Disease

## 2018-07-29 ENCOUNTER — Other Ambulatory Visit: Payer: Self-pay

## 2018-07-29 ENCOUNTER — Encounter (HOSPITAL_COMMUNITY): Payer: Self-pay

## 2018-07-29 ENCOUNTER — Encounter (HOSPITAL_COMMUNITY)
Admission: RE | Admit: 2018-07-29 | Discharge: 2018-07-29 | Disposition: A | Discharge status: Home / Self Care | Payer: Medicare Other | Source: Ambulatory Visit | Attending: Orthopedic Surgery | Admitting: Orthopedic Surgery

## 2018-07-29 DIAGNOSIS — Z01818 Encounter for other preprocedural examination: Secondary | ICD-10-CM | POA: Diagnosis present

## 2018-07-29 DIAGNOSIS — M1612 Unilateral primary osteoarthritis, left hip: Secondary | ICD-10-CM | POA: Diagnosis not present

## 2018-07-29 HISTORY — DX: Unspecified asthma, uncomplicated: J45.909

## 2018-07-29 HISTORY — DX: Unspecified osteoarthritis, unspecified site: M19.90

## 2018-07-29 LAB — BASIC METABOLIC PANEL
Anion gap: 8 (ref 5–15)
BUN: 22 mg/dL (ref 8–23)
CALCIUM: 10.1 mg/dL (ref 8.9–10.3)
CO2: 26 mmol/L (ref 22–32)
Chloride: 106 mmol/L (ref 98–111)
Creatinine, Ser: 1.53 mg/dL — ABNORMAL HIGH (ref 0.61–1.24)
GFR calc Af Amer: 52 mL/min — ABNORMAL LOW (ref >60)
GFR, EST NON AFRICAN AMERICAN: 45 mL/min — AB (ref >60)
GLUCOSE: 98 mg/dL (ref 70–99)
POTASSIUM: 5.8 mmol/L — AB (ref 3.5–5.1)
SODIUM: 140 mmol/L (ref 135–145)

## 2018-07-29 LAB — CBC
HEMATOCRIT: 39.8 % (ref 39.0–52.0)
HEMOGLOBIN: 13.2 g/dL (ref 13.0–17.0)
MCH: 31.1 pg (ref 26.0–34.0)
MCHC: 33.2 g/dL (ref 30.0–36.0)
MCV: 93.9 fL (ref 78.0–100.0)
Platelets: 263 10*3/uL (ref 150–400)
RBC: 4.24 MIL/uL (ref 4.22–5.81)
RDW: 13 % (ref 11.5–15.5)
WBC: 4.4 10*3/uL (ref 4.0–10.5)

## 2018-07-29 LAB — SURGICAL PCR SCREEN
MRSA, PCR: NEGATIVE
Staphylococcus aureus: NEGATIVE

## 2018-07-29 NOTE — Progress Notes (Signed)
Bmp done 07-29-18 routed to Dr. Charlann Boxerlin via epic

## 2018-07-30 ENCOUNTER — Other Ambulatory Visit: Payer: Self-pay | Admitting: Cardiovascular Disease

## 2018-07-30 ENCOUNTER — Encounter (HOSPITAL_COMMUNITY)
Admission: RE | Admit: 2018-07-30 | Discharge: 2018-07-30 | Disposition: A | Discharge status: Home / Self Care | Payer: Medicare Other | Source: Ambulatory Visit | Attending: Cardiovascular Disease | Admitting: Cardiovascular Disease

## 2018-07-31 ENCOUNTER — Telehealth: Payer: Self-pay | Admitting: Cardiovascular Disease

## 2018-07-31 NOTE — Telephone Encounter (Signed)
Pt called back following up on his Surg Clear.

## 2018-08-01 ENCOUNTER — Encounter (HOSPITAL_COMMUNITY): Payer: Self-pay

## 2018-08-04 ENCOUNTER — Encounter (HOSPITAL_COMMUNITY)
Admission: RE | Admit: 2018-08-04 | Discharge: 2018-08-04 | Disposition: A | Discharge status: Home / Self Care | Payer: Medicare Other | Source: Ambulatory Visit | Attending: Cardiovascular Disease | Admitting: Cardiovascular Disease

## 2018-08-04 MED ORDER — GENTAMICIN SULFATE 40 MG/ML IJ SOLN
400.0000 mg | INTRAVENOUS | Status: DC
  Administered 2018-08-05: 400 mg via INTRAVENOUS
  Filled 2018-08-04: qty 10

## 2018-08-05 ENCOUNTER — Encounter (HOSPITAL_COMMUNITY)
Admission: RE | Disposition: A | Discharge status: Home Health | Payer: Self-pay | Source: Ambulatory Visit | Attending: Orthopedic Surgery

## 2018-08-05 ENCOUNTER — Encounter (HOSPITAL_COMMUNITY): Payer: Self-pay | Admitting: Emergency Medicine

## 2018-08-05 ENCOUNTER — Other Ambulatory Visit: Payer: Self-pay

## 2018-08-05 ENCOUNTER — Other Ambulatory Visit: Payer: Self-pay | Admitting: Orthopedic Surgery

## 2018-08-05 ENCOUNTER — Inpatient Hospital Stay (HOSPITAL_COMMUNITY)
Admission: RE | Admit: 2018-08-05 | Discharge: 2018-08-06 | DRG: 470 | Disposition: A | Discharge status: Home Health | Payer: Medicare Other | Source: Ambulatory Visit | Attending: Orthopedic Surgery | Admitting: Orthopedic Surgery

## 2018-08-05 ENCOUNTER — Inpatient Hospital Stay (HOSPITAL_COMMUNITY): Payer: Medicare Other | Admitting: Anesthesiology

## 2018-08-05 ENCOUNTER — Inpatient Hospital Stay (HOSPITAL_COMMUNITY): Payer: Medicare Other

## 2018-08-05 DIAGNOSIS — N400 Enlarged prostate without lower urinary tract symptoms: Secondary | ICD-10-CM | POA: Diagnosis present

## 2018-08-05 DIAGNOSIS — M1990 Unspecified osteoarthritis, unspecified site: Secondary | ICD-10-CM | POA: Diagnosis present

## 2018-08-05 DIAGNOSIS — Z7951 Long term (current) use of inhaled steroids: Secondary | ICD-10-CM | POA: Diagnosis not present

## 2018-08-05 DIAGNOSIS — Z823 Family history of stroke: Secondary | ICD-10-CM

## 2018-08-05 DIAGNOSIS — M1612 Unilateral primary osteoarthritis, left hip: Secondary | ICD-10-CM | POA: Diagnosis present

## 2018-08-05 DIAGNOSIS — Z880 Allergy status to penicillin: Secondary | ICD-10-CM

## 2018-08-05 DIAGNOSIS — E663 Overweight: Secondary | ICD-10-CM | POA: Diagnosis present

## 2018-08-05 DIAGNOSIS — Z96649 Presence of unspecified artificial hip joint: Secondary | ICD-10-CM

## 2018-08-05 DIAGNOSIS — I2510 Atherosclerotic heart disease of native coronary artery without angina pectoris: Secondary | ICD-10-CM | POA: Diagnosis present

## 2018-08-05 DIAGNOSIS — K219 Gastro-esophageal reflux disease without esophagitis: Secondary | ICD-10-CM | POA: Diagnosis present

## 2018-08-05 DIAGNOSIS — Z6825 Body mass index (BMI) 25.0-25.9, adult: Secondary | ICD-10-CM | POA: Diagnosis not present

## 2018-08-05 DIAGNOSIS — Z955 Presence of coronary angioplasty implant and graft: Secondary | ICD-10-CM

## 2018-08-05 DIAGNOSIS — Z87891 Personal history of nicotine dependence: Secondary | ICD-10-CM

## 2018-08-05 DIAGNOSIS — I10 Essential (primary) hypertension: Secondary | ICD-10-CM | POA: Diagnosis present

## 2018-08-05 DIAGNOSIS — Z7902 Long term (current) use of antithrombotics/antiplatelets: Secondary | ICD-10-CM | POA: Diagnosis not present

## 2018-08-05 DIAGNOSIS — Z8249 Family history of ischemic heart disease and other diseases of the circulatory system: Secondary | ICD-10-CM | POA: Diagnosis not present

## 2018-08-05 DIAGNOSIS — Z7982 Long term (current) use of aspirin: Secondary | ICD-10-CM | POA: Diagnosis not present

## 2018-08-05 DIAGNOSIS — Z888 Allergy status to other drugs, medicaments and biological substances status: Secondary | ICD-10-CM

## 2018-08-05 DIAGNOSIS — M25552 Pain in left hip: Secondary | ICD-10-CM | POA: Diagnosis present

## 2018-08-05 DIAGNOSIS — M879 Osteonecrosis, unspecified: Principal | ICD-10-CM | POA: Diagnosis present

## 2018-08-05 DIAGNOSIS — Z96642 Presence of left artificial hip joint: Secondary | ICD-10-CM

## 2018-08-05 DIAGNOSIS — E785 Hyperlipidemia, unspecified: Secondary | ICD-10-CM | POA: Diagnosis present

## 2018-08-05 HISTORY — PX: TOTAL HIP ARTHROPLASTY: SHX124

## 2018-08-05 LAB — TYPE AND SCREEN
ABO/RH(D): O POS
ANTIBODY SCREEN: NEGATIVE

## 2018-08-05 SURGERY — ARTHROPLASTY, HIP, TOTAL, ANTERIOR APPROACH
Anesthesia: Spinal | Site: Hip | Laterality: Left

## 2018-08-05 MED ORDER — PROPOFOL 10 MG/ML IV BOLUS
INTRAVENOUS | Status: AC
  Filled 2018-08-05: qty 60

## 2018-08-05 MED ORDER — PHENOL 1.4 % MT LIQD
1.0000 | OROMUCOSAL | Status: DC | PRN
  Filled 2018-08-05: qty 177

## 2018-08-05 MED ORDER — POLYETHYLENE GLYCOL 3350 17 G PO PACK
17.0000 g | PACK | Freq: Two times a day (BID) | ORAL | Status: DC
  Administered 2018-08-06: 17 g via ORAL
  Filled 2018-08-05: qty 1

## 2018-08-05 MED ORDER — ACETAMINOPHEN 325 MG PO TABS: 325.0000 mg | ORAL_TABLET | Freq: Four times a day (QID) | ORAL | Status: DC | PRN

## 2018-08-05 MED ORDER — MIDAZOLAM HCL 5 MG/5ML IJ SOLN
INTRAMUSCULAR | Status: DC | PRN
  Administered 2018-08-05: 2 mg via INTRAVENOUS

## 2018-08-05 MED ORDER — MONTELUKAST SODIUM 10 MG PO TABS: 10.0000 mg | ORAL_TABLET | Freq: Every day | ORAL | Status: DC | PRN

## 2018-08-05 MED ORDER — LORATADINE 10 MG PO TABS
10.0000 mg | ORAL_TABLET | Freq: Every day | ORAL | Status: DC
  Administered 2018-08-05 – 2018-08-06 (×2): 10 mg via ORAL
  Filled 2018-08-05 (×2): qty 1

## 2018-08-05 MED ORDER — DIPHENHYDRAMINE HCL 12.5 MG/5ML PO ELIX: 12.5000 mg | ORAL_SOLUTION | ORAL | Status: DC | PRN

## 2018-08-05 MED ORDER — IRBESARTAN 150 MG PO TABS: 150.0000 mg | ORAL_TABLET | Freq: Every evening | ORAL | Status: DC

## 2018-08-05 MED ORDER — MIDAZOLAM HCL 2 MG/2ML IJ SOLN
INTRAMUSCULAR | Status: AC
  Filled 2018-08-05: qty 2

## 2018-08-05 MED ORDER — DOCUSATE SODIUM 100 MG PO CAPS: 100.0000 mg | ORAL_CAPSULE | Freq: Two times a day (BID) | ORAL | 0 refills | Status: DC

## 2018-08-05 MED ORDER — PHENYLEPHRINE 40 MCG/ML (10ML) SYRINGE FOR IV PUSH (FOR BLOOD PRESSURE SUPPORT)
PREFILLED_SYRINGE | INTRAVENOUS | Status: AC
  Filled 2018-08-05: qty 10

## 2018-08-05 MED ORDER — HYDROCODONE-ACETAMINOPHEN 5-325 MG PO TABS
1.0000 | ORAL_TABLET | ORAL | Status: DC | PRN
  Administered 2018-08-05: 2 via ORAL
  Administered 2018-08-05: 1 via ORAL
  Administered 2018-08-06: 2 via ORAL
  Administered 2018-08-06: 1 via ORAL
  Filled 2018-08-05: qty 2
  Filled 2018-08-05 (×2): qty 1
  Filled 2018-08-05: qty 2

## 2018-08-05 MED ORDER — ONDANSETRON HCL 4 MG/2ML IJ SOLN: 4.0000 mg | Freq: Four times a day (QID) | INTRAMUSCULAR | Status: DC | PRN

## 2018-08-05 MED ORDER — FENTANYL CITRATE (PF) 100 MCG/2ML IJ SOLN: 25.0000 ug | INTRAMUSCULAR | Status: DC | PRN

## 2018-08-05 MED ORDER — DOCUSATE SODIUM 100 MG PO CAPS
100.0000 mg | ORAL_CAPSULE | Freq: Two times a day (BID) | ORAL | Status: DC
  Administered 2018-08-05 – 2018-08-06 (×2): 100 mg via ORAL
  Filled 2018-08-05 (×2): qty 1

## 2018-08-05 MED ORDER — FERROUS SULFATE 325 (65 FE) MG PO TABS
325.0000 mg | ORAL_TABLET | Freq: Three times a day (TID) | ORAL | Status: DC
  Administered 2018-08-05 – 2018-08-06 (×3): 325 mg via ORAL
  Filled 2018-08-05 (×3): qty 1

## 2018-08-05 MED ORDER — ONDANSETRON HCL 4 MG/2ML IJ SOLN
INTRAMUSCULAR | Status: DC | PRN
  Administered 2018-08-05: 4 mg via INTRAVENOUS

## 2018-08-05 MED ORDER — FENTANYL CITRATE (PF) 250 MCG/5ML IJ SOLN
INTRAMUSCULAR | Status: DC | PRN
  Administered 2018-08-05: 50 ug via INTRAVENOUS
  Administered 2018-08-05 (×2): 25 ug via INTRAVENOUS

## 2018-08-05 MED ORDER — ALUM & MAG HYDROXIDE-SIMETH 200-200-20 MG/5ML PO SUSP: 15.0000 mL | ORAL | Status: DC | PRN

## 2018-08-05 MED ORDER — ONDANSETRON HCL 4 MG PO TABS: 4.0000 mg | ORAL_TABLET | Freq: Four times a day (QID) | ORAL | Status: DC | PRN

## 2018-08-05 MED ORDER — MENTHOL 3 MG MT LOZG: 1.0000 | LOZENGE | OROMUCOSAL | Status: DC | PRN

## 2018-08-05 MED ORDER — EPHEDRINE SULFATE-NACL 50-0.9 MG/10ML-% IV SOSY
PREFILLED_SYRINGE | INTRAVENOUS | Status: DC | PRN
  Administered 2018-08-05: 5 mg via INTRAVENOUS
  Administered 2018-08-05 (×2): 10 mg via INTRAVENOUS

## 2018-08-05 MED ORDER — CHLORHEXIDINE GLUCONATE 4 % EX LIQD: 60.0000 mL | Freq: Once | CUTANEOUS | Status: DC

## 2018-08-05 MED ORDER — NITROGLYCERIN 0.4 MG SL SUBL: 0.4000 mg | SUBLINGUAL_TABLET | SUBLINGUAL | Status: DC | PRN

## 2018-08-05 MED ORDER — METOCLOPRAMIDE HCL 5 MG PO TABS: 5.0000 mg | ORAL_TABLET | Freq: Three times a day (TID) | ORAL | Status: DC | PRN

## 2018-08-05 MED ORDER — METOPROLOL TARTRATE 25 MG PO TABS
25.0000 mg | ORAL_TABLET | Freq: Two times a day (BID) | ORAL | Status: DC
  Administered 2018-08-06: 25 mg via ORAL
  Filled 2018-08-05: qty 1

## 2018-08-05 MED ORDER — NON FORMULARY: 20.0000 mg | Freq: Every day | Status: DC

## 2018-08-05 MED ORDER — METHOCARBAMOL 500 MG PO TABS: 500.0000 mg | ORAL_TABLET | Freq: Four times a day (QID) | ORAL | 0 refills | Status: DC | PRN

## 2018-08-05 MED ORDER — EPHEDRINE 5 MG/ML INJ
INTRAVENOUS | Status: AC
  Filled 2018-08-05: qty 10

## 2018-08-05 MED ORDER — DEXAMETHASONE SODIUM PHOSPHATE 10 MG/ML IJ SOLN
10.0000 mg | Freq: Once | INTRAMUSCULAR | Status: AC
  Administered 2018-08-06: 10 mg via INTRAVENOUS
  Filled 2018-08-05: qty 1

## 2018-08-05 MED ORDER — NIACIN ER 500 MG PO TBCR
500.0000 mg | EXTENDED_RELEASE_TABLET | Freq: Every day | ORAL | Status: DC
  Administered 2018-08-05 – 2018-08-06 (×2): 500 mg via ORAL
  Filled 2018-08-05 (×2): qty 1

## 2018-08-05 MED ORDER — BISACODYL 10 MG RE SUPP: 10.0000 mg | Freq: Every day | RECTAL | Status: DC | PRN

## 2018-08-05 MED ORDER — STERILE WATER FOR IRRIGATION IR SOLN
Status: DC | PRN
  Administered 2018-08-05: 2000 mL

## 2018-08-05 MED ORDER — SODIUM CHLORIDE 0.9 % IV SOLN
INTRAVENOUS | Status: DC | PRN
  Administered 2018-08-05: 100 ug/min via INTRAVENOUS

## 2018-08-05 MED ORDER — PROPOFOL 500 MG/50ML IV EMUL
INTRAVENOUS | Status: DC | PRN
  Administered 2018-08-05: 25 ug/kg/min via INTRAVENOUS

## 2018-08-05 MED ORDER — LIDOCAINE 2% (20 MG/ML) 5 ML SYRINGE
INTRAMUSCULAR | Status: DC | PRN
  Administered 2018-08-05: 60 mg via INTRAVENOUS

## 2018-08-05 MED ORDER — FENOFIBRATE 160 MG PO TABS
160.0000 mg | ORAL_TABLET | Freq: Every day | ORAL | Status: DC
  Administered 2018-08-06: 160 mg via ORAL
  Filled 2018-08-05: qty 1

## 2018-08-05 MED ORDER — SODIUM CHLORIDE 0.9 % IR SOLN
Status: DC | PRN
  Administered 2018-08-05: 1000 mL

## 2018-08-05 MED ORDER — FLUTICASONE PROPIONATE 50 MCG/ACT NA SUSP
1.0000 | Freq: Every day | NASAL | Status: DC | PRN
  Filled 2018-08-05: qty 16

## 2018-08-05 MED ORDER — ESOMEPRAZOLE MAGNESIUM 20 MG PO CPDR
20.0000 mg | DELAYED_RELEASE_CAPSULE | Freq: Every day | ORAL | Status: DC
  Administered 2018-08-05: 20 mg via ORAL
  Filled 2018-08-05: qty 1

## 2018-08-05 MED ORDER — SODIUM CHLORIDE 0.9 % IV SOLN
INTRAVENOUS | Status: DC
  Administered 2018-08-06: 04:00:00 via INTRAVENOUS

## 2018-08-05 MED ORDER — METOCLOPRAMIDE HCL 5 MG/ML IJ SOLN: 5.0000 mg | Freq: Three times a day (TID) | INTRAMUSCULAR | Status: DC | PRN

## 2018-08-05 MED ORDER — FERROUS SULFATE 325 (65 FE) MG PO TABS: 325.0000 mg | ORAL_TABLET | Freq: Three times a day (TID) | ORAL | 3 refills | Status: DC

## 2018-08-05 MED ORDER — POLYETHYLENE GLYCOL 3350 17 G PO PACK: 17.0000 g | PACK | Freq: Two times a day (BID) | ORAL | 0 refills | Status: DC

## 2018-08-05 MED ORDER — FENTANYL CITRATE (PF) 100 MCG/2ML IJ SOLN
INTRAMUSCULAR | Status: AC
  Filled 2018-08-05: qty 2

## 2018-08-05 MED ORDER — HYDROCODONE-ACETAMINOPHEN 7.5-325 MG PO TABS: 1.0000 | ORAL_TABLET | ORAL | Status: DC | PRN

## 2018-08-05 MED ORDER — ROSUVASTATIN CALCIUM 5 MG PO TABS
5.0000 mg | ORAL_TABLET | ORAL | Status: DC
  Filled 2018-08-05: qty 1

## 2018-08-05 MED ORDER — CALCIUM CARBONATE ANTACID 500 MG PO CHEW: 1.0000 | CHEWABLE_TABLET | Freq: Every day | ORAL | Status: DC | PRN

## 2018-08-05 MED ORDER — MORPHINE SULFATE (PF) 2 MG/ML IV SOLN: 0.5000 mg | INTRAVENOUS | Status: DC | PRN

## 2018-08-05 MED ORDER — DEXAMETHASONE SODIUM PHOSPHATE 10 MG/ML IJ SOLN
10.0000 mg | Freq: Once | INTRAMUSCULAR | Status: AC
  Administered 2018-08-05: 10 mg via INTRAVENOUS

## 2018-08-05 MED ORDER — DEXAMETHASONE SODIUM PHOSPHATE 10 MG/ML IJ SOLN
INTRAMUSCULAR | Status: AC
  Filled 2018-08-05: qty 1

## 2018-08-05 MED ORDER — TRANEXAMIC ACID 1000 MG/10ML IV SOLN
1000.0000 mg | Freq: Once | INTRAVENOUS | Status: AC
  Administered 2018-08-05: 1000 mg via INTRAVENOUS
  Filled 2018-08-05: qty 1000

## 2018-08-05 MED ORDER — ASPIRIN EC 81 MG PO TBEC
81.0000 mg | DELAYED_RELEASE_TABLET | Freq: Every evening | ORAL | Status: DC
  Administered 2018-08-05: 81 mg via ORAL
  Filled 2018-08-05: qty 1

## 2018-08-05 MED ORDER — ONDANSETRON HCL 4 MG/2ML IJ SOLN
INTRAMUSCULAR | Status: AC
  Filled 2018-08-05: qty 2

## 2018-08-05 MED ORDER — CLOPIDOGREL BISULFATE 75 MG PO TABS
75.0000 mg | ORAL_TABLET | Freq: Every day | ORAL | Status: DC
  Administered 2018-08-06: 75 mg via ORAL
  Filled 2018-08-05: qty 1

## 2018-08-05 MED ORDER — VANCOMYCIN HCL IN DEXTROSE 1-5 GM/200ML-% IV SOLN
1000.0000 mg | INTRAVENOUS | Status: AC
  Administered 2018-08-05: 1000 mg via INTRAVENOUS
  Filled 2018-08-05: qty 200

## 2018-08-05 MED ORDER — HYDROCODONE-ACETAMINOPHEN 7.5-325 MG PO TABS: 1.0000 | ORAL_TABLET | ORAL | 0 refills | Status: DC | PRN

## 2018-08-05 MED ORDER — PHENYLEPHRINE 40 MCG/ML (10ML) SYRINGE FOR IV PUSH (FOR BLOOD PRESSURE SUPPORT)
PREFILLED_SYRINGE | INTRAVENOUS | Status: DC | PRN
  Administered 2018-08-05 (×2): 80 ug via INTRAVENOUS

## 2018-08-05 MED ORDER — DIPHENHYDRAMINE HCL 50 MG/ML IJ SOLN
INTRAMUSCULAR | Status: DC | PRN
  Administered 2018-08-05: 12.5 mg via INTRAVENOUS

## 2018-08-05 MED ORDER — METHOCARBAMOL 500 MG IVPB - SIMPLE MED
500.0000 mg | Freq: Four times a day (QID) | INTRAVENOUS | Status: DC | PRN
  Filled 2018-08-05: qty 50

## 2018-08-05 MED ORDER — ARTIFICIAL TEARS OP OINT
TOPICAL_OINTMENT | OPHTHALMIC | Status: AC
  Filled 2018-08-05: qty 3.5

## 2018-08-05 MED ORDER — CELECOXIB 200 MG PO CAPS
200.0000 mg | ORAL_CAPSULE | Freq: Two times a day (BID) | ORAL | Status: DC
  Administered 2018-08-05 – 2018-08-06 (×3): 200 mg via ORAL
  Filled 2018-08-05 (×3): qty 1

## 2018-08-05 MED ORDER — METHOCARBAMOL 500 MG PO TABS
500.0000 mg | ORAL_TABLET | Freq: Four times a day (QID) | ORAL | Status: DC | PRN
  Administered 2018-08-06 (×2): 500 mg via ORAL
  Filled 2018-08-05 (×2): qty 1

## 2018-08-05 MED ORDER — BUPIVACAINE IN DEXTROSE 0.75-8.25 % IT SOLN
INTRATHECAL | Status: DC | PRN
  Administered 2018-08-05: 2 mL via INTRATHECAL

## 2018-08-05 MED ORDER — MAGNESIUM CITRATE PO SOLN: 1.0000 | Freq: Once | ORAL | Status: DC | PRN

## 2018-08-05 MED ORDER — TRANEXAMIC ACID 1000 MG/10ML IV SOLN
1000.0000 mg | INTRAVENOUS | Status: AC
  Administered 2018-08-05: 1000 mg via INTRAVENOUS
  Filled 2018-08-05: qty 10

## 2018-08-05 MED ORDER — PROMETHAZINE HCL 25 MG/ML IJ SOLN: 6.2500 mg | INTRAMUSCULAR | Status: DC | PRN

## 2018-08-05 MED ORDER — VANCOMYCIN HCL IN DEXTROSE 1-5 GM/200ML-% IV SOLN
1000.0000 mg | Freq: Two times a day (BID) | INTRAVENOUS | Status: AC
  Administered 2018-08-05: 1000 mg via INTRAVENOUS
  Filled 2018-08-05: qty 200

## 2018-08-05 MED ORDER — MELATONIN 3 MG PO TABS
3.0000 mg | ORAL_TABLET | Freq: Every evening | ORAL | Status: DC | PRN
  Filled 2018-08-05: qty 1

## 2018-08-05 MED ORDER — LACTATED RINGERS IV SOLN
INTRAVENOUS | Status: DC
  Administered 2018-08-05: 1000 mL via INTRAVENOUS
  Administered 2018-08-05: 08:00:00 via INTRAVENOUS

## 2018-08-05 MED ORDER — LIDOCAINE 2% (20 MG/ML) 5 ML SYRINGE
INTRAMUSCULAR | Status: AC
  Filled 2018-08-05: qty 5

## 2018-08-05 SURGICAL SUPPLY — 47 items
BAG ZIPLOCK 12X15 (MISCELLANEOUS) ×3 IMPLANT
BLADE SAG 18X100X1.27 (BLADE) ×3 IMPLANT
COVER PERINEAL POST (MISCELLANEOUS) ×3 IMPLANT
COVER SURGICAL LIGHT HANDLE (MISCELLANEOUS) ×3 IMPLANT
CUP ACETBLR 54 OD PINNACLE (Hips) ×3 IMPLANT
DERMABOND ADVANCED (GAUZE/BANDAGES/DRESSINGS) ×2
DERMABOND ADVANCED .7 DNX12 (GAUZE/BANDAGES/DRESSINGS) ×1 IMPLANT
DRAPE STERI IOBAN 125X83 (DRAPES) ×3 IMPLANT
DRAPE U-SHAPE 47X51 STRL (DRAPES) ×6 IMPLANT
DRESSING AQUACEL AG SP 3.5X10 (GAUZE/BANDAGES/DRESSINGS) ×1 IMPLANT
DRSG AQUACEL AG SP 3.5X10 (GAUZE/BANDAGES/DRESSINGS) ×3
DURAPREP 26ML APPLICATOR (WOUND CARE) ×3 IMPLANT
ELECT REM PT RETURN 15FT ADLT (MISCELLANEOUS) ×3 IMPLANT
ELIMINATOR HOLE APEX DEPUY (Hips) ×3 IMPLANT
GLOVE BIOGEL PI IND STRL 6.5 (GLOVE) ×1 IMPLANT
GLOVE BIOGEL PI IND STRL 7.5 (GLOVE) ×4 IMPLANT
GLOVE BIOGEL PI IND STRL 8 (GLOVE) ×1 IMPLANT
GLOVE BIOGEL PI IND STRL 8.5 (GLOVE) ×1 IMPLANT
GLOVE BIOGEL PI INDICATOR 6.5 (GLOVE) ×2
GLOVE BIOGEL PI INDICATOR 7.5 (GLOVE) ×8
GLOVE BIOGEL PI INDICATOR 8 (GLOVE) ×2
GLOVE BIOGEL PI INDICATOR 8.5 (GLOVE) ×2
GLOVE ECLIPSE 7.5 STRL STRAW (GLOVE) ×3 IMPLANT
GLOVE ECLIPSE 8.0 STRL XLNG CF (GLOVE) ×6 IMPLANT
GLOVE ORTHO TXT STRL SZ7.5 (GLOVE) ×3 IMPLANT
GLOVE SURG SS PI 7.0 STRL IVOR (GLOVE) ×3 IMPLANT
GLOVE SURG SS PI 7.5 STRL IVOR (GLOVE) ×3 IMPLANT
GOWN SPEC L3 XXLG W/TWL (GOWN DISPOSABLE) ×6 IMPLANT
GOWN STRL REUS W/ TWL XL LVL3 (GOWN DISPOSABLE) ×1 IMPLANT
GOWN STRL REUS W/TWL 2XL LVL3 (GOWN DISPOSABLE) ×3 IMPLANT
GOWN STRL REUS W/TWL LRG LVL3 (GOWN DISPOSABLE) ×6 IMPLANT
GOWN STRL REUS W/TWL XL LVL3 (GOWN DISPOSABLE) ×2
HEAD CERAMIC DELTA 36 PLUS 1.5 (Hips) ×3 IMPLANT
HOLDER FOLEY CATH W/STRAP (MISCELLANEOUS) ×3 IMPLANT
LINER NEUTRAL 54X36MM PLUS 4 (Hips) ×3 IMPLANT
PACK ANTERIOR HIP CUSTOM (KITS) ×3 IMPLANT
SCREW 6.5MMX25MM (Screw) ×3 IMPLANT
STEM TRI LOC GRIPTON SZ 8 STD ×1 IMPLANT
SUT MNCRL AB 4-0 PS2 18 (SUTURE) ×3 IMPLANT
SUT STRATAFIX 0 PDS 27 VIOLET (SUTURE) ×3
SUT VIC AB 1 CT1 36 (SUTURE) ×9 IMPLANT
SUT VIC AB 2-0 CT1 27 (SUTURE) ×4
SUT VIC AB 2-0 CT1 TAPERPNT 27 (SUTURE) ×2 IMPLANT
SUTURE STRATFX 0 PDS 27 VIOLET (SUTURE) ×1 IMPLANT
TRAY FOLEY MTR SLVR 16FR STAT (SET/KITS/TRAYS/PACK) ×3 IMPLANT
TRI LOC GRIPTON SZ 8 STD ×3 IMPLANT
YANKAUER SUCT BULB TIP 10FT TU (MISCELLANEOUS) ×3 IMPLANT

## 2018-08-05 NOTE — Interval H&P Note (Signed)
History and Physical Interval Note:  08/05/2018 8:51 AM  Sean BertholdGrover H Valenza Jr.  has presented today for surgery, with the diagnosis of Left hip osteoarthritis  The various methods of treatment have been discussed with the patient and family. After consideration of risks, benefits and other options for treatment, the patient has consented to  Procedure(s) with comments: LEFT TOTAL HIP ARTHROPLASTY ANTERIOR APPROACH (Left) - 70 mins as a surgical intervention .  The patient's history has been reviewed, patient examined, no change in status, stable for surgery.  I have reviewed the patient's chart and labs.  Questions were answered to the patient's satisfaction.     Shelda PalMatthew D Markitta Ausburn

## 2018-08-05 NOTE — Anesthesia Procedure Notes (Signed)
Spinal  Patient location during procedure: OR Start time: 08/05/2018 10:06 AM End time: 08/05/2018 10:15 AM Staffing Anesthesiologist: Myrtie Soman, MD Performed: anesthesiologist  Preanesthetic Checklist Completed: patient identified, site marked, surgical consent, pre-op evaluation, timeout performed, IV checked, risks and benefits discussed and monitors and equipment checked Spinal Block Patient position: sitting Prep: ChloraPrep Patient monitoring: heart rate, continuous pulse ox and blood pressure Location: L3-4 Injection technique: single-shot Needle Needle type: Sprotte  Needle gauge: 24 G Needle length: 9 cm Additional Notes Expiration date of kit checked and confirmed. Patient tolerated procedure well, without complications.

## 2018-08-05 NOTE — Transfer of Care (Signed)
Immediate Anesthesia Transfer of Care Note  Patient: Sean BertholdGrover H Bauserman Jr.  Procedure(s) Performed: LEFT TOTAL HIP ARTHROPLASTY ANTERIOR APPROACH (Left Hip)  Patient Location: PACU  Anesthesia Type:Spinal  Level of Consciousness: awake, alert  and oriented  Airway & Oxygen Therapy: Patient Spontanous Breathing and Patient connected to face mask oxygen  Post-op Assessment: Report given to RN and Post -op Vital signs reviewed and stable  Post vital signs: Reviewed and stable  Last Vitals:  Vitals Value Taken Time  BP 94/63 08/05/2018 12:00 PM  Temp    Pulse 71 08/05/2018 12:02 PM  Resp 20 08/05/2018 12:01 PM  SpO2 99 % 08/05/2018 12:02 PM  Vitals shown include unvalidated device data.  Last Pain:  Vitals:   08/05/18 0753  TempSrc:   PainSc: 0-No pain      Patients Stated Pain Goal: 4 (08/05/18 0753)  Complications: No apparent anesthesia complications

## 2018-08-05 NOTE — Care Plan (Signed)
L THA scheduled on 08-05-18 DCP:  Home with spouse.  3 story home with 0 ste.  MBR is on the 1st floor.   DME:  No needs.   Has a RW and doesn't want a 3-in-1. PT:  HEP

## 2018-08-05 NOTE — Evaluation (Signed)
Physical Therapy Evaluation Patient Details Name: Sean Schultz. MRN: [id number] DOB: Jun 27, 1951 Today's Date: 08/05/2018   History of Present Illness  Pt is a 67 YO male s/p L DA-THA on 9/24. PMH includes BPH, CAD, GERD, HTN. Past surgical history includes cardiac cath 2017, coronary stent placement 2017, R THA 2012.  Clinical Impression    Pt s/p L DA-THA. Pt presents with increased time and effort to perform mobility tasks, mild L hip pain, and decreased tolerance for ambulation. Pt to benefit from acute PT to address deficits. Pt ambulated hallway distance with no increase in pain today, and tolerated it well. PT to progress mobility as able and will continue to follow acutely.     Follow Up Recommendations Follow surgeon's recommendation for DC plan and follow-up therapies;Supervision for mobility/OOB(HEP )    Equipment Recommendations  None recommended by PT    Recommendations for Other Services       Precautions / Restrictions Precautions Precautions: Fall Restrictions Weight Bearing Restrictions: No Other Position/Activity Restrictions: WBAT       Mobility  Bed Mobility Overal bed mobility: Needs Assistance Bed Mobility: Supine to Sit     Supine to sit: Min guard;HOB elevated     General bed mobility comments: Min guard for safety, increased time and effort to perform.   Transfers Overall transfer level: Needs assistance Equipment used: Rolling walker (2 wheeled) Transfers: Sit to/from Stand Sit to Stand: Min guard;From elevated surface         General transfer comment: Verbal cuing for hand placement, min guard for safety. Pt with self-steadying upon standing.   Ambulation/Gait Ambulation/Gait assistance: Min guard Gait Distance (Feet): 90 Feet Assistive device: Rolling walker (2 wheeled) Gait Pattern/deviations: Step-to pattern;Decreased stride length;Decreased stance time - left;Decreased weight shift to left;Antalgic Gait velocity: decr     General Gait Details: Min guard for safety. Verbal cuing for sequencing. Increased time to perform   Stairs            Wheelchair Mobility    Modified Rankin (Stroke Patients Only)       Balance Overall balance assessment: Mild deficits observed, not formally tested                                           Pertinent Vitals/Pain Pain Assessment: 0-10 Pain Score: 3  Pain Location: L hip  Pain Descriptors / Indicators: Sore;Operative site guarding Pain Intervention(s): Limited activity within patient's tolerance;Repositioned;Monitored during session    Home Living Family/patient expects to be discharged to:: Private residence Living Arrangements: Spouse/significant other Available Help at Discharge: Family;Available PRN/intermittently Type of Home: House Home Access: Level entry     Home Layout: Multi-level;Able to live on main level with bedroom/bathroom Home Equipment: Dan Humphreys - 2 wheels;Cane - single point      Prior Function Level of Independence: Independent with assistive device(s)         Comments: used cane for ambulation prior to admission      Hand Dominance   Dominant Hand: Right    Extremity/Trunk Assessment   Upper Extremity Assessment Upper Extremity Assessment: Overall WFL for tasks assessed    Lower Extremity Assessment Lower Extremity Assessment: Overall WFL for tasks assessed;LLE deficits/detail LLE Deficits / Details: Suspected post-surgical hip weakness; able to perform quad sets x5, ankle pumps  LLE Sensation: WNL    Cervical / Trunk Assessment Cervical / Trunk  Assessment: Normal  Communication   Communication: No difficulties  Cognition Arousal/Alertness: Awake/alert Behavior During Therapy: WFL for tasks assessed/performed Overall Cognitive Status: Within Functional Limits for tasks assessed                                        General Comments      Exercises Total Joint  Exercises Ankle Circles/Pumps: AROM;Both;10 reps;Seated Quad Sets: AROM;Left;5 reps;Supine   Assessment/Plan    PT Assessment Patient needs continued PT services  PT Problem List Decreased strength;Pain;Decreased range of motion;Decreased activity tolerance;Decreased knowledge of use of DME;Decreased balance;Decreased safety awareness;Decreased mobility       PT Treatment Interventions Therapeutic activities;Gait training;Therapeutic exercise;DME instruction;Patient/family education;Stair training;Balance training;Functional mobility training    PT Goals (Current goals can be found in the Care Plan section)  Acute Rehab PT Goals PT Goal Formulation: With patient Time For Goal Achievement: 08/19/18 Potential to Achieve Goals: Good    Frequency 7X/week   Barriers to discharge        Co-evaluation               AM-PAC PT "6 Clicks" Daily Activity  Outcome Measure Difficulty turning over in bed (including adjusting bedclothes, sheets and blankets)?: Unable Difficulty moving from lying on back to sitting on the side of the bed? : Unable Difficulty sitting down on and standing up from a chair with arms (e.g., wheelchair, bedside commode, etc,.)?: Unable Help needed moving to and from a bed to chair (including a wheelchair)?: None Help needed walking in hospital room?: None Help needed climbing 3-5 steps with a railing? : A Little 6 Click Score: 14    End of Session Equipment Utilized During Treatment: Gait belt Activity Tolerance: Patient tolerated treatment well Patient left: in chair;with chair alarm set;with call bell/phone within reach;with family/visitor present;with SCD's reapplied Nurse Communication: Mobility status PT Visit Diagnosis: Other abnormalities of gait and mobility (R26.89);Difficulty in walking, not elsewhere classified (R26.2)    Time: [id number] PT Time Calculation (min) (ACUTE ONLY): 24 min   Charges:   PT Evaluation $PT Eval Low Complexity: 1  Low PT Treatments $Gait Training: 8-22 mins        Nicola PoliceAlexa D Liylah Najarro, PT Acute Rehabilitation Services Pager [phone]  Office [phone]  Yun Gutierrez D Despina Hiddenure 08/05/2018, 7:29 PM

## 2018-08-05 NOTE — Anesthesia Preprocedure Evaluation (Signed)
Anesthesia Evaluation  Patient identified by MRN, date of birth, ID band Patient awake    Reviewed: Allergy & Precautions, NPO status , Patient's Chart, lab work & pertinent test results  Airway Mallampati: II  TM Distance: >3 FB Neck ROM: Full    Dental no notable dental hx.    Pulmonary neg pulmonary ROS, former smoker,    Pulmonary exam normal breath sounds clear to auscultation       Cardiovascular hypertension, Pt. on medications and Pt. on home beta blockers + CAD and + Cardiac Stents  Normal cardiovascular exam Rhythm:Regular Rate:Normal     Neuro/Psych negative neurological ROS  negative psych ROS   GI/Hepatic Neg liver ROS, GERD  Medicated,  Endo/Other  negative endocrine ROS  Renal/GU negative Renal ROS  negative genitourinary   Musculoskeletal negative musculoskeletal ROS (+)   Abdominal   Peds negative pediatric ROS (+)  Hematology negative hematology ROS (+)   Anesthesia Other Findings   Reproductive/Obstetrics negative OB ROS                             Anesthesia Physical Anesthesia Plan  ASA: III  Anesthesia Plan: Spinal   Post-op Pain Management:    Induction: Intravenous  PONV Risk Score and Plan: 0  Airway Management Planned: Simple Face Mask  Additional Equipment:   Intra-op Plan:   Post-operative Plan:   Informed Consent: I have reviewed the patients History and Physical, chart, labs and discussed the procedure including the risks, benefits and alternatives for the proposed anesthesia with the patient or authorized representative who has indicated his/her understanding and acceptance.   Dental advisory given  Plan Discussed with: CRNA and Surgeon  Anesthesia Plan Comments:         Anesthesia Quick Evaluation

## 2018-08-05 NOTE — Op Note (Signed)
NAME:  Sean Schultz.                ACCOUNT NO.: [id number]      MEDICAL RECORD NO.: [id number]      FACILITY:  [facility]      PHYSICIAN:  Shelda Pal  DATE OF BIRTH:  [birth year]     DATE OF PROCEDURE:  08/05/2018                                 OPERATIVE REPORT         PREOPERATIVE DIAGNOSIS: Left  hip avascular necrosis.      POSTOPERATIVE DIAGNOSIS:  Left hip avascular necrosis.      PROCEDURE:  Left total hip replacement through an anterior approach   utilizing DePuy THR system, component size 54mm pinnacle cup, a size 36+4 neutral   Altrex liner, a size 8 Standard Tri Lock stem with a 36+1.5 delta ceramic   ball.      SURGEON:  Madlyn Frankel. Charlann Boxer, M.D.      ASSISTANT:  Lanney Gins, PA-C     ANESTHESIA:  Spinal.      SPECIMENS:  None.      COMPLICATIONS:  None.      BLOOD LOSS:  300 cc     DRAINS:  None.      INDICATION OF THE PROCEDURE:  Cambren Helm. is a 67 y.o. male who had   presented to office for evaluation of left hip pain.  Radiographs revealed avascular changes to the femoral head with subchondral collapse and  progressive degenerative changes of the  hip joint, including subchondral cystic changes and osteophytes.  The patient had painful limited range of   motion significantly affecting their overall quality of life and function.  The patient was failing to    respond to conservative measures including medications and/or injections and activity modification and at this point was ready   to proceed with more definitive measures.  Consent was obtained for   benefit of pain relief.  Specific risks of infection, DVT, component   failure, dislocation, neurovascular injury, and need for revision surgery were reviewed in the office as well discussion of   the anterior versus posterior approach were reviewed.     PROCEDURE IN DETAIL:  The patient was brought to operative theater.   Once adequate anesthesia, preoperative  antibiotics, 1 gm of Vancomycin, weight based Gentamycin, 1 gm of Tranexamic Acid, and 10 mg of Decadron were administered, the patient was positioned supine on the [employer] table.  Once the patient was safely positioned with adequate padding of boney prominences we predraped out the hip, and used fluoroscopy to confirm orientation of the pelvis.      The left hip was then prepped and draped from proximal iliac crest to   mid thigh with a shower curtain technique.      Time-out was performed identifying the patient, planned procedure, and the appropriate extremity.     An incision was then made 2 cm lateral to the   anterior superior iliac spine extending over the orientation of the   tensor fascia lata muscle and sharp dissection was carried down to the   fascia of the muscle.      The fascia was then incised.  The muscle belly was identified and swept   laterally and retractor placed along the superior neck.  Following   cauterization of the circumflex vessels and removing some pericapsular   fat, a second cobra retractor was placed on the inferior neck.  A T-capsulotomy was made along the line of the   superior neck to the trochanteric fossa, then extended proximally and   distally.  Tag sutures were placed and the retractors were then placed   intracapsular.  We then identified the trochanteric fossa and   orientation of my neck cut and then made a neck osteotomy with the femur on traction.  The femoral   head was removed without difficulty or complication.  Traction was let   off and retractors were placed posterior and anterior around the   acetabulum.      The labrum and foveal tissue were debrided.  I began reaming with a 46 mm   reamer and reamed up to 53 mm reamer with good bony bed preparation and a 54 mm  cup was chosen.  The final 54 mm Pinnacle cup was then impacted under fluoroscopy to confirm the depth of penetration and orientation with respect to   Abduction and forward  flexion.  A screw was placed into the ilium followed by the hole eliminator.  The final   36+4 neutral Altrex liner was impacted with good visualized rim fit.  The cup was positioned anatomically within the acetabular portion of the pelvis.      At this point, the femur was rolled to 100 degrees.  Further capsule was   released off the inferior aspect of the femoral neck.  I then   released the superior capsule proximally.  With the leg in a neutral position the hook was placed laterally   along the femur under the vastus lateralis origin and elevated manually and then held in position using the hook attachment on the bed.  The leg was then extended and adducted with the leg rolled to 100   degrees of external rotation.  Retractors were placed along the medial calcar and posteriorly over the greater trochanter.  Once the proximal femur was fully   exposed, I used a box osteotome to set orientation.  I then began   broaching with the starting chili pepper broach and passed this by hand and then broached up to 8.  With the 8 broach in place I chose a standard offset neck (matching the other hip offset) and did several trial reductions.  The offset was appropriate, leg lengths   appeared to be equal best matched with the +1.5 head ball trial confirmed radiographically (again matching the other hip).   Given these findings, I went ahead and dislocated the hip, repositioned all   retractors and positioned the right hip in the extended and abducted position.  The final * standard Tri Lock stem was   chosen and it was impacted down to the level of neck cut.  Based on this   and the trial reductions, a final 36+1.5 delta ceramic ball was chosen and   impacted onto a clean and dry trunnion, and the hip was reduced.  The   hip had been irrigated throughout the case again at this point.  I did   reapproximate the superior capsular leaflet to the anterior leaflet   using #1 Vicryl.  The fascia of the    tensor fascia lata muscle was then reapproximated using #1 Vicryl and #0 Stratafix sutures.  The   remaining wound was closed with 2-0 Vicryl and running 4-0 Monocryl.   The hip  was cleaned, dried, and dressed sterilely using Dermabond and   Aquacel dressing.  The patient was then brought   to recovery room in stable condition tolerating the procedure well.    Lanney GinsMatthew Babish, PA-C was present for the entirety of the case involved from   preoperative positioning, perioperative retractor management, general   facilitation of the case, as well as primary wound closure as assistant.            Madlyn FrankelMatthew D. Charlann Boxerlin, M.D.        08/05/2018 11:36 AM

## 2018-08-05 NOTE — Anesthesia Postprocedure Evaluation (Signed)
Anesthesia Post Note  Patient: Sean BertholdGrover H Weld Jr.  Procedure(s) Performed: LEFT TOTAL HIP ARTHROPLASTY ANTERIOR APPROACH (Left Hip)     Patient location during evaluation: PACU Anesthesia Type: Spinal Level of consciousness: oriented and awake and alert Pain management: pain level controlled Vital Signs Assessment: post-procedure vital signs reviewed and stable Respiratory status: spontaneous breathing, respiratory function stable and patient connected to nasal cannula oxygen Cardiovascular status: blood pressure returned to baseline and stable Postop Assessment: no headache, no backache and no apparent nausea or vomiting Anesthetic complications: no    Last Vitals:  Vitals:   08/05/18 1315 08/05/18 1342  BP: 105/64 104/63  Pulse: 62 69  Resp: 16 (P) 16  Temp: 36.5 C (!) 36.3 C  SpO2: 99% 100%    Last Pain:  Vitals:   08/05/18 1342  TempSrc: Oral  PainSc: (P) 0-No pain                 Cynithia Hakimi S

## 2018-08-05 NOTE — Discharge Instructions (Signed)

## 2018-08-06 ENCOUNTER — Encounter (HOSPITAL_COMMUNITY): Payer: Self-pay | Admitting: Orthopedic Surgery

## 2018-08-06 ENCOUNTER — Encounter (HOSPITAL_COMMUNITY): Payer: Self-pay

## 2018-08-06 DIAGNOSIS — E663 Overweight: Secondary | ICD-10-CM | POA: Diagnosis present

## 2018-08-06 LAB — CBC
HCT: 32.7 % — ABNORMAL LOW (ref 39.0–52.0)
Hemoglobin: 10.8 g/dL — ABNORMAL LOW (ref 13.0–17.0)
MCH: 31.1 pg (ref 26.0–34.0)
MCHC: 33 g/dL (ref 30.0–36.0)
MCV: 94.2 fL (ref 78.0–100.0)
Platelets: 218 10*3/uL (ref 150–400)
RBC: 3.47 MIL/uL — ABNORMAL LOW (ref 4.22–5.81)
RDW: 12.8 % (ref 11.5–15.5)
WBC: 8.8 10*3/uL (ref 4.0–10.5)

## 2018-08-06 LAB — BASIC METABOLIC PANEL
Anion gap: 7 (ref 5–15)
BUN: 26 mg/dL — AB (ref 8–23)
CHLORIDE: 107 mmol/L (ref 98–111)
CO2: 24 mmol/L (ref 22–32)
Calcium: 9.2 mg/dL (ref 8.9–10.3)
Creatinine, Ser: 1.62 mg/dL — ABNORMAL HIGH (ref 0.61–1.24)
GFR calc Af Amer: 48 mL/min — ABNORMAL LOW (ref >60)
GFR calc non Af Amer: 42 mL/min — ABNORMAL LOW (ref >60)
GLUCOSE: 127 mg/dL — AB (ref 70–99)
POTASSIUM: 4.5 mmol/L (ref 3.5–5.1)
Sodium: 138 mmol/L (ref 135–145)

## 2018-08-06 NOTE — Progress Notes (Signed)
Physical Therapy Treatment Patient Details Name: Sean BertholdGrover H Doubleday Jr. MRN: [id number] DOB: [birth year] Today's Date: 08/06/2018    History of Present Illness Pt is a 67 YO male s/p L DA-THA on 9/24. PMH includes BPH, CAD, GERD, HTN. Past surgical history includes cardiac cath 2017, coronary stent placement 2017, R THA 2012.    PT Comments    Pt motivated and progressing well with mobility.  Spouse present and reviewed car transfers and home therex program with written instruction provided.   Follow Up Recommendations  Follow surgeon's recommendation for DC plan and follow-up therapies;Supervision for mobility/OOB     Equipment Recommendations  None recommended by PT    Recommendations for Other Services       Precautions / Restrictions Precautions Precautions: Fall Restrictions Weight Bearing Restrictions: No Other Position/Activity Restrictions: WBAT     Mobility  Bed Mobility               General bed mobility comments: NT - Pt up in chair and requests back to same  Transfers Overall transfer level: Needs assistance Equipment used: Rolling walker (2 wheeled) Transfers: Sit to/from Stand Sit to Stand: Supervision         General transfer comment: cues for LE management and use of UEs to self assist  Ambulation/Gait Ambulation/Gait assistance: Supervision Gait Distance (Feet): 400 Feet Assistive device: Rolling walker (2 wheeled) Gait Pattern/deviations: Step-to pattern;Step-through pattern;Decreased step length - right;Decreased step length - left;Shuffle;Trunk flexed Gait velocity: decr    General Gait Details: min cues for posture, position from RW and initial sequence   Stairs         General stair comments: Pt states comfortable with stairs after practicing this am   Wheelchair Mobility    Modified Rankin (Stroke Patients Only)       Balance Overall balance assessment: Mild deficits observed, not formally tested                                           Cognition Arousal/Alertness: Awake/alert Behavior During Therapy: WFL for tasks assessed/performed Overall Cognitive Status: Within Functional Limits for tasks assessed                                        Exercises Total Joint Exercises Ankle Circles/Pumps: AROM;Both;Seated;20 reps Quad Sets: AROM;Left;Supine;10 reps Hip ABduction/ADduction: AROM;10 reps;Standing;Left Knee Flexion: AROM;Left;10 reps;Standing Marching in Standing: AROM;Left;10 reps;Standing Standing Hip Extension: AROM;Left;10 reps;Standing    General Comments        Pertinent Vitals/Pain Pain Assessment: 0-10 Pain Score: 3  Pain Location: L hip  Pain Descriptors / Indicators: Sore;Operative site guarding Pain Intervention(s): Limited activity within patient's tolerance;Monitored during session;Premedicated before session;Ice applied    Home Living                      Prior Function            PT Goals (current goals can now be found in the care plan section) Acute Rehab PT Goals Patient Stated Goal: Regain IND PT Goal Formulation: With patient Time For Goal Achievement: 08/19/18 Potential to Achieve Goals: Good Progress towards PT goals: Progressing toward goals    Frequency    7X/week      PT Plan Current plan remains appropriate  Co-evaluation              AM-PAC PT "6 Clicks" Daily Activity  Outcome Measure  Difficulty turning over in bed (including adjusting bedclothes, sheets and blankets)?: A Lot Difficulty moving from lying on back to sitting on the side of the bed? : A Lot Difficulty sitting down on and standing up from a chair with arms (e.g., wheelchair, bedside commode, etc,.)?: A Lot Help needed moving to and from a bed to chair (including a wheelchair)?: None Help needed walking in hospital room?: None Help needed climbing 3-5 steps with a railing? : A Little 6 Click Score: 17    End of Session  Equipment Utilized During Treatment: Gait belt Activity Tolerance: Patient tolerated treatment well Patient left: in chair;with chair alarm set;with call bell/phone within reach;with family/visitor present Nurse Communication: Mobility status PT Visit Diagnosis: Other abnormalities of gait and mobility (R26.89);Difficulty in walking, not elsewhere classified (R26.2)     Time: 0981-1914 PT Time Calculation (min) (ACUTE ONLY): 25 min  Charges:  $Gait Training: 8-22 mins $Therapeutic Exercise: 8-22 mins                     Mauro Kaufmann PT Acute Rehabilitation Services Pager [phone] Office [phone]    Ina Scrivens 08/06/2018, 4:00 PM

## 2018-08-06 NOTE — Progress Notes (Signed)
Discharge paperwork discussed with pt and wife at the bedside. They demonstrated understanding, and questions were answered.  Pt was escorted in stable condition by wheelchair to main lobby.

## 2018-08-06 NOTE — Progress Notes (Signed)
Physical Therapy Treatment Patient Details Name: Sean Schultz. MRN: [id number] DOB: June 30, 1951 Today's Date: 08/06/2018    History of Present Illness Pt is a 67 YO male s/p L DA-THA on 9/24. PMH includes BPH, CAD, GERD, HTN. Past surgical history includes cardiac cath 2017, coronary stent placement 2017, R THA 2012.    PT Comments    Pt progressing well with mobility and eager for dc home this pm.  Reviewed stairs and home therex.   Follow Up Recommendations  Follow surgeon's recommendation for DC plan and follow-up therapies;Supervision for mobility/OOB     Equipment Recommendations  None recommended by PT    Recommendations for Other Services       Precautions / Restrictions Precautions Precautions: Fall Restrictions Weight Bearing Restrictions: No Other Position/Activity Restrictions: WBAT     Mobility  Bed Mobility               General bed mobility comments: NT - Pt up in chair and requests back to same  Transfers Overall transfer level: Needs assistance Equipment used: Rolling walker (2 wheeled) Transfers: Sit to/from Stand Sit to Stand: Min guard;Supervision         General transfer comment: cues for LE management and use of UEs to self assist  Ambulation/Gait Ambulation/Gait assistance: Min guard;Supervision Gait Distance (Feet): 450 Feet Assistive device: Rolling walker (2 wheeled) Gait Pattern/deviations: Step-to pattern;Step-through pattern;Decreased step length - right;Decreased step length - left;Shuffle;Trunk flexed Gait velocity: decr    General Gait Details: cues for posture, position from RW and initial sequence   Stairs Stairs: Yes Stairs assistance: Min guard Stair Management: One rail Right;Step to pattern;Forwards;With cane Number of Stairs: 8 General stair comments: cues for sequence and foot/cane placement   Wheelchair Mobility    Modified Rankin (Stroke Patients Only)       Balance Overall balance assessment:  Mild deficits observed, not formally tested                                          Cognition Arousal/Alertness: Awake/alert Behavior During Therapy: WFL for tasks assessed/performed Overall Cognitive Status: Within Functional Limits for tasks assessed                                        Exercises Total Joint Exercises Ankle Circles/Pumps: AROM;Both;Seated;20 reps Quad Sets: AROM;Left;Supine;10 reps Heel Slides: AAROM;Left;20 reps;Supine Hip ABduction/ADduction: AAROM;Left;20 reps;Supine Long Arc Quad: AROM;Left;15 reps;Supine    General Comments        Pertinent Vitals/Pain Pain Assessment: 0-10 Pain Score: 3  Pain Location: L hip  Pain Descriptors / Indicators: Sore;Operative site guarding Pain Intervention(s): Limited activity within patient's tolerance;Monitored during session;Premedicated before session;Ice applied    Home Living                      Prior Function            PT Goals (current goals can now be found in the care plan section) Acute Rehab PT Goals Patient Stated Goal: Regain IND PT Goal Formulation: With patient Time For Goal Achievement: 08/19/18 Potential to Achieve Goals: Good Progress towards PT goals: Progressing toward goals    Frequency    7X/week      PT Plan Current plan remains appropriate  Co-evaluation              AM-PAC PT "6 Clicks" Daily Activity  Outcome Measure  Difficulty turning over in bed (including adjusting bedclothes, sheets and blankets)?: A Lot Difficulty moving from lying on back to sitting on the side of the bed? : A Lot Difficulty sitting down on and standing up from a chair with arms (e.g., wheelchair, bedside commode, etc,.)?: A Lot Help needed moving to and from a bed to chair (including a wheelchair)?: None Help needed walking in hospital room?: None Help needed climbing 3-5 steps with a railing? : A Little 6 Click Score: 17    End of Session  Equipment Utilized During Treatment: Gait belt Activity Tolerance: Patient tolerated treatment well Patient left: in chair;with chair alarm set;with call bell/phone within reach;with family/visitor present Nurse Communication: Mobility status PT Visit Diagnosis: Other abnormalities of gait and mobility (R26.89);Difficulty in walking, not elsewhere classified (R26.2)     Time: 1107-1140 PT Time Calculation (min) (ACUTE ONLY): 33 min  Charges:  $Gait Training: 8-22 mins $Therapeutic Exercise: 8-22 mins                     Mauro Kaufmann PT Acute Rehabilitation Services Pager [phone] Office [phone]    Daeshawn Redmann 08/06/2018, 12:29 PM

## 2018-08-06 NOTE — Plan of Care (Signed)
Pt doing well this am. Pain well controlled. Plan to d/c home today per MD order. Plan of care discussed with pt. RN will monitor.

## 2018-08-06 NOTE — Progress Notes (Signed)
     Subjective: 1 Day Post-Op Procedure(s) (LRB): LEFT TOTAL HIP ARTHROPLASTY ANTERIOR APPROACH (Left)   Patient reports pain as mild, pain controlled.  Minimal sleep, but otherwise no events throughout the night.   Ready to improve and get his life back, without pain.  Ready to be discharged home.    Objective:   VITALS:   Vitals:   08/06/18 0134 08/06/18 0631  BP: (!) 88/55 110/71  Pulse: 78 80  Resp: 17 17  Temp: 97.6 F (36.4 C) 98.3 F (36.8 C)  SpO2: 95% 100%    Dorsiflexion/Plantar flexion intact Incision: dressing C/D/I No cellulitis present Compartment soft  LABS Recent Labs    08/06/18 0450  HGB 10.8*  HCT 32.7*  WBC 8.8  PLT 218    Recent Labs    08/06/18 0450  NA 138  K 4.5  BUN 26*  CREATININE 1.62*  GLUCOSE 127*     Assessment/Plan: 1 Day Post-Op Procedure(s) (LRB): LEFT TOTAL HIP ARTHROPLASTY ANTERIOR APPROACH (Left) Foley cath d/c'ed Advance diet Up with therapy D/C IV fluids Discharge home with home health  Follow up in 2 weeks at [facility] Follow up with OLIN,Tyrell Brereton D in 2 weeks.  Contact information:  [facility]) [address], [address] [place] [address] [phone]    Overweight (BMI 25-29.9) Estimated body mass index is 26.94 kg/m as calculated from the following:   Height as of this encounter: 5\' 7"  (1.702 m).   Weight as of this encounter: 78 kg. Patient also counseled that weight may inhibit the healing process Patient counseled that losing weight will help with future health issues         Anastasio Auerbach. Adelyn Roscher   PAC  08/06/2018, 8:00 AM

## 2018-08-07 ENCOUNTER — Other Ambulatory Visit: Payer: Self-pay | Admitting: Cardiovascular Disease

## 2018-08-08 ENCOUNTER — Encounter (HOSPITAL_COMMUNITY): Payer: Self-pay

## 2018-08-11 ENCOUNTER — Encounter (HOSPITAL_COMMUNITY): Payer: Self-pay

## 2018-08-11 NOTE — Discharge Summary (Signed)
Physician Discharge Summary  Patient ID: Sean Schultz. MRN: [id number] DOB/AGE: Jul 31, 1951 67 y.o.  Admit date: 08/05/2018 Discharge date: 08/06/2018   Procedures:  Procedure(s) (LRB): LEFT TOTAL HIP ARTHROPLASTY ANTERIOR APPROACH (Left)  Attending Physician:  Dr. Durene Romans   Admission Diagnoses:   Left hip primary OA / pain  Discharge Diagnoses:  Principal Problem:   S/P left THA, AA Active Problems:   Overweight (BMI 25.0-29.9)  Past Medical History:  Diagnosis Date  . Arthritis   . Asthma    seasonal asthma as a child  . BPH (benign prostatic hypertrophy)   . Coronary artery disease    stent 2017  . GERD (gastroesophageal reflux disease)   . H/O seasonal allergies   . Hyperlipemia   . Hypertension   . Prostatitis     HPI:    Sean Schultz, 67 y.o. male, has a history of pain and functional disability in the left hip(s) due to arthritis and patient has failed non-surgical conservative treatments for greater than 12 weeks to include NSAID's and/or analgesics, corticosteriod injections, use of assistive devices and activity modification.  Onset of symptoms was abrupt starting <1 years ago with rapidlly worsening course since that time.The patient noted prior procedures of the hip to include arthroplasty on the right hip(s).  Patient currently rates pain in the left hip at 9 out of 10 with activity. Patient has worsening of pain with activity and weight bearing, trendelenberg gait, pain that interfers with activities of daily living and pain with passive range of motion. Patient has evidence of periarticular osteophytes and joint space narrowing by imaging studies. This condition presents safety issues increasing the risk of falls. There is no current active infection.  Risks, benefits and expectations were discussed with the patient.  Risks including but not limited to the risk of anesthesia, blood clots, nerve damage, blood vessel damage, failure of the prosthesis,  infection and up to and including death.  Patient understand the risks, benefits and expectations and wishes to proceed with surgery.   PCP: Titus Dubin., MD   Discharged Condition: good  Hospital Course:  Patient underwent the above stated procedure on 08/05/2018. Patient tolerated the procedure well and brought to the recovery room in good condition and subsequently to the floor.  POD #1 BP: 110/71 ; Pulse: 80 ; Temp: 98.3 F (36.8 C) ; Resp: 17 Patient reports pain as mild, pain controlled.  Minimal sleep, but otherwise no events throughout the night.   Ready to improve and get his life back, without pain.  Ready to be discharged home.  Dorsiflexion/plantar flexion intact, incision: dressing C/D/I, no cellulitis present and compartment soft.   LABS  Basename    HGB     10.8  HCT     32.7    Discharge Exam: General appearance: alert, cooperative and no distress Extremities: Homans sign is negative, no sign of DVT, no edema, redness or tenderness in the calves or thighs and no ulcers, gangrene or trophic changes  Disposition:  Home with follow up in 2 weeks   Follow-up Information    Durene Romans, MD. Schedule an appointment as soon as possible for a visit in 2 weeks.   Specialty:  Orthopedic Surgery Contact information: [address] [place] [address] [phone]           Discharge Instructions    Call MD / Call 911   Complete by:  As directed    If you experience chest  pain or shortness of breath, CALL 911 and be transported to the hospital emergency room.  If you develope a fever above 101 F, pus (white drainage) or increased drainage or redness at the wound, or calf pain, call your surgeon's office.   Change dressing   Complete by:  As directed    Maintain surgical dressing until follow up in the clinic. If the edges start to pull up, may reinforce with tape. If the dressing is no longer working, may remove and cover with gauze and tape,  but must keep the area dry and clean.  Call with any questions or concerns.   Constipation Prevention   Complete by:  As directed    Drink plenty of fluids.  Prune juice may be helpful.  You may use a stool softener, such as Colace (over the counter) 100 mg twice a day.  Use MiraLax (over the counter) for constipation as needed.   Diet - low sodium heart healthy   Complete by:  As directed    Discharge instructions   Complete by:  As directed    Maintain surgical dressing until follow up in the clinic. If the edges start to pull up, may reinforce with tape. If the dressing is no longer working, may remove and cover with gauze and tape, but must keep the area dry and clean.  Follow up in 2 weeks at [facility]. Call with any questions or concerns.   Increase activity slowly as tolerated   Complete by:  As directed    Weight bearing as tolerated with assist device (walker, cane, etc) as directed, use it as long as suggested by your surgeon or therapist, typically at least 4-6 weeks.   TED hose   Complete by:  As directed    Use stockings (TED hose) for 2 weeks on both leg(s).  You may remove them at night for sleeping.      Allergies as of 08/06/2018      Reactions   Penicillins Swelling, Other (See Comments)   Dyspnea, hair loss-childhood allergy Has patient had a PCN reaction causing immediate rash, facial/tongue/throat swelling, SOB or lightheadedness with hypotension:unsure Has patient had a PCN reaction causing severe rash involving mucus membranes or skin necrosis:unsure Has patient had a PCN reaction that required hospitalization:unsure Has patient had a PCN reaction occurring within the last 10 years:No If all of the above answers are "NO", then may proceed with Cephalosporin use.   Lipitor [atorvastatin Calcium]    LFT increased   Rosuvastatin    In high doses caused levated LFT Joint muscle pain       Medication List    TAKE these medications   ALAWAY OP Place  1 drop into both eyes daily as needed (allergies).   aspirin EC 81 MG tablet Take 81 mg by mouth every evening.   calcium carbonate 500 MG chewable tablet Commonly known as:  TUMS - dosed in mg elemental calcium Chew 1 tablet by mouth daily as needed for heartburn.   cetirizine 10 MG tablet Commonly known as:  ZYRTEC Take 10 mg by mouth daily as needed for allergies.   CHOLESTOFF COMPLETE 300-100 MG Caps Generic drug:  Plant Sterol Stanol-Pantethine Take 2 capsules by mouth every evening.   CoQ-10 100 MG Caps Take 100 mg by mouth 2 (two) times daily.   diphenhydrAMINE 25 MG tablet Commonly known as:  BENADRYL Take 25 mg by mouth daily as needed for allergies.   docusate sodium 100 MG capsule  Commonly known as:  COLACE Take 1 capsule (100 mg total) by mouth 2 (two) times daily.   esomeprazole 20 MG capsule Commonly known as:  NEXIUM Take 20 mg by mouth at bedtime.   fenofibrate 160 MG tablet Take 160 mg by mouth daily.   ferrous sulfate 325 (65 FE) MG tablet Take 1 tablet (325 mg total) by mouth 3 (three) times daily with meals.   fluticasone 50 MCG/ACT nasal spray Commonly known as:  FLONASE Place 1 spray into both nostrils daily as needed for allergies or rhinitis.   folic acid 400 MCG tablet Commonly known as:  FOLVITE Take 400 mcg by mouth daily.   HYDROcodone-acetaminophen 7.5-325 MG tablet Commonly known as:  NORCO Take 1-2 tablets by mouth every 4 (four) hours as needed for moderate pain.   irbesartan 150 MG tablet Commonly known as:  AVAPRO Take 1 tablet (150 mg total) by mouth daily. What changed:  when to take this   Krill Oil 500 MG Caps Take 500 mg by mouth daily.   Melatonin 3 MG Tabs Take 3 mg by mouth at bedtime as needed (sleep).   methocarbamol 500 MG tablet Commonly known as:  ROBAXIN Take 1 tablet (500 mg total) by mouth every 6 (six) hours as needed for muscle spasms.   metoprolol tartrate 25 MG tablet Commonly known as:   LOPRESSOR TAKE ONE TABLET BY MOUTH TWO TIMES A DAY   montelukast 10 MG tablet Commonly known as:  SINGULAIR Take 10 mg by mouth daily as needed (allergies).   nitroGLYCERIN 0.4 MG SL tablet Commonly known as:  NITROSTAT Place 1 tablet (0.4 mg total) under the tongue every 5 (five) minutes as needed. What changed:  reasons to take this   pantoprazole 40 MG tablet Commonly known as:  PROTONIX Take 1 tablet (40 mg total) by mouth daily.   polyethylene glycol packet Commonly known as:  MIRALAX / GLYCOLAX Take 17 g by mouth 2 (two) times daily.   psyllium 0.52 g capsule Commonly known as:  REGULOID Take 2.6 g by mouth every evening.   RA VITAMIN B-12 TR 1000 MCG Tbcr Generic drug:  Cyanocobalamin Take 1,000 mcg by mouth daily.   rosuvastatin 5 MG tablet Commonly known as:  CRESTOR Take 5 mg by mouth every Monday, Wednesday, and Friday. Notes to patient:  Take as prescribed   SLO-NIACIN 500 MG tablet Generic drug:  niacin Take 500 mg by mouth daily.            Discharge Care Instructions  (From admission, onward)         Start     Ordered   08/06/18 0000  Change dressing    Comments:  Maintain surgical dressing until follow up in the clinic. If the edges start to pull up, may reinforce with tape. If the dressing is no longer working, may remove and cover with gauze and tape, but must keep the area dry and clean.  Call with any questions or concerns.   08/06/18 0804           Signed: Anastasio Auerbach. Jerolene Kupfer   PA-C  08/11/2018, 11:21 PM

## 2018-08-13 ENCOUNTER — Encounter (HOSPITAL_COMMUNITY): Payer: Self-pay

## 2018-08-13 DIAGNOSIS — I2510 Atherosclerotic heart disease of native coronary artery without angina pectoris: Secondary | ICD-10-CM | POA: Insufficient documentation

## 2018-08-15 ENCOUNTER — Encounter (HOSPITAL_COMMUNITY): Payer: Self-pay

## 2018-08-18 ENCOUNTER — Encounter (HOSPITAL_COMMUNITY): Payer: Self-pay

## 2018-08-20 ENCOUNTER — Encounter (HOSPITAL_COMMUNITY): Payer: Self-pay

## 2018-08-22 ENCOUNTER — Encounter (HOSPITAL_COMMUNITY): Payer: Self-pay

## 2018-08-25 ENCOUNTER — Encounter (HOSPITAL_COMMUNITY): Payer: Self-pay

## 2018-08-27 ENCOUNTER — Encounter (HOSPITAL_COMMUNITY): Payer: Self-pay

## 2018-08-29 ENCOUNTER — Encounter (HOSPITAL_COMMUNITY): Payer: Self-pay

## 2018-09-01 ENCOUNTER — Encounter (HOSPITAL_COMMUNITY): Payer: Self-pay

## 2018-09-03 ENCOUNTER — Encounter (HOSPITAL_COMMUNITY)
Admission: RE | Admit: 2018-09-03 | Discharge: 2018-09-03 | Disposition: A | Discharge status: Home / Self Care | Payer: Medicare Other | Source: Ambulatory Visit | Attending: Cardiovascular Disease | Admitting: Cardiovascular Disease

## 2018-09-05 ENCOUNTER — Encounter (HOSPITAL_COMMUNITY)
Admission: RE | Admit: 2018-09-05 | Discharge: 2018-09-05 | Disposition: A | Discharge status: Home / Self Care | Payer: Medicare Other | Source: Ambulatory Visit | Attending: Cardiovascular Disease | Admitting: Cardiovascular Disease

## 2018-09-08 ENCOUNTER — Encounter (HOSPITAL_COMMUNITY)
Admission: RE | Admit: 2018-09-08 | Discharge: 2018-09-08 | Disposition: A | Discharge status: Home / Self Care | Payer: Medicare Other | Source: Ambulatory Visit | Attending: Cardiovascular Disease | Admitting: Cardiovascular Disease

## 2018-09-10 ENCOUNTER — Encounter (HOSPITAL_COMMUNITY)
Admission: RE | Admit: 2018-09-10 | Discharge: 2018-09-10 | Disposition: A | Discharge status: Home / Self Care | Payer: Medicare Other | Source: Ambulatory Visit | Attending: Cardiovascular Disease | Admitting: Cardiovascular Disease

## 2018-09-12 ENCOUNTER — Encounter (HOSPITAL_COMMUNITY)
Admission: RE | Admit: 2018-09-12 | Discharge: 2018-09-12 | Disposition: A | Discharge status: Home / Self Care | Payer: Self-pay | Source: Ambulatory Visit | Attending: Cardiovascular Disease | Admitting: Cardiovascular Disease

## 2018-09-12 DIAGNOSIS — I2510 Atherosclerotic heart disease of native coronary artery without angina pectoris: Secondary | ICD-10-CM | POA: Insufficient documentation

## 2018-09-15 ENCOUNTER — Encounter (HOSPITAL_COMMUNITY)
Admission: RE | Admit: 2018-09-15 | Discharge: 2018-09-15 | Disposition: A | Discharge status: Home / Self Care | Payer: Self-pay | Source: Ambulatory Visit | Attending: Cardiovascular Disease | Admitting: Cardiovascular Disease

## 2018-09-17 ENCOUNTER — Encounter (HOSPITAL_COMMUNITY): Payer: Self-pay

## 2018-09-19 ENCOUNTER — Encounter (HOSPITAL_COMMUNITY)
Admission: RE | Admit: 2018-09-19 | Discharge: 2018-09-19 | Disposition: A | Discharge status: Home / Self Care | Payer: Self-pay | Source: Ambulatory Visit | Attending: Cardiovascular Disease | Admitting: Cardiovascular Disease

## 2018-09-22 ENCOUNTER — Encounter (HOSPITAL_COMMUNITY)
Admission: RE | Admit: 2018-09-22 | Discharge: 2018-09-22 | Disposition: A | Discharge status: Home / Self Care | Payer: Self-pay | Source: Ambulatory Visit | Attending: Cardiovascular Disease | Admitting: Cardiovascular Disease

## 2018-09-24 ENCOUNTER — Encounter (HOSPITAL_COMMUNITY)
Admission: RE | Admit: 2018-09-24 | Discharge: 2018-09-24 | Disposition: A | Discharge status: Home / Self Care | Payer: Self-pay | Source: Ambulatory Visit | Attending: Cardiovascular Disease | Admitting: Cardiovascular Disease

## 2018-09-26 ENCOUNTER — Encounter (HOSPITAL_COMMUNITY)
Admission: RE | Admit: 2018-09-26 | Discharge: 2018-09-26 | Disposition: A | Discharge status: Home / Self Care | Payer: Self-pay | Source: Ambulatory Visit | Attending: Cardiovascular Disease | Admitting: Cardiovascular Disease

## 2018-09-29 ENCOUNTER — Encounter (HOSPITAL_COMMUNITY)
Admission: RE | Admit: 2018-09-29 | Discharge: 2018-09-29 | Disposition: A | Discharge status: Home / Self Care | Payer: Self-pay | Source: Ambulatory Visit | Attending: Cardiovascular Disease | Admitting: Cardiovascular Disease

## 2018-10-01 ENCOUNTER — Encounter (HOSPITAL_COMMUNITY)
Admission: RE | Admit: 2018-10-01 | Discharge: 2018-10-01 | Disposition: A | Discharge status: Home / Self Care | Payer: Self-pay | Source: Ambulatory Visit | Attending: Cardiovascular Disease | Admitting: Cardiovascular Disease

## 2018-10-03 ENCOUNTER — Encounter (HOSPITAL_COMMUNITY): Payer: Self-pay

## 2018-10-06 ENCOUNTER — Encounter (HOSPITAL_COMMUNITY)
Admission: RE | Admit: 2018-10-06 | Discharge: 2018-10-06 | Disposition: A | Discharge status: Home / Self Care | Payer: Self-pay | Source: Ambulatory Visit | Attending: Cardiovascular Disease | Admitting: Cardiovascular Disease

## 2018-10-08 ENCOUNTER — Encounter (HOSPITAL_COMMUNITY)
Admission: RE | Admit: 2018-10-08 | Discharge: 2018-10-08 | Disposition: A | Discharge status: Home / Self Care | Payer: Self-pay | Source: Ambulatory Visit | Attending: Cardiovascular Disease | Admitting: Cardiovascular Disease

## 2018-10-10 ENCOUNTER — Encounter (HOSPITAL_COMMUNITY): Payer: Self-pay

## 2018-10-13 ENCOUNTER — Encounter (HOSPITAL_COMMUNITY)
Admission: RE | Admit: 2018-10-13 | Discharge: 2018-10-13 | Disposition: A | Discharge status: Home / Self Care | Payer: Medicare Other | Source: Ambulatory Visit | Attending: Cardiovascular Disease | Admitting: Cardiovascular Disease

## 2018-10-13 DIAGNOSIS — I2510 Atherosclerotic heart disease of native coronary artery without angina pectoris: Secondary | ICD-10-CM | POA: Insufficient documentation

## 2018-10-15 ENCOUNTER — Encounter (HOSPITAL_COMMUNITY)
Admission: RE | Admit: 2018-10-15 | Discharge: 2018-10-15 | Disposition: A | Discharge status: Home / Self Care | Payer: Medicare Other | Source: Ambulatory Visit | Attending: Cardiovascular Disease | Admitting: Cardiovascular Disease

## 2018-10-17 ENCOUNTER — Encounter (HOSPITAL_COMMUNITY)
Admission: RE | Admit: 2018-10-17 | Discharge: 2018-10-17 | Disposition: A | Discharge status: Home / Self Care | Payer: Medicare Other | Source: Ambulatory Visit | Attending: Cardiovascular Disease | Admitting: Cardiovascular Disease

## 2018-10-20 ENCOUNTER — Encounter (HOSPITAL_COMMUNITY)
Admission: RE | Admit: 2018-10-20 | Discharge: 2018-10-20 | Disposition: A | Discharge status: Home / Self Care | Payer: Medicare Other | Source: Ambulatory Visit | Attending: Cardiovascular Disease | Admitting: Cardiovascular Disease

## 2018-10-22 ENCOUNTER — Encounter (HOSPITAL_COMMUNITY)
Admission: RE | Admit: 2018-10-22 | Discharge: 2018-10-22 | Disposition: A | Discharge status: Home / Self Care | Payer: Medicare Other | Source: Ambulatory Visit | Attending: Cardiovascular Disease | Admitting: Cardiovascular Disease

## 2018-10-24 ENCOUNTER — Encounter (HOSPITAL_COMMUNITY)
Admission: RE | Admit: 2018-10-24 | Discharge: 2018-10-24 | Disposition: A | Discharge status: Home / Self Care | Payer: Medicare Other | Source: Ambulatory Visit | Attending: Cardiovascular Disease | Admitting: Cardiovascular Disease

## 2018-10-27 ENCOUNTER — Encounter (HOSPITAL_COMMUNITY)
Admission: RE | Admit: 2018-10-27 | Discharge: 2018-10-27 | Disposition: A | Discharge status: Home / Self Care | Payer: Medicare Other | Source: Ambulatory Visit | Attending: Cardiovascular Disease | Admitting: Cardiovascular Disease

## 2018-10-29 ENCOUNTER — Encounter (HOSPITAL_COMMUNITY)
Admission: RE | Admit: 2018-10-29 | Discharge: 2018-10-29 | Disposition: A | Discharge status: Home / Self Care | Payer: Self-pay | Source: Ambulatory Visit | Attending: Cardiovascular Disease | Admitting: Cardiovascular Disease

## 2018-10-31 ENCOUNTER — Encounter (HOSPITAL_COMMUNITY)
Admission: RE | Admit: 2018-10-31 | Discharge: 2018-10-31 | Disposition: A | Discharge status: Home / Self Care | Payer: Medicare Other | Source: Ambulatory Visit | Attending: Cardiovascular Disease | Admitting: Cardiovascular Disease

## 2018-11-03 ENCOUNTER — Encounter (HOSPITAL_COMMUNITY)
Admission: RE | Admit: 2018-11-03 | Discharge: 2018-11-03 | Disposition: A | Discharge status: Home / Self Care | Payer: Self-pay | Source: Ambulatory Visit | Attending: Cardiovascular Disease | Admitting: Cardiovascular Disease

## 2018-11-07 ENCOUNTER — Encounter (HOSPITAL_COMMUNITY): Payer: Self-pay

## 2018-11-10 ENCOUNTER — Encounter (HOSPITAL_COMMUNITY): Payer: Self-pay

## 2018-11-14 ENCOUNTER — Encounter (HOSPITAL_COMMUNITY)
Admission: RE | Admit: 2018-11-14 | Discharge: 2018-11-14 | Disposition: A | Discharge status: Home / Self Care | Payer: Self-pay | Source: Ambulatory Visit | Attending: Cardiovascular Disease | Admitting: Cardiovascular Disease

## 2018-11-14 DIAGNOSIS — I2510 Atherosclerotic heart disease of native coronary artery without angina pectoris: Secondary | ICD-10-CM | POA: Insufficient documentation

## 2018-11-17 ENCOUNTER — Encounter (HOSPITAL_COMMUNITY)
Admission: RE | Admit: 2018-11-17 | Discharge: 2018-11-17 | Disposition: A | Discharge status: Home / Self Care | Payer: Self-pay | Source: Ambulatory Visit | Attending: Cardiovascular Disease | Admitting: Cardiovascular Disease

## 2018-11-19 ENCOUNTER — Encounter (HOSPITAL_COMMUNITY)
Admission: RE | Admit: 2018-11-19 | Discharge: 2018-11-19 | Disposition: A | Discharge status: Home / Self Care | Payer: Self-pay | Source: Ambulatory Visit | Attending: Cardiovascular Disease | Admitting: Cardiovascular Disease

## 2018-11-21 ENCOUNTER — Encounter (HOSPITAL_COMMUNITY)
Admission: RE | Admit: 2018-11-21 | Discharge: 2018-11-21 | Disposition: A | Discharge status: Home / Self Care | Payer: Self-pay | Source: Ambulatory Visit | Attending: Cardiovascular Disease | Admitting: Cardiovascular Disease

## 2018-11-24 ENCOUNTER — Encounter (HOSPITAL_COMMUNITY)
Admission: RE | Admit: 2018-11-24 | Discharge: 2018-11-24 | Disposition: A | Discharge status: Home / Self Care | Payer: Self-pay | Source: Ambulatory Visit | Attending: Cardiovascular Disease | Admitting: Cardiovascular Disease

## 2018-11-26 ENCOUNTER — Encounter (HOSPITAL_COMMUNITY)
Admission: RE | Admit: 2018-11-26 | Discharge: 2018-11-26 | Disposition: A | Discharge status: Home / Self Care | Payer: Self-pay | Source: Ambulatory Visit | Attending: Cardiovascular Disease | Admitting: Cardiovascular Disease

## 2018-11-28 ENCOUNTER — Encounter (HOSPITAL_COMMUNITY): Payer: Self-pay

## 2018-12-01 ENCOUNTER — Encounter (HOSPITAL_COMMUNITY)
Admission: RE | Admit: 2018-12-01 | Discharge: 2018-12-01 | Disposition: A | Discharge status: Home / Self Care | Payer: Self-pay | Source: Ambulatory Visit | Attending: Cardiovascular Disease | Admitting: Cardiovascular Disease

## 2018-12-03 ENCOUNTER — Encounter (HOSPITAL_COMMUNITY)
Admission: RE | Admit: 2018-12-03 | Discharge: 2018-12-03 | Disposition: A | Discharge status: Home / Self Care | Payer: Self-pay | Source: Ambulatory Visit | Attending: Cardiovascular Disease | Admitting: Cardiovascular Disease

## 2018-12-05 ENCOUNTER — Encounter (HOSPITAL_COMMUNITY)
Admission: RE | Admit: 2018-12-05 | Discharge: 2018-12-05 | Disposition: A | Discharge status: Home / Self Care | Payer: Self-pay | Source: Ambulatory Visit | Attending: Cardiovascular Disease | Admitting: Cardiovascular Disease

## 2018-12-08 ENCOUNTER — Encounter (HOSPITAL_COMMUNITY)
Admission: RE | Admit: 2018-12-08 | Discharge: 2018-12-08 | Disposition: A | Discharge status: Home / Self Care | Payer: Self-pay | Source: Ambulatory Visit | Attending: Cardiovascular Disease | Admitting: Cardiovascular Disease

## 2018-12-10 ENCOUNTER — Encounter (HOSPITAL_COMMUNITY)
Admission: RE | Admit: 2018-12-10 | Discharge: 2018-12-10 | Disposition: A | Discharge status: Home / Self Care | Payer: Self-pay | Source: Ambulatory Visit | Attending: Cardiovascular Disease | Admitting: Cardiovascular Disease

## 2018-12-11 ENCOUNTER — Encounter (HOSPITAL_COMMUNITY)
Admission: RE | Admit: 2018-12-11 | Discharge: 2018-12-11 | Disposition: A | Discharge status: Home / Self Care | Payer: Self-pay | Source: Ambulatory Visit | Attending: Cardiovascular Disease | Admitting: Cardiovascular Disease

## 2018-12-12 ENCOUNTER — Encounter (HOSPITAL_COMMUNITY): Payer: Self-pay

## 2018-12-15 ENCOUNTER — Encounter (HOSPITAL_COMMUNITY): Payer: Self-pay

## 2018-12-15 DIAGNOSIS — I2510 Atherosclerotic heart disease of native coronary artery without angina pectoris: Secondary | ICD-10-CM | POA: Insufficient documentation

## 2018-12-17 ENCOUNTER — Encounter (HOSPITAL_COMMUNITY)
Admission: RE | Admit: 2018-12-17 | Discharge: 2018-12-17 | Disposition: A | Discharge status: Home / Self Care | Payer: Medicare Other | Source: Ambulatory Visit | Attending: Cardiovascular Disease | Admitting: Cardiovascular Disease

## 2018-12-19 ENCOUNTER — Encounter (HOSPITAL_COMMUNITY)
Admission: RE | Admit: 2018-12-19 | Discharge: 2018-12-19 | Disposition: A | Discharge status: Home / Self Care | Payer: Medicare Other | Source: Ambulatory Visit | Attending: Cardiovascular Disease | Admitting: Cardiovascular Disease

## 2018-12-22 ENCOUNTER — Encounter (HOSPITAL_COMMUNITY)
Admission: RE | Admit: 2018-12-22 | Discharge: 2018-12-22 | Disposition: A | Discharge status: Home / Self Care | Payer: Medicare Other | Source: Ambulatory Visit | Attending: Cardiovascular Disease | Admitting: Cardiovascular Disease

## 2018-12-24 ENCOUNTER — Encounter (HOSPITAL_COMMUNITY)
Admission: RE | Admit: 2018-12-24 | Discharge: 2018-12-24 | Disposition: A | Discharge status: Home / Self Care | Payer: Medicare Other | Source: Ambulatory Visit | Attending: Cardiovascular Disease | Admitting: Cardiovascular Disease

## 2018-12-26 ENCOUNTER — Encounter (HOSPITAL_COMMUNITY)
Admission: RE | Admit: 2018-12-26 | Discharge: 2018-12-26 | Disposition: A | Discharge status: Home / Self Care | Payer: Medicare Other | Source: Ambulatory Visit | Attending: Cardiovascular Disease | Admitting: Cardiovascular Disease

## 2018-12-29 ENCOUNTER — Encounter (HOSPITAL_COMMUNITY)
Admission: RE | Admit: 2018-12-29 | Discharge: 2018-12-29 | Disposition: A | Discharge status: Home / Self Care | Payer: Medicare Other | Source: Ambulatory Visit | Attending: Cardiovascular Disease | Admitting: Cardiovascular Disease

## 2018-12-31 ENCOUNTER — Encounter (HOSPITAL_COMMUNITY)
Admission: RE | Admit: 2018-12-31 | Discharge: 2018-12-31 | Disposition: A | Discharge status: Home / Self Care | Payer: Medicare Other | Source: Ambulatory Visit | Attending: Cardiovascular Disease | Admitting: Cardiovascular Disease

## 2019-01-02 ENCOUNTER — Encounter (HOSPITAL_COMMUNITY): Payer: Self-pay

## 2019-01-05 ENCOUNTER — Encounter (HOSPITAL_COMMUNITY)
Admission: RE | Admit: 2019-01-05 | Discharge: 2019-01-05 | Disposition: A | Discharge status: Home / Self Care | Payer: Medicare Other | Source: Ambulatory Visit | Attending: Cardiovascular Disease | Admitting: Cardiovascular Disease

## 2019-01-07 ENCOUNTER — Encounter (HOSPITAL_COMMUNITY): Payer: Self-pay

## 2019-01-09 ENCOUNTER — Encounter (HOSPITAL_COMMUNITY)
Admission: RE | Admit: 2019-01-09 | Discharge: 2019-01-09 | Disposition: A | Discharge status: Home / Self Care | Payer: Medicare Other | Source: Ambulatory Visit | Attending: Cardiovascular Disease | Admitting: Cardiovascular Disease

## 2019-01-12 ENCOUNTER — Encounter (HOSPITAL_COMMUNITY)
Admission: RE | Admit: 2019-01-12 | Discharge: 2019-01-12 | Disposition: A | Discharge status: Home / Self Care | Payer: Medicare Other | Source: Ambulatory Visit | Attending: Cardiovascular Disease | Admitting: Cardiovascular Disease

## 2019-01-12 DIAGNOSIS — I2510 Atherosclerotic heart disease of native coronary artery without angina pectoris: Secondary | ICD-10-CM | POA: Insufficient documentation

## 2019-01-14 ENCOUNTER — Encounter (HOSPITAL_COMMUNITY)
Admission: RE | Admit: 2019-01-14 | Discharge: 2019-01-14 | Disposition: A | Discharge status: Home / Self Care | Payer: Medicare Other | Source: Ambulatory Visit | Attending: Cardiovascular Disease | Admitting: Cardiovascular Disease

## 2019-01-16 ENCOUNTER — Encounter (HOSPITAL_COMMUNITY)
Admission: RE | Admit: 2019-01-16 | Discharge: 2019-01-16 | Disposition: A | Discharge status: Home / Self Care | Payer: Medicare Other | Source: Ambulatory Visit | Attending: Cardiovascular Disease | Admitting: Cardiovascular Disease

## 2019-01-19 ENCOUNTER — Encounter (HOSPITAL_COMMUNITY)
Admission: RE | Admit: 2019-01-19 | Discharge: 2019-01-19 | Disposition: A | Discharge status: Home / Self Care | Payer: Medicare Other | Source: Ambulatory Visit | Attending: Cardiovascular Disease | Admitting: Cardiovascular Disease

## 2019-01-21 ENCOUNTER — Other Ambulatory Visit: Payer: Self-pay

## 2019-01-21 ENCOUNTER — Ambulatory Visit: Payer: Medicare Other | Admitting: Cardiovascular Disease

## 2019-01-21 ENCOUNTER — Encounter: Payer: Self-pay | Admitting: Cardiovascular Disease

## 2019-01-21 ENCOUNTER — Encounter (HOSPITAL_COMMUNITY)
Admission: RE | Admit: 2019-01-21 | Discharge: 2019-01-21 | Disposition: A | Discharge status: Home / Self Care | Payer: Self-pay | Source: Ambulatory Visit | Attending: Cardiovascular Disease | Admitting: Cardiovascular Disease

## 2019-01-21 NOTE — Progress Notes (Deleted)
No chief complaint on file.    History of Present Illness: 68 yo male with history of CAD, HTN, HLD, GERD who is here today for cardiac follow up. He was evaluated in November 2017 by Dr. Donnie Aho for coronary calcification and had a stress test that showed ischemia. I performed a cardiac cath on 09/21/16 which showed a calcified 99% mid RCA stenosis. I brought him back on 09/24/16 for [facility] orbital atherectomy of the RCA followed by placement of a Synergy DES x 1 in the mid RCA. He did well following the procedure. Echo 05/20/17 with normal LV systolic function and no valve disease.  He is here today for follow up. The patient denies any chest pain, dyspnea, palpitations, lower extremity edema, orthopnea, PND, dizziness, near syncope or syncope.    Primary Care Physician: Titus Dubin., MD  Past Medical History:  Diagnosis Date  . Arthritis   . Asthma    seasonal asthma as a child  . BPH (benign prostatic hypertrophy)   . Coronary artery disease    stent 2017  . GERD (gastroesophageal reflux disease)   . H/O seasonal allergies   . Hyperlipemia   . Hypertension   . Prostatitis     Past Surgical History:  Procedure Laterality Date  . APPENDECTOMY    . CARDIAC CATHETERIZATION N/A 09/21/2016   Procedure: Left Heart Cath and Coronary Angiography;  Surgeon: [name], MD;  Location: [facility] INVASIVE CV LAB;  Service: Cardiovascular;  Laterality: N/A;  . CARDIAC CATHETERIZATION N/A 09/24/2016   Procedure: Coronary Stent Intervention;  Surgeon: [name], MD;  Location: [facility] INVASIVE CV LAB;  Service: Cardiovascular;  Laterality: N/A;  . CARDIAC CATHETERIZATION N/A 09/24/2016   Procedure: Coronary/Graft Atherectomy;  Surgeon: [name], MD;  Location: [facility] INVASIVE CV LAB;  Service: Cardiovascular;  Laterality: N/A;  . CARDIAC CATHETERIZATION N/A 09/24/2016   Procedure: Temporary Pacemaker;  Surgeon: [name], MD;  Location: [facility]  INVASIVE CV LAB;  Service: Cardiovascular;  Laterality: N/A;  . CARDIOVASCULAR STRESS TEST  09/14/2016  . COLONOSCOPY    . CORONARY STENT PLACEMENT    . JOINT REPLACEMENT     left hip dr. Charlann Boxer 08-05-18  . LAPAROSCOPIC APPENDECTOMY N/A 07/14/2016   Procedure: APPENDECTOMY LAPAROSCOPIC;  Surgeon: Ovidio Kin, MD;  Location: [facility] ORS;  Service: General;  Laterality: N/A;  . TOTAL HIP ARTHROPLASTY     right 2012  . TOTAL HIP ARTHROPLASTY Left 08/05/2018   Procedure: LEFT TOTAL HIP ARTHROPLASTY ANTERIOR APPROACH;  Surgeon: Durene Romans, MD;  Location: [facility] ORS;  Service: Orthopedics;  Laterality: Left;  70 mins  . WISDOM TOOTH EXTRACTION      Current Outpatient Medications  Medication Sig Dispense Refill  . aspirin EC 81 MG tablet Take 81 mg by mouth every evening.     . calcium carbonate (TUMS - DOSED IN MG ELEMENTAL CALCIUM) 500 MG chewable tablet Chew 1 tablet by mouth daily as needed for heartburn.     . cetirizine (ZYRTEC) 10 MG tablet Take 10 mg by mouth daily as needed for allergies.    [name] clopidogrel (PLAVIX) 75 MG tablet Take 1 tablet (75 mg total) by mouth daily. Please make yearly appt with Dr. Clifton James for March for future refills. 1st attempt 90 tablet 0  . Coenzyme Q10 (COQ-10) 100 MG CAPS Take 100 mg by mouth 2 (two) times daily.    . Cyanocobalamin (RA VITAMIN B-12 TR) 1000 MCG TBCR Take 1,000 mcg by mouth daily.     [name]  diphenhydrAMINE (BENADRYL) 25 MG tablet Take 25 mg by mouth daily as needed for allergies.    [name] docusate sodium (COLACE) 100 MG capsule Take 1 capsule (100 mg total) by mouth 2 (two) times daily. 10 capsule 0  . esomeprazole (NEXIUM) 20 MG capsule Take 20 mg by mouth at bedtime.     . fenofibrate 160 MG tablet Take 160 mg by mouth daily.  0  . ferrous sulfate (FERROUSUL) 325 (65 FE) MG tablet Take 1 tablet (325 mg total) by mouth 3 (three) times daily with meals.  3  . fluticasone (FLONASE) 50 MCG/ACT nasal spray Place 1 spray into both nostrils daily as needed for  allergies or rhinitis.    . folic acid (FOLVITE) 400 MCG tablet Take 400 mcg by mouth daily.    [name] HYDROcodone-acetaminophen (NORCO) 7.5-325 MG tablet Take 1-2 tablets by mouth every 4 (four) hours as needed for moderate pain. 60 tablet 0  . irbesartan (AVAPRO) 150 MG tablet Take 1 tablet (150 mg total) by mouth daily. (Patient taking differently: Take 150 mg by mouth every evening. ) 90 tablet 3  . Ketotifen Fumarate (ALAWAY OP) Place 1 drop into both eyes daily as needed (allergies).    Boris Lown Oil 500 MG CAPS Take 500 mg by mouth daily.    . Melatonin 3 MG TABS Take 3 mg by mouth at bedtime as needed (sleep).    . methocarbamol (ROBAXIN) 500 MG tablet Take 1 tablet (500 mg total) by mouth every 6 (six) hours as needed for muscle spasms. 40 tablet 0  . metoprolol tartrate (LOPRESSOR) 25 MG tablet TAKE ONE TABLET BY MOUTH TWO TIMES A DAY 180 tablet 3  . montelukast (SINGULAIR) 10 MG tablet Take 10 mg by mouth daily as needed (allergies).   1  . niacin (SLO-NIACIN) 500 MG tablet Take 500 mg by mouth daily.    . nitroGLYCERIN (NITROSTAT) 0.4 MG SL tablet Place 1 tablet (0.4 mg total) under the tongue every 5 (five) minutes as needed. (Patient taking differently: Place 0.4 mg under the tongue every 5 (five) minutes as needed for chest pain. ) 25 tablet 5  . pantoprazole (PROTONIX) 40 MG tablet Take 1 tablet (40 mg total) by mouth daily. (Patient not taking: Reported on 07/23/2018) 10 tablet 0  . Plant Sterol Stanol-Pantethine (CHOLESTOFF COMPLETE) 300-100 MG CAPS Take 2 capsules by mouth every evening.     . polyethylene glycol (MIRALAX / GLYCOLAX) packet Take 17 g by mouth 2 (two) times daily. 14 each 0  . psyllium (REGULOID) 0.52 g capsule Take 2.6 g by mouth every evening.    . rosuvastatin (CRESTOR) 5 MG tablet Take 5 mg by mouth every Monday, Wednesday, and Friday.      No current facility-administered medications for this visit.     Allergies  Allergen Reactions  . Penicillins Swelling and  Other (See Comments)    Dyspnea, hair loss-childhood allergy Has patient had a PCN reaction causing immediate rash, facial/tongue/throat swelling, SOB or lightheadedness with hypotension:unsure Has patient had a PCN reaction causing severe rash involving mucus membranes or skin necrosis:unsure Has patient had a PCN reaction that required hospitalization:unsure Has patient had a PCN reaction occurring within the last 10 years:No If all of the above answers are "NO", then may proceed with Cephalosporin use.    . Lipitor [Atorvastatin Calcium]     LFT increased  . Rosuvastatin     In high doses caused levated LFT Joint muscle pain  Social History   Socioeconomic History  . Marital status: Married    Spouse name: Not on file  . Number of children: 2  . Years of education: Not on file  . Highest education level: Not on file  Occupational History  . Occupation: Chief Strategy Officer  . Financial resource strain: Not on file  . Food insecurity:    Worry: Not on file    Inability: Not on file  . Transportation needs:    Medical: Not on file    Non-medical: Not on file  Tobacco Use  . Smoking status: Former Smoker    Packs/day: 0.50    Years: 12.00    Pack years: 6.00    Types: Cigarettes    Last attempt to quit: 11/17/1978    Years since quitting: 40.2  . Smokeless tobacco: Never Used  Substance and Sexual Activity  . Alcohol use: Yes    Alcohol/week: 1.0 standard drinks    Types: 1 Glasses of wine per week    Comment: rare  . Drug use: No  . Sexual activity: Yes  Lifestyle  . Physical activity:    Days per week: Not on file    Minutes per session: Not on file  . Stress: Not on file  Relationships  . Social connections:    Talks on phone: Not on file    Gets together: Not on file    Attends religious service: Not on file    Active member of club or organization: Not on file    Attends meetings of clubs or organizations: Not on file    Relationship status: Not on  file  . Intimate partner violence:    Fear of current or ex partner: Not on file    Emotionally abused: Not on file    Physically abused: Not on file    Forced sexual activity: Not on file  Other Topics Concern  . Not on file  Social History Narrative  . Not on file    Family History  Problem Relation Age of Onset  . Coronary artery disease Mother 89  . CVA Mother   . Heart attack Father 36  . Microcephaly Paternal Uncle   . Heart attack Paternal Uncle     Review of Systems:  As stated in the HPI and otherwise negative.   There were no vitals taken for this visit.  Physical Examination:  General: Well developed, well nourished, NAD  HEENT: OP clear, mucus membranes moist  SKIN: warm, dry. No rashes. Neuro: No focal deficits  Musculoskeletal: Muscle strength 5/5 all ext  Psychiatric: Mood and affect normal  Neck: No JVD, no carotid bruits, no thyromegaly, no lymphadenopathy.  Lungs:Clear bilaterally, no wheezes, rhonci, crackles Cardiovascular: Regular rate and rhythm. No murmurs, gallops or rubs. Abdomen:Soft. Bowel sounds present. Non-tender.  Extremities: No lower extremity edema. Pulses are 2 + in the bilateral DP/PT.  Echo 05/20/17: - Left ventricle: The cavity size was normal. Systolic function was   normal. The estimated ejection fraction was in the range of 55%   to 60%. Wall motion was normal; there were no regional wall   motion abnormalities. Left ventricular diastolic function   parameters were normal.  EKG:  EKG is not *** ordered today. The ekg ordered today demonstrates   Recent Labs: 08/06/2018: BUN 26; Creatinine, Ser 1.62; Hemoglobin 10.8; Platelets 218; Potassium 4.5; Sodium 138   Lipid Pane Followed in primary care   Wt Readings from Last 3 Encounters:  08/05/18  172 lb (78 kg)  07/29/18 172 lb (78 kg)  01/22/18 176 lb 12.8 oz (80.2 kg)     Other studies Reviewed: Additional studies/ records that were reviewed today include: . Review of  the above records demonstrates:   Assessment and Plan:   1. CAD without angina: No chest pain. Will continue ASA, Plavix, beta blocker and statin. *** ow dose Crestor, 5 mg every day but is having some joint pain. This is managed in primary care.  We discussed Repatha or Praluent. He will research and let us know if he chooses to pursue this after discussion with DR. Futrell.  Current medicines are reviewed at length with the patient today.  The patient does not have concerns regarding medicines.  The following changes have been made:  no change  Labs/ tests ordered today include:   No orders of the defined types were placed in this encounter.  Disposition:   FU with me in 12  months  Signed, Verne Carrow, MD 01/21/2019 8:36 AM    [facility] [address] [place], [place], [place]  [address] Phone: [phone]; Fax: [phone]

## 2019-01-22 NOTE — Progress Notes (Signed)
Chief Complaint  Patient presents with  . Follow-up    CAD     History of Present Illness: 68 yo male with history of CAD, HTN, hyperlipidemia and GERD who is here today for cardiac follow up. He was evaluated in November 2017 by Dr. Donnie Aho for coronary calcification and had a stress test that showed ischemia. I performed a cardiac cath on 09/21/16 which showed a calcified 99% mid RCA stenosis treated with [facility] orbital atherectomy of the RCA followed by placement of a Synergy DES x 1 in the mid RCA. He did well following the procedure. Echo 05/20/17 with normal LV systolic function and no valve disease.  He is here today for follow up. The patient denies any chest pain, dyspnea, palpitations, lower extremity edema, orthopnea, PND, dizziness, near syncope or syncope. He has been exercising daily.    Primary Care Physician: Titus Dubin., MD  Past Medical History:  Diagnosis Date  . Arthritis   . Asthma    seasonal asthma as a child  . BPH (benign prostatic hypertrophy)   . Coronary artery disease    stent 2017  . GERD (gastroesophageal reflux disease)   . H/O seasonal allergies   . Hyperlipemia   . Hypertension   . Prostatitis     Past Surgical History:  Procedure Laterality Date  . APPENDECTOMY    . CARDIAC CATHETERIZATION N/A 09/21/2016   Procedure: Left Heart Cath and Coronary Angiography;  Surgeon: [name], MD;  Location: [facility] INVASIVE CV LAB;  Service: Cardiovascular;  Laterality: N/A;  . CARDIAC CATHETERIZATION N/A 09/24/2016   Procedure: Coronary Stent Intervention;  Surgeon: [name], MD;  Location: [facility] INVASIVE CV LAB;  Service: Cardiovascular;  Laterality: N/A;  . CARDIAC CATHETERIZATION N/A 09/24/2016   Procedure: Coronary/Graft Atherectomy;  Surgeon: [name], MD;  Location: [facility] INVASIVE CV LAB;  Service: Cardiovascular;  Laterality: N/A;  . CARDIAC CATHETERIZATION N/A 09/24/2016   Procedure: Temporary Pacemaker;   Surgeon: [name], MD;  Location: [facility] INVASIVE CV LAB;  Service: Cardiovascular;  Laterality: N/A;  . CARDIOVASCULAR STRESS TEST  09/14/2016  . COLONOSCOPY    . CORONARY STENT PLACEMENT    . JOINT REPLACEMENT     left hip dr. Charlann Boxer 08-05-18  . LAPAROSCOPIC APPENDECTOMY N/A 07/14/2016   Procedure: APPENDECTOMY LAPAROSCOPIC;  Surgeon: Ovidio Kin, MD;  Location: [facility] ORS;  Service: General;  Laterality: N/A;  . TOTAL HIP ARTHROPLASTY     right 2012  . TOTAL HIP ARTHROPLASTY Left 08/05/2018   Procedure: LEFT TOTAL HIP ARTHROPLASTY ANTERIOR APPROACH;  Surgeon: Durene Romans, MD;  Location: [facility] ORS;  Service: Orthopedics;  Laterality: Left;  70 mins  . WISDOM TOOTH EXTRACTION      Current Outpatient Medications  Medication Sig Dispense Refill  . aspirin EC 81 MG tablet Take 81 mg by mouth every evening.     . calcium carbonate (TUMS - DOSED IN MG ELEMENTAL CALCIUM) 500 MG chewable tablet Chew 1 tablet by mouth daily as needed for heartburn.     . cetirizine (ZYRTEC) 10 MG tablet Take 10 mg by mouth daily as needed for allergies.    . Coenzyme Q10 (COQ-10) 100 MG CAPS Take 100 mg by mouth 2 (two) times daily.    . Cyanocobalamin (RA VITAMIN B-12 TR) 1000 MCG TBCR Take 1,000 mcg by mouth daily.     . diphenhydrAMINE (BENADRYL) 25 MG tablet Take 25 mg by mouth daily as needed for allergies.    [name]  docusate sodium (COLACE) 100 MG capsule Take 1 capsule (100 mg total) by mouth 2 (two) times daily. 10 capsule 0  . fenofibrate 160 MG tablet Take 160 mg by mouth daily.  0  . fluticasone (FLONASE) 50 MCG/ACT nasal spray Place 1 spray into both nostrils daily as needed for allergies or rhinitis.    . folic acid (FOLVITE) 400 MCG tablet Take 400 mcg by mouth daily.    . irbesartan (AVAPRO) 150 MG tablet Take 1 tablet (150 mg total) by mouth daily. 90 tablet 3  . Ketotifen Fumarate (ALAWAY OP) Place 1 drop into both eyes daily as needed (allergies).    Boris Lown Oil 500 MG CAPS Take 500 mg by mouth  daily.    . Melatonin 3 MG TABS Take 3 mg by mouth at bedtime as needed (sleep).    . metoprolol tartrate (LOPRESSOR) 25 MG tablet TAKE ONE TABLET BY MOUTH TWO TIMES A DAY 180 tablet 3  . montelukast (SINGULAIR) 10 MG tablet Take 10 mg by mouth daily as needed (allergies).   1  . niacin (SLO-NIACIN) 500 MG tablet Take 500 mg by mouth daily.    . nitroGLYCERIN (NITROSTAT) 0.4 MG SL tablet Place 1 tablet (0.4 mg total) under the tongue every 5 (five) minutes as needed. 25 tablet 5  . Plant Sterol Stanol-Pantethine (CHOLESTOFF COMPLETE) 300-100 MG CAPS Take 2 capsules by mouth every evening.     . rosuvastatin (CRESTOR) 5 MG tablet Take 5 mg by mouth every Monday, Wednesday, and Friday.      No current facility-administered medications for this visit.     Allergies  Allergen Reactions  . Penicillins Swelling and Other (See Comments)    Dyspnea, hair loss-childhood allergy Has patient had a PCN reaction causing immediate rash, facial/tongue/throat swelling, SOB or lightheadedness with hypotension:unsure Has patient had a PCN reaction causing severe rash involving mucus membranes or skin necrosis:unsure Has patient had a PCN reaction that required hospitalization:unsure Has patient had a PCN reaction occurring within the last 10 years:No If all of the above answers are "NO", then may proceed with Cephalosporin use.    . Atorvastatin Calcium Other (See Comments)    LFT increased LFT increased  . Rosuvastatin     In high doses caused levated LFT Joint muscle pain      Social History   Socioeconomic History  . Marital status: Married    Spouse name: Not on file  . Number of children: 2  . Years of education: Not on file  . Highest education level: Not on file  Occupational History  . Occupation: Chief Strategy Officer  . Financial resource strain: Not on file  . Food insecurity:    Worry: Not on file    Inability: Not on file  . Transportation needs:    Medical: Not on file     Non-medical: Not on file  Tobacco Use  . Smoking status: Former Smoker    Packs/day: 0.50    Years: 12.00    Pack years: 6.00    Types: Cigarettes    Last attempt to quit: 11/17/1978    Years since quitting: 40.2  . Smokeless tobacco: Never Used  Substance and Sexual Activity  . Alcohol use: Yes    Alcohol/week: 1.0 standard drinks    Types: 1 Glasses of wine per week    Comment: rare  . Drug use: No  . Sexual activity: Yes  Lifestyle  . Physical activity:    Days per  week: Not on file    Minutes per session: Not on file  . Stress: Not on file  Relationships  . Social connections:    Talks on phone: Not on file    Gets together: Not on file    Attends religious service: Not on file    Active member of club or organization: Not on file    Attends meetings of clubs or organizations: Not on file    Relationship status: Not on file  . Intimate partner violence:    Fear of current or ex partner: Not on file    Emotionally abused: Not on file    Physically abused: Not on file    Forced sexual activity: Not on file  Other Topics Concern  . Not on file  Social History Narrative  . Not on file    Family History  Problem Relation Age of Onset  . Coronary artery disease Mother 49  . CVA Mother   . Heart attack Father 59  . Microcephaly Paternal Uncle   . Heart attack Paternal Uncle     Review of Systems:  As stated in the HPI and otherwise negative.   BP 98/60   Pulse (!) 53   Ht  (1.702 m)   Wt 174 lb 9.6 oz (79.2 kg)   SpO2 98%   BMI 27.35 kg/m   Physical Examination:  General: Well developed, well nourished, NAD  HEENT: OP clear, mucus membranes moist  SKIN: warm, dry. No rashes. Neuro: No focal deficits  Musculoskeletal: Muscle strength 5/5 all ext  Psychiatric: Mood and affect normal  Neck: No JVD, no carotid bruits, no thyromegaly, no lymphadenopathy.  Lungs:Clear bilaterally, no wheezes, rhonci, crackles Cardiovascular: Regular rate and rhythm. No  murmurs, gallops or rubs. Abdomen:Soft. Bowel sounds present. Non-tender.  Extremities: No lower extremity edema. Pulses are 2 + in the bilateral DP/PT.  Echo 05/20/17: - Left ventricle: The cavity size was normal. Systolic function was   normal. The estimated ejection fraction was in the range of 55%   to 60%. Wall motion was normal; there were no regional wall   motion abnormalities. Left ventricular diastolic function   parameters were normal.  EKG:  EKG is not ordered today. The ekg ordered today demonstrates Sinus brady, rate 69 bpm.   Recent Labs: 08/06/2018: BUN 26; Creatinine, Ser 1.62; Hemoglobin 10.8; Platelets 218; Potassium 4.5; Sodium 138   Lipid Pane Followed in primary care   Wt Readings from Last 3 Encounters:  01/23/19 174 lb 9.6 oz (79.2 kg)  08/05/18 172 lb (78 kg)  07/29/18 172 lb (78 kg)     Other studies Reviewed: Additional studies/ records that were reviewed today include: . Review of the above records demonstrates:   Assessment and Plan:   1. CAD without angina: He has done well following his PCI/stenting of the RCA in November 2017. No chest pain. Continue ASA, statin and beta blocker. Will stop Plavix.   2. Hyperlipidemia: LDL at 119 January 2020 in primary care. He has only tolerated low doses of statins. Will refer to lipid clinic to discuss Repatha or Praluent.   Current medicines are reviewed at length with the patient today.  The patient does not have concerns regarding medicines.  The following changes have been made:  no change  Labs/ tests ordered today include:   Orders Placed This Encounter  Procedures  . AMB Referral to [facility]  . EKG 12-Lead   Disposition:   FU  with me in 10-14  months  Signed, Verne Carrow, MD 01/23/2019 10:12 AM    [facility] [address] [place], [place], [place]  [address] Phone: [phone]; Fax: [phone]

## 2019-01-23 ENCOUNTER — Other Ambulatory Visit: Payer: Self-pay

## 2019-01-23 ENCOUNTER — Encounter (HOSPITAL_COMMUNITY)
Admission: RE | Admit: 2019-01-23 | Discharge: 2019-01-23 | Disposition: A | Discharge status: Home / Self Care | Payer: Medicare Other | Source: Ambulatory Visit | Attending: Cardiovascular Disease | Admitting: Cardiovascular Disease

## 2019-01-23 ENCOUNTER — Encounter: Payer: Self-pay | Admitting: Cardiovascular Disease

## 2019-01-23 ENCOUNTER — Ambulatory Visit (INDEPENDENT_AMBULATORY_CARE_PROVIDER_SITE_OTHER): Payer: Medicare Other | Admitting: Cardiovascular Disease

## 2019-01-23 VITALS — BP 98/60 | HR 53 | Ht 67.0 in | Wt 174.6 lb

## 2019-01-23 DIAGNOSIS — I2510 Atherosclerotic heart disease of native coronary artery without angina pectoris: Secondary | ICD-10-CM

## 2019-01-23 DIAGNOSIS — E7800 Pure hypercholesterolemia, unspecified: Secondary | ICD-10-CM | POA: Diagnosis not present

## 2019-01-23 NOTE — Patient Instructions (Signed)
Medication Instructions:  Your physician has recommended you make the following change in your medication: Stop Clopidogrel  If you need a refill on your cardiac medications before your next appointment, please call your pharmacy.   Lab work: none If you have labs (blood work) drawn today and your tests are completely normal, you will receive your results only by: [name] MyChart Message (if you have MyChart) OR . A paper copy in the mail If you have any lab test that is abnormal or we need to change your treatment, we will call you to review the results.  Testing/Procedures: You have been referred to lipid clinic in the [facility] office.  Please schedule new patient appointment for patient   Follow-Up: At [facility], you and your health needs are our priority.  As part of our continuing mission to provide you with exceptional heart care, we have created designated Provider Care Teams.  These Care Teams include your primary Cardiologist (physician) and Advanced Practice Providers (APPs -  Physician Assistants and Nurse Practitioners) who all work together to provide you with the care you need, when you need it. You will need a follow up appointment in 10-14 months.  Please call our office 2 months in advance to schedule this appointment.  You may see Verne Carrow, MD or one of the following Advanced Practice Providers on your designated Care Team:   [place], PA-C Ronie Spies, PA-C . Jacolyn Reedy, PA-C  Any Other Special Instructions Will Be Listed Below (If Applicable).

## 2019-01-26 ENCOUNTER — Telehealth (HOSPITAL_COMMUNITY): Payer: Self-pay | Admitting: Cardiac Rehabilitation

## 2019-01-26 ENCOUNTER — Encounter (HOSPITAL_COMMUNITY): Payer: Self-pay

## 2019-01-26 NOTE — Telephone Encounter (Signed)
Phone call to inform pt of outpatient Cardiac Rehab 2 week closure.  LM on answering machine. Deveron Furlong, RN, BSN Cardiac Pulmonary Rehab

## 2019-01-28 ENCOUNTER — Encounter (HOSPITAL_COMMUNITY): Payer: Self-pay

## 2019-01-28 ENCOUNTER — Telehealth: Payer: Self-pay | Admitting: Pharmacist

## 2019-01-28 NOTE — Telephone Encounter (Signed)
Called patient and offered to discuss cholesterol treatment over the phone due to [place] outbreak. Will call patient on 01/30/2019 @ 0730

## 2019-01-30 ENCOUNTER — Telehealth: Payer: Self-pay | Admitting: Pharmacist

## 2019-01-30 ENCOUNTER — Encounter (HOSPITAL_COMMUNITY): Payer: Self-pay

## 2019-01-30 DIAGNOSIS — E7800 Pure hypercholesterolemia, unspecified: Secondary | ICD-10-CM

## 2019-01-30 MED ORDER — EZETIMIBE 10 MG PO TABS: 10.0000 mg | ORAL_TABLET | Freq: Every day | ORAL | 3 refills | Status: DC

## 2019-01-30 NOTE — Telephone Encounter (Signed)
Patient ID: Sean Schultz.                 DOB: 01-09-1951                    MRN: [id number]     HPI: Sean Schultz. is a 68 y.o. male patient referred to lipid clinic by [facility]. PMH is significant for CAD, HTN, hyperlipidemia and GERD.   Current Medications: rosuvastatin 5mg  daily, fenofibrate 160mg  daily, krill oils Intolerances: rosuvastatin 5mg  daily (muscle pains), atorvastatin (elevated LFT), cholestyramine Risk Factors: CAD LDL goal: <70  Diet: B:smoked salmon, prushito w/ rosemary olive bread, low fat cheese L: doesn't usually eat lunch D: stays away from fried foods, chicken, salmon, not much beef, pork. Roasted veggies  Exercise: gym 3 times a week- walk, stair step, ride bike (cardiac rehab maintenance)  Family History: father had MI at 31, mother had bypass at 35, aunts and uncles with premature cardiovascular disease.  Social History: very little ETOH, neg tobacco (former smoker)  Labs: 12/04/2018 TC 184, HDL 36, LDL 119, TG 222 (rosuvastatin 5mg  three times a week)  Past Medical History:  Diagnosis Date  . Arthritis   . Asthma    seasonal asthma as a child  . BPH (benign prostatic hypertrophy)   . Coronary artery disease    stent 2017  . GERD (gastroesophageal reflux disease)   . H/O seasonal allergies   . Hyperlipemia   . Hypertension   . Prostatitis     Current Outpatient Medications on File Prior to Visit  Medication Sig Dispense Refill  . aspirin EC 81 MG tablet Take 81 mg by mouth every evening.     . calcium carbonate (TUMS - DOSED IN MG ELEMENTAL CALCIUM) 500 MG chewable tablet Chew 1 tablet by mouth daily as needed for heartburn.     . cetirizine (ZYRTEC) 10 MG tablet Take 10 mg by mouth daily as needed for allergies.    . Coenzyme Q10 (COQ-10) 100 MG CAPS Take 100 mg by mouth 2 (two) times daily.    . Cyanocobalamin (RA VITAMIN B-12 TR) 1000 MCG TBCR Take 1,000 mcg by mouth daily.     . diphenhydrAMINE (BENADRYL) 25 MG tablet Take 25  mg by mouth daily as needed for allergies.    [name] docusate sodium (COLACE) 100 MG capsule Take 1 capsule (100 mg total) by mouth 2 (two) times daily. 10 capsule 0  . fenofibrate 160 MG tablet Take 160 mg by mouth daily.  0  . fluticasone (FLONASE) 50 MCG/ACT nasal spray Place 1 spray into both nostrils daily as needed for allergies or rhinitis.    . folic acid (FOLVITE) 400 MCG tablet Take 400 mcg by mouth daily.    . irbesartan (AVAPRO) 150 MG tablet Take 1 tablet (150 mg total) by mouth daily. 90 tablet 3  . Ketotifen Fumarate (ALAWAY OP) Place 1 drop into both eyes daily as needed (allergies).    Boris Lown Oil 500 MG CAPS Take 500 mg by mouth daily.    . Melatonin 3 MG TABS Take 3 mg by mouth at bedtime as needed (sleep).    . metoprolol tartrate (LOPRESSOR) 25 MG tablet TAKE ONE TABLET BY MOUTH TWO TIMES A DAY 180 tablet 3  . montelukast (SINGULAIR) 10 MG tablet Take 10 mg by mouth daily as needed (allergies).   1  . niacin (SLO-NIACIN) 500 MG tablet Take 500 mg by mouth daily.    [name]  nitroGLYCERIN (NITROSTAT) 0.4 MG SL tablet Place 1 tablet (0.4 mg total) under the tongue every 5 (five) minutes as needed. 25 tablet 5  . Plant Sterol Stanol-Pantethine (CHOLESTOFF COMPLETE) 300-100 MG CAPS Take 2 capsules by mouth every evening.     . rosuvastatin (CRESTOR) 5 MG tablet Take 5 mg by mouth every Monday, Wednesday, and Friday.      No current facility-administered medications on file prior to visit.     Allergies  Allergen Reactions  . Penicillins Swelling and Other (See Comments)    Dyspnea, hair loss-childhood allergy Has patient had a PCN reaction causing immediate rash, facial/tongue/throat swelling, SOB or lightheadedness with hypotension:unsure Has patient had a PCN reaction causing severe rash involving mucus membranes or skin necrosis:unsure Has patient had a PCN reaction that required hospitalization:unsure Has patient had a PCN reaction occurring within the last 10 years:No If all of  the above answers are "NO", then may proceed with Cephalosporin use.    . Atorvastatin Calcium Other (See Comments)    LFT increased LFT increased  . Rosuvastatin     In high doses caused levated LFT Joint muscle pain      Assessment/Plan:  1. Hyperlipidemia - LDL above goal of <70. Patient's last labs was on rosuvastatin 5mg  three times a week. He has since increased to daily without any issues thus far. However, patient was on rosuvastatin 5mg  daily in the past and LDL was never at goal. Discussed PCSK9 inhibitors with patient. Patient does not want to do an injection if possible. Offered to try zetia first, and if he is not at goal in 3 months, will revisit PCSK9 inhibitors. Patient agreeable to the plan. Will start zetia 10mg  daily.  Also discussed Vascepa and its cardiovascular benefit. However, it appears as though it might be cost prohibitive. I also think it is more important to get patient on Reptha than Vascepa at this time. Will revisit this at another visit   Thank you,  Olene Floss, Pharm.D, BCPS [facility]  [address] [place], [place] [address]  Phone: [phone]; Fax: [phone]

## 2019-02-02 ENCOUNTER — Ambulatory Visit: Payer: Medicare Other

## 2019-02-02 ENCOUNTER — Encounter (HOSPITAL_COMMUNITY): Payer: Self-pay

## 2019-02-03 ENCOUNTER — Telehealth (HOSPITAL_COMMUNITY): Payer: Self-pay | Admitting: *Deleted

## 2019-02-04 ENCOUNTER — Encounter (HOSPITAL_COMMUNITY): Payer: Self-pay

## 2019-02-06 ENCOUNTER — Encounter (HOSPITAL_COMMUNITY): Payer: Self-pay

## 2019-02-09 ENCOUNTER — Encounter (HOSPITAL_COMMUNITY): Payer: Self-pay

## 2019-02-11 ENCOUNTER — Encounter (HOSPITAL_COMMUNITY): Payer: 59

## 2019-02-13 ENCOUNTER — Encounter (HOSPITAL_COMMUNITY): Payer: 59

## 2019-02-16 ENCOUNTER — Encounter (HOSPITAL_COMMUNITY): Payer: 59

## 2019-02-18 ENCOUNTER — Encounter (HOSPITAL_COMMUNITY): Payer: 59

## 2019-02-20 ENCOUNTER — Encounter (HOSPITAL_COMMUNITY): Payer: 59

## 2019-02-23 ENCOUNTER — Encounter (HOSPITAL_COMMUNITY): Payer: 59

## 2019-02-25 ENCOUNTER — Encounter (HOSPITAL_COMMUNITY): Payer: 59

## 2019-02-27 ENCOUNTER — Encounter (HOSPITAL_COMMUNITY): Payer: 59

## 2019-03-02 ENCOUNTER — Encounter (HOSPITAL_COMMUNITY): Payer: 59

## 2019-03-02 ENCOUNTER — Ambulatory Visit: Payer: Medicare Other | Admitting: Cardiovascular Disease

## 2019-03-04 ENCOUNTER — Encounter (HOSPITAL_COMMUNITY): Payer: 59

## 2019-03-06 ENCOUNTER — Encounter (HOSPITAL_COMMUNITY): Payer: 59

## 2019-03-09 ENCOUNTER — Encounter (HOSPITAL_COMMUNITY): Payer: 59

## 2019-03-11 ENCOUNTER — Encounter (HOSPITAL_COMMUNITY): Payer: 59

## 2019-03-16 ENCOUNTER — Other Ambulatory Visit: Payer: Self-pay | Admitting: Cardiovascular Disease

## 2019-04-13 ENCOUNTER — Telehealth: Payer: Self-pay | Admitting: Cardiovascular Disease

## 2019-04-13 NOTE — Telephone Encounter (Signed)
Patient had labwork done at [facility] on 5/27. However I cannot see them in [facility] or [facility]where. Patient will send them to me and I will call patient after 10:00 tomorrow to discuss.

## 2019-04-13 NOTE — Telephone Encounter (Signed)
New Message              Patient is calling to see if he can use his previous labs instead of coming in on 06/17, Pls call to advise. This is for  [facility] Lipid clinic

## 2019-04-14 ENCOUNTER — Telehealth: Payer: Self-pay | Admitting: Pharmacist

## 2019-04-14 DIAGNOSIS — E7800 Pure hypercholesterolemia, unspecified: Secondary | ICD-10-CM

## 2019-04-14 DIAGNOSIS — I2510 Atherosclerotic heart disease of native coronary artery without angina pectoris: Secondary | ICD-10-CM

## 2019-04-14 NOTE — Telephone Encounter (Signed)
Thanks!    Sean Schultz.

## 2019-04-14 NOTE — Telephone Encounter (Signed)
Called and reviewed lipid results with patient  Lipid panel from 04/08/2019 LDL direct 51 mg/dl TC 092 TG 85 HDL 37  LDL at goal of <70 Continue zetia 10mg  daily, rosuvastatin 5mg  daily and fenofibrate 160mg  daily  Patients only complaint was the cost. Zetia is a tier 3 on his insurance. We found a GoodRx coupon for Karin Golden that will bring the cost down to $21.21 for a 90 day supply.  Follow up with labs in 6 months per patient request

## 2019-04-15 ENCOUNTER — Other Ambulatory Visit: Payer: Self-pay | Admitting: Cardiovascular Disease

## 2019-04-15 NOTE — Telephone Encounter (Signed)
Fasting lipid and liver profiles scheduled for December 1,2020.  Pt aware

## 2019-04-15 NOTE — Addendum Note (Signed)
Addended by: Dossie Arbour on: 04/15/2019 10:58 AM   Modules accepted: Orders

## 2019-04-29 ENCOUNTER — Other Ambulatory Visit: Payer: Medicare Other

## 2019-05-12 ENCOUNTER — Telehealth (HOSPITAL_COMMUNITY): Payer: Self-pay

## 2019-07-09 ENCOUNTER — Other Ambulatory Visit: Payer: Self-pay | Admitting: Cardiovascular Disease

## 2019-07-22 ENCOUNTER — Telehealth: Payer: Self-pay | Admitting: Cardiovascular Disease

## 2019-07-22 NOTE — Telephone Encounter (Signed)
Pt c/o of Chest Pain: STAT if CP now or developed within 24 hours  1. Are you having CP right now? Not pain, chest feel weird- not at this moment  2. Are you experiencing any other symptoms (ex. SOB, nausea, vomiting, sweating)? no  3. How long have you been experiencing CP? About 2 weeksno pain, just feel weird  4. Is your CP continuous or coming and going? Coming and going  5. Have you taken Nitroglycerin?  No- pt wants to be seen?

## 2019-07-22 NOTE — Telephone Encounter (Signed)
Pt reports intermittent "chest tightness" that comes and goes randomly but does not feel like the discomfort is bad enough for him to take the Nitroglycerin. He is concerned and would really like to be evaluated in office to rule out anything. He denies N/V or diaphoresis. He has not taken BP or HR. Pt advised to call back if any new symptoms present. Pt scheduled for appt on 9/11

## 2019-07-24 ENCOUNTER — Ambulatory Visit (INDEPENDENT_AMBULATORY_CARE_PROVIDER_SITE_OTHER): Payer: Medicare Other | Admitting: Cardiology

## 2019-07-24 ENCOUNTER — Encounter: Payer: Self-pay | Admitting: Cardiology

## 2019-07-24 ENCOUNTER — Other Ambulatory Visit: Payer: Self-pay

## 2019-07-24 VITALS — BP 102/48 | HR 67 | Ht 67.0 in | Wt 166.0 lb

## 2019-07-24 DIAGNOSIS — R079 Chest pain, unspecified: Secondary | ICD-10-CM | POA: Diagnosis not present

## 2019-07-24 DIAGNOSIS — I2510 Atherosclerotic heart disease of native coronary artery without angina pectoris: Secondary | ICD-10-CM | POA: Diagnosis not present

## 2019-07-24 DIAGNOSIS — I10 Essential (primary) hypertension: Secondary | ICD-10-CM

## 2019-07-24 DIAGNOSIS — E785 Hyperlipidemia, unspecified: Secondary | ICD-10-CM

## 2019-07-24 NOTE — Progress Notes (Signed)
Cardiology Office Note:    Date:  07/24/2019   ID:  Sean Hero., DOB Jul 16, 1951, MRN [id number]  PCP:  Beryle Quant, MD  Cardiologist:  Lauree Chandler, MD  Referring MD: Blythe Stanford., MD   Chief Complaint  Patient presents with   Chest Pain    History of Present Illness:    Sean [place]. is a 68 y.o. male with a past medical history significant for CAD, hypertension, hyperlipidemia and GERD.  He was evaluated in November 2017 by Dr. Wynonia Lawman for coronary calcification and had a stress test that showed ischemia. Cardiac cath on 09/21/16 showed a calcified 99% mid RCA stenosis treated with [facility] orbital atherectomy of the RCA followed by placement of a Synergy DES x 1 in the mid RCA. He did well following the procedure. Echo 05/20/17 with normal LV systolic function and no valve disease.  The patient was last seen in the office on 01/23/2019 by Dr. Angelena Form and was doing well without any chest pain.  He was continued on aspirin, statin and beta-blocker.  His Plavix was discontinued.  His LDL was above goal on maximally tolerated statins.  He was referred to the lipid clinic.  He was started on Zetia with plan to recheck lipids in 3 months and then consider PCSK9 inhibitor if not to goal.  Follow-up lipids 04/08/2019 with LDL of 51, at goal on Zetia 10 mg, rosuvastatin 5 mg daily and fenofibrate 160 mg daily.  The patient was added to my schedule for evaluation of chest pain.  He is here today alone.  He says that he was working at his daughter's house recently helping her move when he developed chest heaviness as best he can describe.  He has trouble describing the exact feeling.  This seems to be constant and does not radiate.  It is not reproducible with palpation or movement.  He has no associated shortness of breath, lightheadedness, nausea or any other symptoms.   The patient is diligent about doing his cardio 30 minutes 6 days/week, brisk walking.   He has no chest discomfort or shortness of breath with that activity.  He denies any palpitations, orthopnea, PND or edema.     No leg pain with walking.   Past Medical History:  Diagnosis Date   Arthritis    Asthma    seasonal asthma as a child   BPH (benign prostatic hypertrophy)    Coronary artery disease    stent 2017   GERD (gastroesophageal reflux disease)    H/O seasonal allergies    Hyperlipemia    Hypertension    Prostatitis     Past Surgical History:  Procedure Laterality Date   APPENDECTOMY     CARDIAC CATHETERIZATION N/A 09/21/2016   Procedure: Left Heart Cath and Coronary Angiography;  Surgeon: Burnell Blanks, MD;  Location: [place] [facility];  Service: Cardiovascular;  Laterality: N/A;   CARDIAC CATHETERIZATION N/A 09/24/2016   Procedure: Coronary Stent Intervention;  Surgeon: Burnell Blanks, MD;  Location: [place] [facility];  Service: Cardiovascular;  Laterality: N/A;   CARDIAC CATHETERIZATION N/A 09/24/2016   Procedure: Coronary/Graft Atherectomy;  Surgeon: Burnell Blanks, MD;  Location: [place] [facility];  Service: Cardiovascular;  Laterality: N/A;   CARDIAC CATHETERIZATION N/A 09/24/2016   Procedure: Temporary Pacemaker;  Surgeon: Burnell Blanks, MD;  Location: [place] [facility];  Service: Cardiovascular;  Laterality: N/A;   CARDIOVASCULAR STRESS TEST  09/14/2016   COLONOSCOPY  CORONARY STENT PLACEMENT     JOINT REPLACEMENT     left hip dr. Charlann Boxerlin 08-05-18   LAPAROSCOPIC APPENDECTOMY N/A 07/14/2016   Procedure: APPENDECTOMY LAPAROSCOPIC;  Surgeon: Ovidio Kinavid Newman, MD;  Location: [facility];  Service: General;  Laterality: N/A;   TOTAL HIP ARTHROPLASTY     right 2012   TOTAL HIP ARTHROPLASTY Left 08/05/2018   Procedure: LEFT TOTAL HIP ARTHROPLASTY ANTERIOR APPROACH;  Surgeon: Durene Romanslin, Matthew, MD;  Location: [facility];  Service: Orthopedics;  Laterality: Left;  70 mins   WISDOM TOOTH EXTRACTION      Current  Medications: Current Meds  Medication Sig   aspirin EC 81 MG tablet Take 81 mg by mouth every evening.    calcium carbonate (TUMS - DOSED IN MG ELEMENTAL CALCIUM) 500 MG chewable tablet Chew 1 tablet by mouth daily as needed for heartburn.    cetirizine (ZYRTEC) 10 MG tablet Take 10 mg by mouth daily as needed for allergies.   cholecalciferol (VITAMIN D3) 25 MCG (1000 UT) tablet Take 1,000 Units by mouth daily.   Coenzyme Q10 (COQ-10) 100 MG CAPS Take 100 mg by mouth 2 (two) times daily.   Cyanocobalamin (RA VITAMIN B-12 TR) 1000 MCG TBCR Take 1,000 mcg by mouth daily.    diphenhydrAMINE (BENADRYL) 25 MG tablet Take 25 mg by mouth daily as needed for allergies.   docusate sodium (COLACE) 100 MG capsule Take 1 capsule (100 mg total) by mouth 2 (two) times daily.   ezetimibe (ZETIA) 10 MG tablet Take 1 tablet (10 mg total) by mouth daily.   fenofibrate 160 MG tablet Take 160 mg by mouth daily.   fluticasone (FLONASE) 50 MCG/ACT nasal spray Place 1 spray into both nostrils daily as needed for allergies or rhinitis.   folic acid (FOLVITE) 400 MCG tablet Take 400 mcg by mouth daily.   irbesartan (AVAPRO) 150 MG tablet TAKE ONE TABLET BY MOUTH DAILY   Ketotifen Fumarate (ALAWAY OP) Place 1 drop into both eyes daily as needed (allergies).   Krill Oil 500 MG CAPS Take 500 mg by mouth daily.   Melatonin 3 MG TABS Take 3 mg by mouth at bedtime as needed (sleep).   metoprolol tartrate (LOPRESSOR) 25 MG tablet TAKE ONE TABLET BY MOUTH TWO TIMES A DAY   montelukast (SINGULAIR) 10 MG tablet Take 10 mg by mouth daily as needed (allergies).    niacin (SLO-NIACIN) 500 MG tablet Take 500 mg by mouth daily.   nitroGLYCERIN (NITROSTAT) 0.4 MG SL tablet Place 1 tablet (0.4 mg total) under the tongue every 5 (five) minutes as needed.   Plant Sterol Stanol-Pantethine (CHOLESTOFF COMPLETE) 300-100 MG CAPS Take 2 capsules by mouth every evening.    psyllium (REGULOID) 0.52 g capsule Take 0.52 g  by mouth daily.   rosuvastatin (CRESTOR) 5 MG tablet Take 5 mg by mouth every Monday, Wednesday, and Friday.      Allergies:   Penicillins, Atorvastatin calcium, and Rosuvastatin   Social History   Socioeconomic History   Marital status: Married    Spouse name: Not on file   Number of children: 2   Years of education: Not on file   Highest education level: Not on file  Occupational History   Occupation: Designer, [employer]  Social Needs   Financial resource strain: Not on file   Food insecurity    Worry: Not on file    Inability: Not on file   Transportation needs    Medical: Not on file    Non-medical: Not  on file  Tobacco Use   Smoking status: Former Smoker    Packs/day: 0.50    Years: 12.00    Pack years: 6.00    Types: Cigarettes    Quit date: 11/17/1978    Years since quitting: 40.7   Smokeless tobacco: Never Used  Substance and Sexual Activity   Alcohol use: Yes    Alcohol/week: 1.0 standard drinks    Types: 1 Glasses of wine per week    Comment: rare   Drug use: No   Sexual activity: Yes  Lifestyle   Physical activity    Days per week: Not on file    Minutes per session: Not on file   Stress: Not on file  Relationships   Social connections    Talks on phone: Not on file    Gets together: Not on file    Attends religious service: Not on file    Active member of club or organization: Not on file    Attends meetings of clubs or organizations: Not on file    Relationship status: Not on file  Other Topics Concern   Not on file  Social History Narrative   Not on file     Family History: The patient's family history includes CVA in his mother; Coronary artery disease (age of onset: 63) in his mother; Heart attack in his paternal uncle; Heart attack (age of onset: 73) in his father; Microcephaly in his paternal uncle. ROS:   Please see the history of present illness.     All other systems reviewed and are negative.  EKGs/Labs/Other Studies Reviewed:     The following studies were reviewed today:  Echo 05/20/17: - Left ventricle: The cavity size was normal. Systolic function was normal. The estimated ejection fraction was in the range of 55% to 60%. Wall motion was normal; there were no regional wall motion abnormalities. Left ventricular diastolic function parameters were normal.  LHC 09/24/16 Conclusion   Mid RCA-2 lesion, 99 %stenosed.  A STENT SYNERGY DES [id number] drug eluting stent was successfully placed.  Mid RCA-1 lesion, 60 %stenosed.  Post intervention, there is a 0% residual stenosis.   1. Severe calcified mid to distal RCA stenosis.  2. Successful PTCA/atherectomy/DES placement mid to distal RCA  Recommendations: Continue DAPT with ASA and Plavix for at least one year. Continue statin. Consider beta blocker therapy if BP tolerates.     EKG:  EKG is ordered today.  The ekg ordered today demonstrates sinus bradycardia, 58 bpm, no ischemic changes  Recent Labs: 08/06/2018: BUN 26; Creatinine, Ser 1.62; Hemoglobin 10.8; Platelets 218; Potassium 4.5; Sodium 138   Recent Lipid Panel No results found for: CHOL, TRIG, HDL, CHOLHDL, VLDL, LDLCALC, LDLDIRECT  Physical Exam:    VS:  BP (!) 102/48    Pulse 67    Ht 5\' 7"  (1.702 m)    Wt 166 lb (75.3 kg)    SpO2 98%    BMI 26.00 kg/m     Wt Readings from Last 3 Encounters:  07/24/19 166 lb (75.3 kg)  01/23/19 174 lb 9.6 oz (79.2 kg)  08/05/18 172 lb (78 kg)     Physical Exam  Constitutional: He is oriented to person, place, and time. He appears well-developed and well-nourished. No distress.  HENT:  Head: Normocephalic and atraumatic.  Neck: Normal range of motion. Neck supple. No JVD present.  Cardiovascular: Normal rate, regular rhythm, normal heart sounds and intact distal pulses. Exam reveals no gallop and no friction rub.  No murmur heard. Pulmonary/Chest: Effort normal and breath sounds normal. No respiratory distress. He has no wheezes. He has no  rales.  Abdominal: Soft. Bowel sounds are normal.  Musculoskeletal: Normal range of motion.        General: No edema.  Neurological: He is alert and oriented to person, place, and time.  Skin: Skin is warm and dry.  Psychiatric: He has a normal mood and affect. His behavior is normal. Judgment and thought content normal.  Vitals reviewed.    ASSESSMENT:    1. CAD in native artery   2. Chest pain, unspecified type   3. Hyperlipidemia, unspecified hyperlipidemia type   4. Essential (primary) hypertension    PLAN:    In order of problems listed above:  Chest pain -Patient notes a chest heaviness that began when he was helping his daughter move.  EKG is without ischemic changes.  He does regular exercise without any exertional chest pain or shortness of breath.  His symptoms are likely musculoskeletal but with his history and his concern, he agrees to check a Myoview.  -If Myoview low risk we will do nothing.  If it is abnormal will need to consider cardiac cath. -Reviewed chest pain protocol, use of nitroglycerin and when to call EMS.  CAD -History of DES to RCA in 09/2016.  Patient continues on aspirin, statin, beta-blocker.  Plavix was discontinued.  Hyperlipidemia, Goal LDL <70 -Per lipid clinic patient was started on Zetia in addition to his rosuvastatin. Follow-up lipids 04/08/2019 with LDL of 51, at goal on Zetia 10 mg, rosuvastatin 5 mg daily and fenofibrate 160 mg daily.  Hypertension -On irbesartan 150 mg daily, Lopressor 25 mg twice daily -Blood pressure is well controlled.   Medication Adjustments/Labs and Tests Ordered: Current medicines are reviewed at length with the patient today.  Concerns regarding medicines are outlined above. Labs and tests ordered and medication changes are outlined in the patient instructions below:  Patient Instructions  Medication Instructions:  Your physician recommends that you continue on your current medications as directed. Please  refer to the Current Medication list given to you today.  If you need a refill on your cardiac medications before your next appointment, please call your pharmacy.   Lab work: None   If you have labs (blood work) drawn today and your tests are completely normal, you will receive your results only by:  MyChart Message (if you have MyChart) OR  A paper copy in the mail If you have any lab test that is abnormal or we need to change your treatment, we will call you to review the results.  Testing/Procedures: Your physician has requested that you have a lexiscan myoview. For further information please visit [email or web address]. Please follow instruction sheet, as given.   Follow-Up: At [facility], you and your health needs are our priority.  As part of our continuing mission to provide you with exceptional heart care, we have created designated Provider Care Teams.  These Care Teams include your primary Cardiologist (physician) and Advanced Practice Providers (APPs -  Physician Assistants and Nurse Practitioners) who all work together to provide you with the care you need, when you need it. You will need a follow up appointment in 6 months.  Please call our office 2 months in advance to schedule this appointment.  You may see Verne Carrowhristopher McAlhany, MD or one of the following Advanced Practice Providers on your designated Care Team:   Robbie LisBrittainy Simmons, PA-C Dayna Dunn, PA-C  Jacolyn ReedyMichele Lenze, PA-C  Any Other Special Instructions Will Be Listed Below (If Applicable).       Signed, Berton Bon, NP  07/24/2019 6:28 PM    [facility]

## 2019-07-24 NOTE — Patient Instructions (Signed)
Medication Instructions:  Your physician recommends that you continue on your current medications as directed. Please refer to the Current Medication list given to you today.  If you need a refill on your cardiac medications before your next appointment, please call your pharmacy.   Lab work: None If you have labs (blood work) drawn today and your tests are completely normal, you will receive your results only by: . MyChart Message (if you have MyChart) OR . A paper copy in the mail If you have any lab test that is abnormal or we need to change your treatment, we will call you to review the results.  Testing/Procedures: Your physician has requested that you have a lexiscan myoview. For further information please visit [email or web address]. Please follow instruction sheet, as given.   Follow-Up: At [facility], you and your health needs are our priority.  As part of our continuing mission to provide you with exceptional heart care, we have created designated Provider Care Teams.  These Care Teams include your primary Cardiologist (physician) and Advanced Practice Providers (APPs -  Physician Assistants and Nurse Practitioners) who all work together to provide you with the care you need, when you need it. You will need a follow up appointment in 6 months.  Please call our office 2 months in advance to schedule this appointment.  You may see Christopher McAlhany, MD or one of the following Advanced Practice Providers on your designated Care Team:   Brittainy Simmons, PA-C Dayna Dunn, PA-C . Michele Lenze, PA-C  Any Other Special Instructions Will Be Listed Below (If Applicable).    

## 2019-07-28 ENCOUNTER — Telehealth (HOSPITAL_COMMUNITY): Payer: Self-pay

## 2019-07-28 NOTE — Telephone Encounter (Signed)
Spoke with the patient, instructions given. He stated that he understood and would be here for his test. Asked to call back with any questions. [name] EMTP 

## 2019-07-30 ENCOUNTER — Other Ambulatory Visit: Payer: Self-pay

## 2019-07-30 ENCOUNTER — Ambulatory Visit (HOSPITAL_COMMUNITY): Payer: Medicare Other | Attending: Cardiovascular Disease

## 2019-07-30 DIAGNOSIS — R079 Chest pain, unspecified: Secondary | ICD-10-CM | POA: Diagnosis not present

## 2019-07-30 LAB — MYOCARDIAL PERFUSION IMAGING
LV dias vol: 78 mL (ref 62–150)
LV sys vol: 33 mL
Peak HR: 78 {beats}/min
Rest HR: 51 {beats}/min
SDS: 1
SRS: 0
SSS: 1
TID: 1.1

## 2019-07-30 MED ORDER — TECHNETIUM TC 99M TETROFOSMIN IV KIT
31.4000 | PACK | Freq: Once | INTRAVENOUS | Status: AC | PRN
  Administered 2019-07-30: 31.4 via INTRAVENOUS
  Filled 2019-07-30: qty 32

## 2019-07-30 MED ORDER — REGADENOSON 0.4 MG/5ML IV SOLN
0.4000 mg | Freq: Once | INTRAVENOUS | Status: AC
  Administered 2019-07-30: 0.4 mg via INTRAVENOUS

## 2019-07-30 MED ORDER — TECHNETIUM TC 99M TETROFOSMIN IV KIT
10.2000 | PACK | Freq: Once | INTRAVENOUS | Status: AC | PRN
  Administered 2019-07-30: 10.2 via INTRAVENOUS
  Filled 2019-07-30: qty 11

## 2019-08-19 ENCOUNTER — Telehealth: Payer: Self-pay

## 2019-08-19 MED ORDER — NITROGLYCERIN 0.4 MG SL SUBL: 0.4000 mg | SUBLINGUAL_TABLET | SUBLINGUAL | 5 refills | Status: DC | PRN

## 2019-08-19 NOTE — Telephone Encounter (Signed)
Refill for nitroglycerin sent to patients pharmacy.

## 2019-10-12 ENCOUNTER — Telehealth: Payer: Self-pay | Admitting: Cardiovascular Disease

## 2019-10-12 NOTE — Telephone Encounter (Signed)
Patient had labs done through [facility] on 10/10/19. Including lipids and liver. He has the results on his [place] records. Adv we can't see those results yet but will watch for them for Dr. Angelena Form to review. If they do not appear in [place] will request from PCP, Dr. Joseph Berkshire or he will forward via [place].

## 2019-10-12 NOTE — Telephone Encounter (Signed)
Patient states he had labs done on Saturday for his PCP [facility]. He states he also had to fast for the labs he had done. He was wondering if could just cancel his appt tomorrow for lab work and have the labs from Saturday sent to [place].

## 2019-10-13 ENCOUNTER — Other Ambulatory Visit: Payer: Medicare Other

## 2019-10-14 NOTE — Telephone Encounter (Signed)
Called [facility] for lab results. Requested most recent lab work be faxed to Dr. Angelena Form at [phone].

## 2019-10-22 NOTE — Telephone Encounter (Signed)
Labs received.  Will request medical records scan into chart for review by Dr. Angelena Form.

## 2019-11-18 ENCOUNTER — Ambulatory Visit (INDEPENDENT_AMBULATORY_CARE_PROVIDER_SITE_OTHER): Payer: Medicare Other | Admitting: Physician Assistant

## 2019-11-18 ENCOUNTER — Other Ambulatory Visit: Payer: Self-pay

## 2019-11-18 ENCOUNTER — Encounter: Payer: Self-pay | Admitting: Physician Assistant

## 2019-11-18 VITALS — BP 98/60 | HR 51 | Ht 67.0 in | Wt 169.8 lb

## 2019-11-18 DIAGNOSIS — I10 Essential (primary) hypertension: Secondary | ICD-10-CM | POA: Diagnosis not present

## 2019-11-18 DIAGNOSIS — E785 Hyperlipidemia, unspecified: Secondary | ICD-10-CM | POA: Diagnosis not present

## 2019-11-18 DIAGNOSIS — I2510 Atherosclerotic heart disease of native coronary artery without angina pectoris: Secondary | ICD-10-CM | POA: Diagnosis not present

## 2019-11-18 MED ORDER — EZETIMIBE 10 MG PO TABS: 10.0000 mg | ORAL_TABLET | Freq: Every day | ORAL | 3 refills | Status: DC

## 2019-11-18 MED ORDER — IRBESARTAN 75 MG PO TABS: 75.0000 mg | ORAL_TABLET | Freq: Every day | ORAL | 3 refills | Status: DC

## 2019-11-18 NOTE — Patient Instructions (Signed)
Medication Instructions:  Your physician has recommended you make the following change in your medication:  1-Decrease Irbesartan 75 mg by mouth daily.  *If you need a refill on your cardiac medications before your next appointment, please call your pharmacy*  Lab Work: If you have labs (blood work) drawn today and your tests are completely normal, you will receive your results only by: [name] MyChart Message (if you have MyChart) OR . A paper copy in the mail If you have any lab test that is abnormal or we need to change your treatment, we will call you to review the results.  Testing/Procedures: None ordered today.  Follow-Up: At [facility], you and your health needs are our priority.  As part of our continuing mission to provide you with exceptional heart care, we have created designated Provider Care Teams.  These Care Teams include your primary Cardiologist (physician) and Advanced Practice Providers (APPs -  Physician Assistants and Nurse Practitioners) who all work together to provide you with the care you need, when you need it.  Your next appointment:   6 month(s)  The format for your next appointment:   In Person  Provider:   You may see Verne Carrow, MD or one of the following Advanced Practice Providers on your designated Care Team:    Ronie Spies, PA-C  Jacolyn Reedy, PA-C

## 2019-11-18 NOTE — Progress Notes (Signed)
Cardiology Office Note    Date:  11/18/2019   ID:  Sean Kesinger., DOB [birth year], MRN [id number]  PCP:  Alfonso Patten, MD  Cardiologist:  Dr. Clifton James  Chief Complaint: 3 Months follow up  History of Present Illness:   Sean Lemoine. is a 69 y.o. male with a past medical history significant for CAD, hypertension, hyperlipidemia and GERD presents for follow up.   He was evaluated in November 2017 by Dr. Donnie Aho for coronary calcification and had a stress test that showed ischemia. Cardiac cath on 09/21/16 showed a calcified 99% mid RCA stenosistreated withDiamondback orbital atherectomy of the RCA followed by placement of a Synergy DES x 1 in the mid RCA. He did well following the procedure. Echo 05/20/17 with normal LV systolic function and no valve disease.  Stress test 07/2019 for CP was low risk study without reversible ischemia. His pain felt MSK.   Here today for follow-up.  He goes to [facility] for exercise 3-4 times per week.  Unable to do so in the last month due to holidays.  He denies chest pain, shortness of breath, orthopnea, PND, syncope, lower extremity edema or melena.  Systolic blood pressure usually runs in 100 and intermittently feels dizziness.  Blood pressure of 98/60 today.  Past Medical History:  Diagnosis Date  . Arthritis   . Asthma    seasonal asthma as a child  . BPH (benign prostatic hypertrophy)   . Coronary artery disease    stent 2017  . GERD (gastroesophageal reflux disease)   . H/O seasonal allergies   . Hyperlipemia   . Hypertension   . Prostatitis     Past Surgical History:  Procedure Laterality Date  . APPENDECTOMY    . CARDIAC CATHETERIZATION N/A 09/21/2016   Procedure: Left Heart Cath and Coronary Angiography;  Surgeon: [name], MD;  Location: [facility] INVASIVE CV LAB;  Service: Cardiovascular;  Laterality: N/A;  . CARDIAC CATHETERIZATION N/A 09/24/2016   Procedure: Coronary Stent Intervention;  Surgeon:  [name], MD;  Location: [facility] INVASIVE CV LAB;  Service: Cardiovascular;  Laterality: N/A;  . CARDIAC CATHETERIZATION N/A 09/24/2016   Procedure: Coronary/Graft Atherectomy;  Surgeon: [name], MD;  Location: [facility] INVASIVE CV LAB;  Service: Cardiovascular;  Laterality: N/A;  . CARDIAC CATHETERIZATION N/A 09/24/2016   Procedure: Temporary Pacemaker;  Surgeon: [name], MD;  Location: [facility] INVASIVE CV LAB;  Service: Cardiovascular;  Laterality: N/A;  . CARDIOVASCULAR STRESS TEST  09/14/2016  . COLONOSCOPY    . CORONARY STENT PLACEMENT    . JOINT REPLACEMENT     left hip dr. Charlann Boxer 08-05-18  . LAPAROSCOPIC APPENDECTOMY N/A 07/14/2016   Procedure: APPENDECTOMY LAPAROSCOPIC;  Surgeon: Ovidio Kin, MD;  Location: [facility] ORS;  Service: General;  Laterality: N/A;  . TOTAL HIP ARTHROPLASTY     right 2012  . TOTAL HIP ARTHROPLASTY Left 08/05/2018   Procedure: LEFT TOTAL HIP ARTHROPLASTY ANTERIOR APPROACH;  Surgeon: Durene Romans, MD;  Location: [facility] ORS;  Service: Orthopedics;  Laterality: Left;  70 mins  . WISDOM TOOTH EXTRACTION      Current Medications: Prior to Admission medications   Medication Sig Start Date End Date Taking? Authorizing Provider  aspirin EC 81 MG tablet Take 81 mg by mouth every evening.     [provider]  calcium carbonate (TUMS - DOSED IN MG ELEMENTAL CALCIUM) 500 MG chewable tablet Chew 1 tablet by mouth daily as needed for heartburn.  [provider]  cetirizine (ZYRTEC) 10 MG tablet Take 10 mg by mouth daily as needed for allergies.    [provider]  cholecalciferol (VITAMIN D3) 25 MCG (1000 UT) tablet Take 1,000 Units by mouth daily.    [provider]  Coenzyme Q10 (COQ-10) 100 MG CAPS Take 100 mg by mouth 2 (two) times daily.    [provider]  Cyanocobalamin (RA VITAMIN B-12 TR) 1000 MCG TBCR Take 1,000 mcg by mouth daily.     [provider]  diphenhydrAMINE (BENADRYL) 25 MG  tablet Take 25 mg by mouth daily as needed for allergies.    [provider]  docusate sodium (COLACE) 100 MG capsule Take 1 capsule (100 mg total) by mouth 2 (two) times daily. 08/05/18   Lanney Gins, PA-C  ezetimibe (ZETIA) 10 MG tablet Take 1 tablet (10 mg total) by mouth daily. 01/30/19   [name], MD  fenofibrate 160 MG tablet Take 160 mg by mouth daily. 06/30/16   [provider]  fluticasone (FLONASE) 50 MCG/ACT nasal spray Place 1 spray into both nostrils daily as needed for allergies or rhinitis.    [provider]  folic acid (FOLVITE) 400 MCG tablet Take 400 mcg by mouth daily.    [provider]  irbesartan (AVAPRO) 150 MG tablet TAKE ONE TABLET BY MOUTH DAILY 04/15/19   [name], MD  Ketotifen Fumarate (ALAWAY OP) Place 1 drop into both eyes daily as needed (allergies).    [provider]  Boris Lown Oil 500 MG CAPS Take 500 mg by mouth daily.    [provider]  Melatonin 3 MG TABS Take 3 mg by mouth at bedtime as needed (sleep).    [provider]  metoprolol tartrate (LOPRESSOR) 25 MG tablet TAKE ONE TABLET BY MOUTH TWO TIMES A DAY 07/09/19   [name], MD  montelukast (SINGULAIR) 10 MG tablet Take 10 mg by mouth daily as needed (allergies).  06/09/16   [provider]  niacin (SLO-NIACIN) 500 MG tablet Take 500 mg by mouth daily.    [provider]  nitroGLYCERIN (NITROSTAT) 0.4 MG SL tablet Place 1 tablet (0.4 mg total) under the tongue every 5 (five) minutes as needed. 08/19/19   [name], MD  Plant Sterol Stanol-Pantethine (CHOLESTOFF COMPLETE) 300-100 MG CAPS Take 2 capsules by mouth every evening.     [provider]  psyllium (REGULOID) 0.52 g capsule Take 0.52 g by mouth daily.    [provider]  rosuvastatin (CRESTOR) 5 MG tablet Take 5 mg by mouth every Monday, Wednesday, and Friday.     [provider]     Allergies:   Penicillins, Atorvastatin calcium, and Rosuvastatin   Social History   Socioeconomic History  . Marital status: Married    Spouse name: Not on file  . Number of children: 2  . Years of education: Not on file  . Highest education level: Not on file  Occupational History  . Occupation: Airline pilot  Tobacco Use  . Smoking status: Former Smoker    Packs/day: 0.50    Years: 12.00    Pack years: 6.00    Types: Cigarettes    Quit date: 11/17/1978    Years since quitting: 41.0  . Smokeless tobacco: Never Used  Substance and Sexual Activity  . Alcohol use: Yes    Alcohol/week: 1.0 standard drinks    Types: 1 Glasses of wine per week    Comment: rare  .  Drug use: No  . Sexual activity: Yes  Other Topics Concern  . Not on file  Social History Narrative  . Not on file   Social Determinants of Health   Financial Resource Strain:   . Difficulty of Paying Living Expenses: Not on file  Food Insecurity:   . Worried About Programme researcher, broadcasting/film/video in the Last Year: Not on file  . Ran Out of Food in the Last Year: Not on file  Transportation Needs:   . Lack of Transportation (Medical): Not on file  . Lack of Transportation (Non-Medical): Not on file  Physical Activity:   . Days of Exercise per Week: Not on file  . Minutes of Exercise per Session: Not on file  Stress:   . Feeling of Stress : Not on file  Social Connections:   . Frequency of Communication with Friends and Family: Not on file  . Frequency of Social Gatherings with Friends and Family: Not on file  . Attends Religious Services: Not on file  . Active Member of Clubs or Organizations: Not on file  . Attends Banker Meetings: Not on file  . Marital Status: Not on file     Family History:  The patient's family history includes CVA in his mother; Coronary artery disease (age of onset: 78) in his mother; Heart attack in his paternal uncle; Heart attack (age of onset: 29) in his father; Microcephaly in his  paternal uncle.   ROS:   Please see the history of present illness.    ROS All other systems reviewed and are negative.   PHYSICAL EXAM:   VS:  BP 98/60   Pulse (!) 51   Ht 5\' 7"  (1.702 m)   Wt 169 lb 12.8 oz (77 kg)   SpO2 98%   BMI 26.59 kg/m    GEN: Well nourished, well developed, in no acute distress  HEENT: normal  Neck: no JVD, carotid bruits, or masses Cardiac: RRR; no murmurs, rubs, or gallops,no edema  Respiratory:  clear to auscultation bilaterally, normal work of breathing GI: soft, nontender, nondistended, + BS MS: no deformity or atrophy  Skin: warm and dry, no rash Neuro:  Alert and Oriented x 3, Strength and sensation are intact Psych: euthymic mood, full affect  Wt Readings from Last 3 Encounters:  11/18/19 169 lb 12.8 oz (77 kg)  07/30/19 166 lb (75.3 kg)  07/24/19 166 lb (75.3 kg)      Studies/Labs Reviewed:   EKG:  EKG is not ordered today.    Recent Labs: No results found for requested labs within last 8760 hours.   Lipid Panel No results found for: CHOL, TRIG, HDL, CHOLHDL, VLDL, LDLCALC, LDLDIRECT  Additional studies/ records that were reviewed today include:   Stress test 07/2019  Nuclear stress EF: 58%.  No T wave inversion was noted during stress.  There was no ST segment deviation noted during stress.  This is a low risk study.   No reversible ischemia. LVEF 58% with normal wall motion. This is a low risk study.    ASSESSMENT & PLAN:    1.  CAD No angina.  Recent stress test low risk.  No reversible ischemia.  Continue exercise program.  He will avoid [employer] for few months due to surgery and Covid.  Continue community walk.  Continue aspirin, beta-blocker and statin.  2.  Hyperlipidemia -Intolerance to high-dose statin.  Currently tolerating Zetia 10 mg daily and Crestor 5 mg Monday Wednesday Friday. -Recent lab  work by PCP reviewed personally LDL 55, HDL 46, triglyceride LDL 78, total cholesterol 118. -Continue current  therapy.  3.  Hypertension -Blood pressure soft low.  His intermittent dizziness could be due to low blood pressure. -Reduce ibesartan.  Continue metoprolol at current dose.    Medication Adjustments/Labs and Tests Ordered: Current medicines are reviewed at length with the patient today.  Concerns regarding medicines are outlined above.  Medication changes, Labs and Tests ordered today are listed in the Patient Instructions below. Patient Instructions  Medication Instructions:  Your physician has recommended you make the following change in your medication:  1-Decrease Irbesartan 75 mg by mouth daily.  *If you need a refill on your cardiac medications before your next appointment, please call your pharmacy*  Lab Work: If you have labs (blood work) drawn today and your tests are completely normal, you will receive your results only by: [name] MyChart Message (if you have MyChart) OR . A paper copy in the mail If you have any lab test that is abnormal or we need to change your treatment, we will call you to review the results.  Testing/Procedures: None ordered today.  Follow-Up: At [facility], you and your health needs are our priority.  As part of our continuing mission to provide you with exceptional heart care, we have created designated Provider Care Teams.  These Care Teams include your primary Cardiologist (physician) and Advanced Practice Providers (APPs -  Physician Assistants and Nurse Practitioners) who all work together to provide you with the care you need, when you need it.  Your next appointment:   6 month(s)  The format for your next appointment:   In Person  Provider:   You may see Lauree Chandler, MD or one of the following Advanced Practice Providers on your designated Care Team:    Melina Copa, PA-C  Ermalinda Barrios, PA-C      SignedCrista Luria Ginger Blue, [place]  11/18/2019 11:24 AM    [address], [place], [place]   [address] Phone: [phone]; Fax: [phone]

## 2020-02-02 ENCOUNTER — Telehealth: Payer: Self-pay | Admitting: *Deleted

## 2020-02-02 NOTE — Telephone Encounter (Signed)
   Primary Cardiologist: Verne Carrow, MD  Chart reviewed as part of pre-operative protocol coverage. Cataract extractions are recognized in guidelines as low risk surgeries that do not typically require specific preoperative testing or holding of blood thinner therapy. Therefore, given past medical history and time since last visit, based on ACC/AHA guidelines, Sean Schultz. would be at acceptable risk for the planned procedure without further cardiovascular testing.   I will route this recommendation to the requesting party via Epic fax function and remove from pre-op pool.  Please call with questions.  Corrin Parker, PA-C 02/02/2020, 1:18 PM

## 2020-02-02 NOTE — Telephone Encounter (Signed)
   [facility] Pre-operative Risk Assessment    Request for surgical clearance:  1. What type of surgery is being performed? LEFT EYE CATARACT EXTRACTION w/INTRAOCULAR LENS IMPLANT ON 02/26/20; FOLLOWED BY THE RIGHT EYE TO BE DONE ON 03/11/20 (SAME PROCEDURE)    2. When is this surgery scheduled? 02/26/20   3. What type of clearance is required (medical clearance vs. Pharmacy clearance to hold med vs. Both)? MEDICAL  4. Are there any medications that need to be held prior to surgery and how long? NO MEDICATIONS NEED TO BE HELD INCLUDING ANY BLOOD THINNERS   5. Practice name and name of physician performing surgery? [facility]; DR. Christia Reading BEVIS   6. What is your office phone number [phone]    7.   What is your office fax number [phone]  8.   Anesthesia type (None, local, MAC, general) ? TOPICAL ANESTHESIA WITH IV MEDICATION   Sean Schultz 02/02/2020, 11:11 AM  _________________________________________________________________   (provider comments below)

## 2020-03-29 DIAGNOSIS — D638 Anemia in other chronic diseases classified elsewhere: Secondary | ICD-10-CM | POA: Insufficient documentation

## 2020-03-31 ENCOUNTER — Other Ambulatory Visit: Payer: Self-pay | Admitting: Cardiovascular Disease

## 2020-03-31 MED ORDER — METOPROLOL TARTRATE 25 MG PO TABS: 25.0000 mg | ORAL_TABLET | Freq: Two times a day (BID) | ORAL | 1 refills | Status: DC

## 2020-03-31 MED ORDER — IRBESARTAN 75 MG PO TABS: 75.0000 mg | ORAL_TABLET | Freq: Every day | ORAL | 1 refills | Status: DC

## 2020-04-14 ENCOUNTER — Other Ambulatory Visit: Payer: Self-pay | Admitting: *Deleted

## 2020-04-14 MED ORDER — IRBESARTAN 150 MG PO TABS: 150.0000 mg | ORAL_TABLET | Freq: Every day | ORAL | 3 refills | Status: DC

## 2020-04-22 ENCOUNTER — Telehealth: Payer: Self-pay | Admitting: Cardiovascular Disease

## 2020-04-22 NOTE — Telephone Encounter (Signed)
New message  Pt c/o of Chest Pain: STAT if CP now or developed within 24 hours  1. Are you having CP right now? No   2. Are you experiencing any other symptoms (ex. SOB, nausea, vomiting, sweating)?indigestion   3. How long have you been experiencing CP? Since he got up this morning  4. Is your CP continuous or coming and going? Moving around   5. Have you taken Nitroglycerin? No  ?

## 2020-04-22 NOTE — Telephone Encounter (Signed)
Spoke with pt who states he developed indigestion after eating at [facility].  Pt reports discomfort above his diaphragm that was relieved by TUMS.  Pt reports he has had reflux in the past and was on medication.  Pt states he did not try taking any nitro for the pain.  Pt denies current CP, SOB, dizziness, nausea or vomiting.  [place] and [place] of town until May 15, 2020.  Pt advised to contact his PCP re: possible return of reflux, indigestion symptoms.  Pt has 06/06/2020 appointment with Dr Clifton James.  Offered to move appointment up for pt.  Pt advised if he develops active CP,SOB or dizziness to go to ED for further evaluation.  Advised if he does go out of town to make sure he takes bottle of nitro with him and to check date and to find the closest hospital to his destination.  Pt states he will see how he feels and if needed he will call Monday am to have appointment moved up.  Pt verbalizes understanding and agrees with current plan.

## 2020-06-06 ENCOUNTER — Ambulatory Visit (INDEPENDENT_AMBULATORY_CARE_PROVIDER_SITE_OTHER): Payer: Medicare Other | Admitting: Cardiovascular Disease

## 2020-06-06 ENCOUNTER — Encounter: Payer: Self-pay | Admitting: Cardiovascular Disease

## 2020-06-06 ENCOUNTER — Other Ambulatory Visit: Payer: Self-pay

## 2020-06-06 VITALS — BP 122/62 | HR 64 | Ht 67.0 in | Wt 163.0 lb

## 2020-06-06 DIAGNOSIS — I2510 Atherosclerotic heart disease of native coronary artery without angina pectoris: Secondary | ICD-10-CM | POA: Diagnosis not present

## 2020-06-06 DIAGNOSIS — E7800 Pure hypercholesterolemia, unspecified: Secondary | ICD-10-CM | POA: Diagnosis not present

## 2020-06-06 NOTE — Progress Notes (Signed)
Chief Complaint  Patient presents with  . Follow-up    CAD   History of Present Illness: 69 yo male with history of CAD, HTN, hyperlipidemia and GERD who is here today for cardiac follow up. He was evaluated in November 2017 by Dr. Donnie Aho for coronary calcification and had a stress test that showed ischemia. I performed a cardiac cath on 09/21/16 which showed a calcified 99% mid RCA stenosis treated with [facility] orbital atherectomy of the RCA followed by placement of a Synergy DES x 1 in the mid RCA. He did well following the procedure. Echo 05/20/17 with normal LV systolic function and no valve disease. Nuclear stress test September 2020 without ischemia.   He is here today for follow up. The patient denies any chest pain, dyspnea, palpitations, lower extremity edema, orthopnea, PND, dizziness, near syncope or syncope.    Primary Care Physician: Alfonso Patten, MD  Past Medical History:  Diagnosis Date  . Arthritis   . Asthma    seasonal asthma as a child  . BPH (benign prostatic hypertrophy)   . Coronary artery disease    stent 2017  . GERD (gastroesophageal reflux disease)   . H/O seasonal allergies   . Hyperlipemia   . Hypertension   . Prostatitis     Past Surgical History:  Procedure Laterality Date  . APPENDECTOMY    . CARDIAC CATHETERIZATION N/A 09/21/2016   Procedure: Left Heart Cath and Coronary Angiography;  Surgeon: [name], MD;  Location: [facility] INVASIVE CV LAB;  Service: Cardiovascular;  Laterality: N/A;  . CARDIAC CATHETERIZATION N/A 09/24/2016   Procedure: Coronary Stent Intervention;  Surgeon: [name], MD;  Location: [facility] INVASIVE CV LAB;  Service: Cardiovascular;  Laterality: N/A;  . CARDIAC CATHETERIZATION N/A 09/24/2016   Procedure: Coronary/Graft Atherectomy;  Surgeon: [name], MD;  Location: [facility] INVASIVE CV LAB;  Service: Cardiovascular;  Laterality: N/A;  . CARDIAC CATHETERIZATION N/A 09/24/2016    Procedure: Temporary Pacemaker;  Surgeon: [name], MD;  Location: [facility] INVASIVE CV LAB;  Service: Cardiovascular;  Laterality: N/A;  . CARDIOVASCULAR STRESS TEST  09/14/2016  . COLONOSCOPY    . CORONARY STENT PLACEMENT    . JOINT REPLACEMENT     left hip dr. Charlann Boxer 08-05-18  . LAPAROSCOPIC APPENDECTOMY N/A 07/14/2016   Procedure: APPENDECTOMY LAPAROSCOPIC;  Surgeon: Ovidio Kin, MD;  Location: [facility] ORS;  Service: General;  Laterality: N/A;  . TOTAL HIP ARTHROPLASTY     right 2012  . TOTAL HIP ARTHROPLASTY Left 08/05/2018   Procedure: LEFT TOTAL HIP ARTHROPLASTY ANTERIOR APPROACH;  Surgeon: Durene Romans, MD;  Location: [facility] ORS;  Service: Orthopedics;  Laterality: Left;  70 mins  . WISDOM TOOTH EXTRACTION      Current Outpatient Medications  Medication Sig Dispense Refill  . aspirin EC 81 MG tablet Take 81 mg by mouth every evening.     . calcium carbonate (TUMS - DOSED IN MG ELEMENTAL CALCIUM) 500 MG chewable tablet Chew 1 tablet by mouth daily as needed for heartburn.     . cetirizine (ZYRTEC) 10 MG tablet Take 10 mg by mouth daily as needed for allergies.    . cholecalciferol (VITAMIN D3) 25 MCG (1000 UT) tablet Take 1,000 Units by mouth daily.    . Coenzyme Q10 (COQ-10) 100 MG CAPS Take 100 mg by mouth 2 (two) times daily.    . Cyanocobalamin (RA VITAMIN B-12 TR) 1000 MCG TBCR Take 1,000 mcg by mouth daily.     [name]  diphenhydrAMINE (BENADRYL) 25 MG tablet Take 25 mg by mouth daily as needed for allergies.    [name] docusate sodium (COLACE) 100 MG capsule Take 1 capsule (100 mg total) by mouth 2 (two) times daily. 10 capsule 0  . ezetimibe (ZETIA) 10 MG tablet Take 1 tablet (10 mg total) by mouth daily. 90 tablet 3  . fluticasone (FLONASE) 50 MCG/ACT nasal spray Place 1 spray into both nostrils daily as needed for allergies or rhinitis.    . folic acid (FOLVITE) 400 MCG tablet Take 400 mcg by mouth daily.    . irbesartan (AVAPRO) 150 MG tablet Take 1 tablet (150 mg total) by mouth daily.  90 tablet 3  . Ketotifen Fumarate (ALAWAY OP) Place 1 drop into both eyes daily as needed (allergies).    Boris Lown Oil 500 MG CAPS Take 500 mg by mouth daily.    . Melatonin 3 MG TABS Take 3 mg by mouth at bedtime as needed (sleep).    . metoprolol tartrate (LOPRESSOR) 25 MG tablet Take 1 tablet (25 mg total) by mouth 2 (two) times daily. 180 tablet 1  . montelukast (SINGULAIR) 10 MG tablet Take 10 mg by mouth daily as needed (allergies).   1  . niacin (SLO-NIACIN) 500 MG tablet Take 500 mg by mouth daily.    . nitroGLYCERIN (NITROSTAT) 0.4 MG SL tablet Place 1 tablet (0.4 mg total) under the tongue every 5 (five) minutes as needed. 25 tablet 5  . Plant Sterol Stanol-Pantethine (CHOLESTOFF COMPLETE) 300-100 MG CAPS Take 2 capsules by mouth every evening.     . psyllium (REGULOID) 0.52 g capsule Take 0.52 g by mouth daily.    . rosuvastatin (CRESTOR) 5 MG tablet Take 5 mg by mouth every Monday, Wednesday, and Friday.      No current facility-administered medications for this visit.    Allergies  Allergen Reactions  . Penicillins Swelling and Other (See Comments)    Dyspnea, hair loss-childhood allergy Has patient had a PCN reaction causing immediate rash, facial/tongue/throat swelling, SOB or lightheadedness with hypotension:unsure Has patient had a PCN reaction causing severe rash involving mucus membranes or skin necrosis:unsure Has patient had a PCN reaction that required hospitalization:unsure Has patient had a PCN reaction occurring within the last 10 years:No If all of the above answers are "NO", then may proceed with Cephalosporin use.    . Atorvastatin Calcium Other (See Comments)    LFT increased LFT increased  . Rosuvastatin     In high doses caused levated LFT Joint muscle pain      Social History   Socioeconomic History  . Marital status: Married    Spouse name: Not on file  . Number of children: 2  . Years of education: Not on file  . Highest education level: Not  on file  Occupational History  . Occupation: Airline pilot  Tobacco Use  . Smoking status: Former Smoker    Packs/day: 0.50    Years: 12.00    Pack years: 6.00    Types: Cigarettes    Quit date: 11/17/1978    Years since quitting: 41.5  . Smokeless tobacco: Never Used  Vaping Use  . Vaping Use: Never used  Substance and Sexual Activity  . Alcohol use: Yes    Alcohol/week: 1.0 standard drink    Types: 1 Glasses of wine per week    Comment: rare  . Drug use: No  . Sexual activity: Yes  Other Topics Concern  . Not on file  Social History Narrative  . Not on file   Social Determinants of Health   Financial Resource Strain:   . Difficulty of Paying Living Expenses:   Food Insecurity:   . Worried About Programme researcher, broadcasting/film/videounning Out of Food in the Last Year:   . Baristaan Out of Food in the Last Year:   Transportation Needs:   . Freight forwarderLack of Transportation (Medical):   [name]. Lack of Transportation (Non-Medical):   Physical Activity:   . Days of Exercise per Week:   . Minutes of Exercise per Session:   Stress:   . Feeling of Stress :   Social Connections:   . Frequency of Communication with Friends and Family:   . Frequency of Social Gatherings with Friends and Family:   . Attends Religious Services:   . Active Member of Clubs or Organizations:   . Attends BankerClub or Organization Meetings:   [name]. Marital Status:   Intimate Partner Violence:   . Fear of Current or Ex-Partner:   . Emotionally Abused:   [name]. Physically Abused:   . Sexually Abused:     Family History  Problem Relation Age of Onset  . Coronary artery disease Mother 1763  . CVA Mother   . Heart attack Father 5947  . Microcephaly Paternal Uncle   . Heart attack Paternal Uncle     Review of Systems:  As stated in the HPI and otherwise negative.   BP (!) 122/62   Pulse 64   Ht 5\' 7"  (1.702 m)   Wt 163 lb (73.9 kg)   SpO2 97%   BMI 25.53 kg/m   Physical Examination:  General: Well developed, well nourished, NAD  HEENT: OP clear, mucus membranes  moist  SKIN: warm, dry. No rashes. Neuro: No focal deficits  Musculoskeletal: Muscle strength 5/5 all ext  Psychiatric: Mood and affect normal  Neck: No JVD, no carotid bruits, no thyromegaly, no lymphadenopathy.  Lungs:Clear bilaterally, no wheezes, rhonci, crackles Cardiovascular: Regular rate and rhythm. No murmurs, gallops or rubs. Abdomen:Soft. Bowel sounds present. Non-tender.  Extremities: No lower extremity edema. Pulses are 2 + in the bilateral DP/PT.  Echo 05/20/17: - Left ventricle: The cavity size was normal. Systolic function was   normal. The estimated ejection fraction was in the range of 55%   to 60%. Wall motion was normal; there were no regional wall   motion abnormalities. Left ventricular diastolic function   parameters were normal.  EKG:  EKG is  not ordered today. The ekg ordered today demonstrates   Recent Labs: No results found for requested labs within last 8760 hours.   Lipid Pane Lipid Panel  No results found for: CHOL, TRIG, HDL, CHOLHDL, VLDL, LDLCALC, LDLDIRECT, LABVLDL    Wt Readings from Last 3 Encounters:  06/06/20 163 lb (73.9 kg)  11/18/19 169 lb 12.8 oz (77 kg)  07/30/19 166 lb (75.3 kg)     Other studies Reviewed: Additional studies/ records that were reviewed today include: . Review of the above records demonstrates:   Assessment and Plan:   1. CAD without angina: He has no chest pain. Continue ASA, statin, Zetia and beta blocker.    2. Hyperlipidemia: LDL 54 in may 2021. Continue statin and Zetia.   Current medicines are reviewed at length with the patient today.  The patient does not have concerns regarding medicines.  The following changes have been made:  no change  Labs/ tests ordered today include:   No orders of the defined types were placed in this  encounter.  Disposition:   FU with me in 12  months  Signed, Verne Carrow, MD 06/06/2020 8:41 AM    [facility] [address] [place],  [place], [place]  [address] Phone: [phone]; Fax: [phone]

## 2020-06-06 NOTE — Patient Instructions (Signed)

## 2020-08-25 ENCOUNTER — Telehealth: Payer: Self-pay | Admitting: Cardiovascular Disease

## 2020-08-25 NOTE — Telephone Encounter (Signed)
Pt c/o medication issue:  1. Name of Medication: irbesartan (AVAPRO) 150 MG tablet  2. How are you currently taking this medication (dosage and times per day)? As directed  3. Are you having a reaction (difficulty breathing--STAT)? no  4. What is your medication issue? Patient states that he got a message from his pharmacy that this medication potentially may have been recalled and to contact his provider.

## 2020-08-25 NOTE — Telephone Encounter (Signed)
Will route to PharmD to see if aware of any current recalls.

## 2020-08-25 NOTE — Telephone Encounter (Signed)
The patient will listen to his VM from Karin Golden again and then take his bottle to them tomorrow and find out for sure if they are saying his medication is recalled.  If not he will see if there is going to be a supply issue for future refills.  He will call me back with an update.  He has at least 4 weeks of the medication in his current bottle.

## 2020-08-25 NOTE — Telephone Encounter (Signed)
I am not aware of any recalls for this. The FDA currently lists a combo irbesartan-HCTZ pill as being voluntarily recalled, but not the singular irbesartan pill.   Not sure why the patient's pharmacy told him to call us, the pharmacy would have the list of any recalled NDCs and would know if the pt received any affected medication. If the pharmacy is still able to order and fill his irbesartan, he should continue on it. If there is a supply issue with his specific pharmacy, can transfer rx to different store or pharmacy chain if needed.

## 2020-09-18 ENCOUNTER — Other Ambulatory Visit: Payer: Self-pay | Admitting: Cardiovascular Disease

## 2020-12-07 ENCOUNTER — Other Ambulatory Visit: Payer: Self-pay | Admitting: Cardiovascular Disease

## 2020-12-17 ENCOUNTER — Other Ambulatory Visit: Payer: Self-pay | Admitting: Physician Assistant

## 2021-01-26 ENCOUNTER — Telehealth: Payer: Self-pay | Admitting: Cardiovascular Disease

## 2021-01-26 NOTE — Telephone Encounter (Signed)
Patient is having 5 crowns replaced and wants to know if Dr. Clifton James recommends he have any antibiotics prior to this being done.

## 2021-01-26 NOTE — Telephone Encounter (Signed)
I reached out to the pt and informed him that we will need to obtain a formal clearance request from the dental office. Per pt the dentist is Dr. Jonnie Finner, DDS, [address] [place], [place] [address], ph# [phone], fax # [phone].   S/w the dental office and have asked for a clearance request to be faxed to our office fax # [phone]. Confirmed that pt's dental work will be in stages as he will be having some crowns removed and fillings to be fixed then crowns replaced. Will await for form clearance form to be received. Start date of procedures for the pt will be 02/08/21.

## 2021-01-30 NOTE — Telephone Encounter (Signed)
I reached out to DDS office today to see if they had faxed over a clearance request , as I have not received a fax at this time. Was informed they will s/w the DDS and will call back with the information for pre op request.

## 2021-01-30 NOTE — Telephone Encounter (Signed)
Route to pre op call back

## 2021-01-31 NOTE — Telephone Encounter (Signed)
Darlen Round 70 year old male is requesting cardiac clearance for multiple dental restoration; core buildup and crown on tooth 2 and 3 along with composite filling on tooth 4.  He was last seen in the office on 06/06/2020.  During that time he was doing well.  He denied chest pain, dyspnea, palpitations, lower extremity swelling, orthopnea, PND, dizziness, and presyncope/syncope.  His PMH includes coronary artery disease, hypertension, hyperlipidemia, and GERD.  He underwent coronary calcification and stress testing 11/17.  This test showed ischemia.  He underwent cardiac catheterization 09/21/2016 which showed a 99% occluded mid RCA which received DES x1.  Echocardiogram 05/20/2017 showed normal LV function.  Nuclear stress test 9/20 showed no ischemia.  May his aspirin be held prior to procedure?   Thank you for your help.  Please direct response to CV DIV preop for.  Thomasene Ripple. Cleaver NP-C    01/31/2021, 10:24 AM [facility] [address] Office [phone] Fax [phone]

## 2021-01-31 NOTE — Telephone Encounter (Signed)
OK to hold ASA prior to his procedure for one week. Sean Schultz

## 2021-01-31 NOTE — Telephone Encounter (Signed)
° °  Primary Cardiologist: Verne Carrow, MD  Chart reviewed as part of pre-operative protocol coverage. Given past medical history and time since last visit, based on ACC/AHA guidelines, Sean Schultz. would be at acceptable risk for the planned procedure without further cardiovascular testing.   His aspirin may be held for 5-7 days prior to his procedure.  Please resume as soon as hemostasis is achieved at the discretion of the surgeon.  I will route this recommendation to the requesting party via Epic fax function and remove from pre-op pool.  Please call with questions.  Thomasene Ripple. Beatric Fulop NP-C    01/31/2021, 11:08 AM [facility] [address] Office [phone] Fax [phone]

## 2021-01-31 NOTE — Telephone Encounter (Signed)
   [facility] Pre-operative Risk Assessment    HEARTCARE STAFF: - Please ensure there is not already an duplicate clearance open for this procedure. - Under Visit Info/Reason for Call, type in Other and utilize the format Clearance MM/DD/YY or Clearance TBD. Do not use dashes or single digits. - If request is for dental extraction, please clarify the # of teeth to be extracted.  Request for surgical clearance: PER DR. PHILLIPS RESTORATIONS WILL REQUIRE: PLACEMENT OF RETRACTION CORD IN THE DENTAL SULCUS; REDUCTION OF TOOTH STRUCTURE WITH POSSIBLE PULPAL EXPOSURE; BASED ON CLINICAL FINDINGS AT THE TIME OF THIS PROCEDURE, HE MAY REQUIRE A SEPARATE PROCEDURE SUCH AS A ROOT CANAL OR EXTRACTION; HE WILL WEAR PROVISIONAL RESTORATIONS FOR APPROX 2 WEEKS PRIOR TO DELIVERY OF THE CROWNS. THE DELIVERY OF THE CROWNS SHOULD NOT REQUIRE LOCAL ANESTHETIC, RETRACTION CORD OR REDUCTION OF TOOTH STRUCTURE. HOWEVER, THIS APPT MAYN INDUCE MILD GINGIVAL BLEEDING  1. What type of surgery is being performed? MULTIPLE DENTAL RESTORATIONS; CORE BUILDUP AND CROWN ON TOOTH # 2 AND # 3, COMPOSITE FILLING ON # 4  2. When is this surgery scheduled? 02/08/21   3. What type of clearance is required (medical clearance vs. Pharmacy clearance to hold med vs. Both)? MEDICAL  4. Are there any medications that need to be held prior to surgery and how long? ASA    5. Practice name and name of physician performing surgery? DR. Almyra Free PHILLIPS   6. What is the office phone number? [phone]   7.   What is the office fax number? [phone]  8.   Anesthesia type (None, local, MAC, general) ? LOCAL ([place])   Julaine Hua 01/31/2021, 9:43 AM  _________________________________________________________________   (provider comments below)

## 2021-06-14 ENCOUNTER — Ambulatory Visit (INDEPENDENT_AMBULATORY_CARE_PROVIDER_SITE_OTHER): Payer: Medicare Other | Admitting: Cardiovascular Disease

## 2021-06-14 ENCOUNTER — Encounter: Payer: Self-pay | Admitting: Cardiovascular Disease

## 2021-06-14 ENCOUNTER — Other Ambulatory Visit: Payer: Self-pay

## 2021-06-14 VITALS — BP 122/72 | HR 61 | Ht 67.0 in | Wt 159.8 lb

## 2021-06-14 DIAGNOSIS — I2510 Atherosclerotic heart disease of native coronary artery without angina pectoris: Secondary | ICD-10-CM | POA: Diagnosis not present

## 2021-06-14 DIAGNOSIS — E7800 Pure hypercholesterolemia, unspecified: Secondary | ICD-10-CM

## 2021-06-14 MED ORDER — ROSUVASTATIN CALCIUM 5 MG PO TABS: ORAL_TABLET | ORAL | 3 refills | Status: DC

## 2021-06-14 NOTE — Progress Notes (Signed)
Chief Complaint  Patient presents with   Follow-up    CAD    History of Present Illness: 70 yo male with history of CAD, HTN, hyperlipidemia and GERD who is here today for cardiac follow up. He was evaluated in November 2017 by Dr. Donnie Aho for coronary calcification and had a stress test that showed ischemia. I performed a cardiac cath on 09/21/16 which showed a calcified 99% mid RCA stenosis treated with [facility] orbital atherectomy of the RCA followed by placement of a Synergy DES x 1 in the mid RCA. He did well following the procedure. Echo 05/20/17 with normal LV systolic function and no valve disease. Nuclear stress test September 2020 without ischemia.   He is here today for follow up. The patient denies any chest pain, dyspnea, palpitations, lower extremity edema, orthopnea, PND, dizziness, near syncope or syncope.    Primary Care Physician: Alfonso Patten, MD  Past Medical History:  Diagnosis Date   Arthritis    Asthma    seasonal asthma as a child   BPH (benign prostatic hypertrophy)    Coronary artery disease    stent 2017   GERD (gastroesophageal reflux disease)    H/O seasonal allergies    Hyperlipemia    Hypertension    Prostatitis     Past Surgical History:  Procedure Laterality Date   APPENDECTOMY     CARDIAC CATHETERIZATION N/A 09/21/2016   Procedure: Left Heart Cath and Coronary Angiography;  Surgeon: [name], MD;  Location: [facility] INVASIVE CV LAB;  Service: Cardiovascular;  Laterality: N/A;   CARDIAC CATHETERIZATION N/A 09/24/2016   Procedure: Coronary Stent Intervention;  Surgeon: [name], MD;  Location: [facility] INVASIVE CV LAB;  Service: Cardiovascular;  Laterality: N/A;   CARDIAC CATHETERIZATION N/A 09/24/2016   Procedure: Coronary/Graft Atherectomy;  Surgeon: [name], MD;  Location: [facility] INVASIVE CV LAB;  Service: Cardiovascular;  Laterality: N/A;   CARDIAC CATHETERIZATION N/A 09/24/2016   Procedure:  Temporary Pacemaker;  Surgeon: [name], MD;  Location: [facility] INVASIVE CV LAB;  Service: Cardiovascular;  Laterality: N/A;   CARDIOVASCULAR STRESS TEST  09/14/2016   COLONOSCOPY     CORONARY STENT PLACEMENT     JOINT REPLACEMENT     left hip dr. Charlann Boxer 08-05-18   LAPAROSCOPIC APPENDECTOMY N/A 07/14/2016   Procedure: APPENDECTOMY LAPAROSCOPIC;  Surgeon: Ovidio Kin, MD;  Location: [facility] ORS;  Service: General;  Laterality: N/A;   TOTAL HIP ARTHROPLASTY     right 2012   TOTAL HIP ARTHROPLASTY Left 08/05/2018   Procedure: LEFT TOTAL HIP ARTHROPLASTY ANTERIOR APPROACH;  Surgeon: Durene Romans, MD;  Location: [facility] ORS;  Service: Orthopedics;  Laterality: Left;  70 mins   WISDOM TOOTH EXTRACTION      Current Outpatient Medications  Medication Sig Dispense Refill   aspirin EC 81 MG tablet Take 81 mg by mouth every evening.      calcium carbonate (TUMS - DOSED IN MG ELEMENTAL CALCIUM) 500 MG chewable tablet Chew 1 tablet by mouth daily as needed for heartburn.      cetirizine (ZYRTEC) 10 MG tablet Take 10 mg by mouth daily as needed for allergies.     cholecalciferol (VITAMIN D3) 25 MCG (1000 UT) tablet Take 1,000 Units by mouth daily.     Coenzyme Q10 (COQ-10) 100 MG CAPS Take 100 mg by mouth 2 (two) times daily.     Cyanocobalamin 1000 MCG TBCR Take 1,000 mcg by mouth daily.      diphenhydrAMINE (BENADRYL)  25 MG tablet Take 25 mg by mouth daily as needed for allergies.     docusate sodium (COLACE) 100 MG capsule Take 1 capsule (100 mg total) by mouth 2 (two) times daily. 10 capsule 0   fluticasone (FLONASE) 50 MCG/ACT nasal spray Place 1 spray into both nostrils daily as needed for allergies or rhinitis.     folic acid (FOLVITE) 400 MCG tablet Take 400 mcg by mouth daily.     irbesartan (AVAPRO) 150 MG tablet Take 1 tablet (150 mg total) by mouth daily. 90 tablet 3   Ketotifen Fumarate (ALAWAY OP) Place 1 drop into both eyes daily as needed (allergies).     Krill Oil 500 MG CAPS Take 500 mg  by mouth daily.     Melatonin 3 MG TABS Take 3 mg by mouth at bedtime as needed (sleep).     metoprolol tartrate (LOPRESSOR) 25 MG tablet TAKE ONE TABLET BY MOUTH TWICE A DAY 180 tablet 2   montelukast (SINGULAIR) 10 MG tablet Take 10 mg by mouth daily as needed (allergies).   1   niacin (SLO-NIACIN) 500 MG tablet Take 500 mg by mouth daily.     nitroGLYCERIN (NITROSTAT) 0.4 MG SL tablet DISSOLVE 1 TAB UNDER TONGUE FOR CHEST PAIN - IF PAIN REMAINS AFTER 5 MIN, CALL 911 AND REPEAT DOSE. MAX 3 TABS IN 15 MINUTES 25 tablet 5   Plant Sterol Stanol-Pantethine 300-100 MG CAPS Take 2 capsules by mouth every evening.      psyllium (REGULOID) 0.52 g capsule Take 0.52 g by mouth daily.     rosuvastatin (CRESTOR) 5 MG tablet Take one tablet by mouth 5 times a week 60 tablet 3   No current facility-administered medications for this visit.    Allergies  Allergen Reactions   Penicillins Swelling and Other (See Comments)    Dyspnea, hair loss-childhood allergy Has patient had a PCN reaction causing immediate rash, facial/tongue/throat swelling, SOB or lightheadedness with hypotension:unsure Has patient had a PCN reaction causing severe rash involving mucus membranes or skin necrosis:unsure Has patient had a PCN reaction that required hospitalization:unsure Has patient had a PCN reaction occurring within the last 10 years:No If all of the above answers are "NO", then may proceed with Cephalosporin use.     Atorvastatin Calcium Other (See Comments)    LFT increased LFT increased   Rosuvastatin     In high doses caused levated LFT Joint muscle pain      Social History   Socioeconomic History   Marital status: Married    Spouse name: Not on file   Number of children: 2   Years of education: Not on file   Highest education level: Not on file  Occupational History   Occupation: sales  Tobacco Use   Smoking status: Former    Packs/day: 0.50    Years: 12.00    Pack years: 6.00    Types:  Cigarettes    Quit date: 11/17/1978    Years since quitting: 42.6   Smokeless tobacco: Never  Vaping Use   Vaping Use: Never used  Substance and Sexual Activity   Alcohol use: Yes    Alcohol/week: 1.0 standard drink    Types: 1 Glasses of wine per week    Comment: rare   Drug use: No   Sexual activity: Yes  Other Topics Concern   Not on file  Social History Narrative   Not on file   Social Determinants of Health   Financial Resource Strain:  Not on file  Food Insecurity: Not on file  Transportation Needs: Not on file  Physical Activity: Not on file  Stress: Not on file  Social Connections: Not on file  Intimate Partner Violence: Not on file    Family History  Problem Relation Age of Onset   Coronary artery disease Mother 35   CVA Mother    Heart attack Father 79   Microcephaly Paternal Uncle    Heart attack Paternal Uncle     Review of Systems:  As stated in the HPI and otherwise negative.   BP 122/72   Pulse 61   Ht 5\' 7"  (1.702 m)   Wt 159 lb 12.8 oz (72.5 kg)   SpO2 98%   BMI 25.03 kg/m   Physical Examination:  General: Well developed, well nourished, NAD  HEENT: OP clear, mucus membranes moist  SKIN: warm, dry. No rashes. Neuro: No focal deficits  Musculoskeletal: Muscle strength 5/5 all ext  Psychiatric: Mood and affect normal  Neck: No JVD, no carotid bruits, no thyromegaly, no lymphadenopathy.  Lungs:Clear bilaterally, no wheezes, rhonci, crackles Cardiovascular: Regular rate and rhythm. No murmurs, gallops or rubs. Abdomen:Soft. Bowel sounds present. Non-tender.  Extremities: No lower extremity edema. Pulses are 2 + in the bilateral DP/PT.  Echo 05/20/17: - Left ventricle: The cavity size was normal. Systolic function was   normal. The estimated ejection fraction was in the range of 55%   to 60%. Wall motion was normal; there were no regional wall   motion abnormalities. Left ventricular diastolic function   parameters were normal.  EKG:  EKG is  ordered today. The ekg ordered today demonstrates sinus  Recent Labs: No results found for requested labs within last 8760 hours.   Lipid Pane Lipid Panel  No results found for: CHOL, TRIG, HDL, CHOLHDL, VLDL, LDLCALC, LDLDIRECT, LABVLDL    Wt Readings from Last 3 Encounters:  06/14/21 159 lb 12.8 oz (72.5 kg)  06/06/20 163 lb (73.9 kg)  11/18/19 169 lb 12.8 oz (77 kg)     Other studies Reviewed: Additional studies/ records that were reviewed today include: . Review of the above records demonstrates:   Assessment and Plan:   1. CAD without angina: No chest pain. Will continue ASA, Zetia, statin and beta blocker.     2. Hyperlipidemia: Lipids followed in primary care. LDL 74 in February 2022. Will continue statin. He wishes to stop Zetia due to dry mouth. He will increase his Crestor to 5 mg 5 days per week. Liipids and LFTs in 12 weeks.   Current medicines are reviewed at length with the patient today.  The patient does not have concerns regarding medicines.  Disposition:   F/U with me in 12  months  Signed, March 2022, MD 06/14/2021 8:53 AM    [facility] [address] [place], [place], [place]  [place] Phone: [phone]; Fax: [phone]

## 2021-06-14 NOTE — Patient Instructions (Signed)
Medication Instructions:  Your physician has recommended you make the following change in your medication:  1.) stop Zetia 2.) increase rosuvastatin (Crestor) 5 mg to one tablet 5 days per week  *If you need a refill on your cardiac medications before your next appointment, please call your pharmacy*   Lab Work: In 3 months lipids/liver function   Testing/Procedures: none   Follow-Up: At [facility], you and your health needs are our priority.  As part of our continuing mission to provide you with exceptional heart care, we have created designated Provider Care Teams.  These Care Teams include your primary Cardiologist (physician) and Advanced Practice Providers (APPs -  Physician Assistants and Nurse Practitioners) who all work together to provide you with the care you need, when you need it.   Your next appointment:   12 month(s)  The format for your next appointment:   In Person  Provider:   You may see Verne Carrow, MD or one of the following Advanced Practice Providers on your designated Care Team:   Ronie Spies, PA-C Jacolyn Reedy, PA-C   Other Instructions

## 2021-06-16 ENCOUNTER — Other Ambulatory Visit: Payer: Self-pay | Admitting: Physician Assistant

## 2021-06-16 ENCOUNTER — Other Ambulatory Visit: Payer: Self-pay | Admitting: Cardiovascular Disease

## 2021-09-14 ENCOUNTER — Other Ambulatory Visit: Payer: Medicare Other

## 2021-09-14 ENCOUNTER — Other Ambulatory Visit: Payer: Self-pay

## 2021-09-14 DIAGNOSIS — E7800 Pure hypercholesterolemia, unspecified: Secondary | ICD-10-CM

## 2021-09-14 DIAGNOSIS — I2510 Atherosclerotic heart disease of native coronary artery without angina pectoris: Secondary | ICD-10-CM

## 2021-09-14 LAB — LIPID PANEL
Chol/HDL Ratio: 3.2 ratio (ref 0.0–5.0)
Cholesterol, Total: 143 mg/dL (ref 100–199)
HDL: 45 mg/dL (ref >39)
LDL Chol Calc (NIH): 75 mg/dL (ref 0–99)
Triglycerides: 129 mg/dL (ref 0–149)
VLDL Cholesterol Cal: 23 mg/dL (ref 5–40)

## 2021-09-14 LAB — HEPATIC FUNCTION PANEL
ALT: 38 IU/L (ref 0–44)
AST: 44 IU/L — ABNORMAL HIGH (ref 0–40)
Albumin: 4.5 g/dL (ref 3.7–4.7)
Alkaline Phosphatase: 79 IU/L (ref 44–121)
Bilirubin Total: 1 mg/dL (ref 0.0–1.2)
Bilirubin, Direct: 0.27 mg/dL (ref 0.00–0.40)
Total Protein: 6.5 g/dL (ref 6.0–8.5)

## 2022-01-18 DIAGNOSIS — R6884 Jaw pain: Secondary | ICD-10-CM | POA: Insufficient documentation

## 2022-03-13 ENCOUNTER — Other Ambulatory Visit: Payer: Self-pay | Admitting: Cardiovascular Disease

## 2022-03-17 ENCOUNTER — Other Ambulatory Visit: Payer: Self-pay | Admitting: Cardiovascular Disease

## 2022-06-13 ENCOUNTER — Other Ambulatory Visit: Payer: Self-pay

## 2022-06-13 MED ORDER — IRBESARTAN 150 MG PO TABS: 150.0000 mg | ORAL_TABLET | Freq: Every day | ORAL | 0 refills | Status: DC

## 2022-06-14 ENCOUNTER — Other Ambulatory Visit: Payer: Self-pay

## 2022-06-14 MED ORDER — IRBESARTAN 150 MG PO TABS: 150.0000 mg | ORAL_TABLET | Freq: Every day | ORAL | 1 refills | Status: DC

## 2022-06-14 NOTE — Telephone Encounter (Signed)
Pt's medication was sent to pt's pharmacy as requested. Confirmation received.  °

## 2022-07-06 ENCOUNTER — Ambulatory Visit (INDEPENDENT_AMBULATORY_CARE_PROVIDER_SITE_OTHER): Payer: Medicare Other | Admitting: Cardiovascular Disease

## 2022-07-06 ENCOUNTER — Encounter: Payer: Self-pay | Admitting: Cardiovascular Disease

## 2022-07-06 VITALS — BP 100/66 | HR 63 | Ht 67.0 in | Wt 165.4 lb

## 2022-07-06 DIAGNOSIS — I2510 Atherosclerotic heart disease of native coronary artery without angina pectoris: Secondary | ICD-10-CM

## 2022-07-06 DIAGNOSIS — E7800 Pure hypercholesterolemia, unspecified: Secondary | ICD-10-CM | POA: Diagnosis not present

## 2022-07-06 MED ORDER — METOPROLOL TARTRATE 25 MG PO TABS: 25.0000 mg | ORAL_TABLET | Freq: Two times a day (BID) | ORAL | 3 refills | Status: DC

## 2022-07-06 NOTE — Patient Instructions (Signed)
Medication Instructions:  ?Your physician recommends that you continue on your current medications as directed. Please refer to the Current Medication list given to you today. ? ?*If you need a refill on your cardiac medications before your next appointment, please call your pharmacy* ? ? ?Lab Work: ?None ordered ? ?If you have labs (blood work) drawn today and your tests are completely normal, you will receive your results only by: ?MyChart Message (if you have MyChart) OR ?A paper copy in the mail ?If you have any lab test that is abnormal or we need to change your treatment, we will call you to review the results. ? ? ?Testing/Procedures: ?None ordered ? ? ?Follow-Up: ?At [facility], you and your health needs are our priority.  As part of our continuing mission to provide you with exceptional heart care, we have created designated Provider Care Teams.  These Care Teams include your primary Cardiologist (physician) and Advanced Practice Providers (APPs -  Physician Assistants and Nurse Practitioners) who all work together to provide you with the care you need, when you need it. ? ?We recommend signing up for the patient portal called "MyChart".  Sign up information is provided on this After Visit Summary.  MyChart is used to connect with patients for Virtual Visits (Telemedicine).  Patients are able to view lab/test results, encounter notes, upcoming appointments, etc.  Non-urgent messages can be sent to your provider as well.   ?To learn more about what you can do with MyChart, go to [email or web address].   ? ?Your next appointment:   ?12 month(s) ? ?The format for your next appointment:   ?In Person ? ?Provider:   ?Christopher McAlhany, MD   ? ? ?Other Instructions ? ? ?Important Information About Sugar ? ? ? ? ? ? ?

## 2022-07-06 NOTE — Progress Notes (Signed)
Chief Complaint  Patient presents with   Follow-up    CAD    History of Present Illness: 71 yo male with history of CAD, HTN, hyperlipidemia and GERD who is here today for cardiac follow up. He was evaluated in November 2017 by Dr. Donnie Aho for coronary calcification and had a stress test that showed ischemia. I performed a cardiac cath on 09/21/16 which showed a calcified 99% mid RCA stenosis treated with [facility] orbital atherectomy of the RCA followed by placement of a Synergy DES x 1 in the mid RCA. He did well following the procedure. Echo 05/20/17 with normal LV systolic function and no valve disease. Nuclear stress test September 2020 without ischemia.   He is here today for follow up. The patient denies any chest pain, dyspnea, palpitations, lower extremity edema, orthopnea, PND, dizziness, near syncope or syncope. \   Primary Care Physician: Alfonso Patten, MD  Past Medical History:  Diagnosis Date   Arthritis    Asthma    seasonal asthma as a child   BPH (benign prostatic hypertrophy)    Coronary artery disease    stent 2017   GERD (gastroesophageal reflux disease)    H/O seasonal allergies    Hyperlipemia    Hypertension    Prostatitis     Past Surgical History:  Procedure Laterality Date   APPENDECTOMY     CARDIAC CATHETERIZATION N/A 09/21/2016   Procedure: Left Heart Cath and Coronary Angiography;  Surgeon: [name], MD;  Location[facility] INVASIVE CV LAB;  Service: Cardiovascular;  Laterality: N/A;   CARDIAC CATHETERIZATION N/A 09/24/2016   Procedure: Coronary Stent Intervention;  Surgeon: [name], MD;  Location: [facility] INVASIVE CV LAB;  Service: Cardiovascular;  Laterality: N/A;   CARDIAC CATHETERIZATION N/A 09/24/2016   Procedure: Coronary/Graft Atherectomy;  Surgeon: [name], MD;  Location: [facility] INVASIVE CV LAB;  Service: Cardiovascular;  Laterality: N/A;   CARDIAC CATHETERIZATION N/A 09/24/2016   Procedure:  Temporary Pacemaker;  Surgeon: [name], MD;  Location: [facility] INVASIVE CV LAB;  Service: Cardiovascular;  Laterality: N/A;   CARDIOVASCULAR STRESS TEST  09/14/2016   COLONOSCOPY     CORONARY STENT PLACEMENT     JOINT REPLACEMENT     left hip dr. Charlann Boxer 08-05-18   LAPAROSCOPIC APPENDECTOMY N/A 07/14/2016   Procedure: APPENDECTOMY LAPAROSCOPIC;  Surgeon: Ovidio Kin, MD;  Location: [facility] ORS;  Service: General;  Laterality: N/A;   TOTAL HIP ARTHROPLASTY     right 2012   TOTAL HIP ARTHROPLASTY Left 08/05/2018   Procedure: LEFT TOTAL HIP ARTHROPLASTY ANTERIOR APPROACH;  Surgeon: Durene Romans, MD;  Location: [facility] ORS;  Service: Orthopedics;  Laterality: Left;  70 mins   WISDOM TOOTH EXTRACTION      Current Outpatient Medications  Medication Sig Dispense Refill   aspirin EC 81 MG tablet Take 81 mg by mouth every evening.      calcium carbonate (TUMS - DOSED IN MG ELEMENTAL CALCIUM) 500 MG chewable tablet Chew 1 tablet by mouth daily as needed for heartburn.      cetirizine (ZYRTEC) 10 MG tablet Take 10 mg by mouth daily as needed for allergies.     cholecalciferol (VITAMIN D3) 25 MCG (1000 UT) tablet Take 1,000 Units by mouth daily.     Coenzyme Q10 (COQ-10) 100 MG CAPS Take 100 mg by mouth 2 (two) times daily.     Cyanocobalamin 1000 MCG TBCR Take 1,000 mcg by mouth daily.      diphenhydrAMINE (BENADRYL)  25 MG tablet Take 25 mg by mouth daily as needed for allergies.     docusate sodium (COLACE) 100 MG capsule Take 1 capsule (100 mg total) by mouth 2 (two) times daily. 10 capsule 0   fluticasone (FLONASE) 50 MCG/ACT nasal spray Place 1 spray into both nostrils daily as needed for allergies or rhinitis.     folic acid (FOLVITE) 400 MCG tablet Take 400 mcg by mouth daily.     irbesartan (AVAPRO) 150 MG tablet Take 1 tablet (150 mg total) by mouth daily. 90 tablet 1   Ketotifen Fumarate (ALAWAY OP) Place 1 drop into both eyes daily as needed (allergies).     Krill Oil 500 MG CAPS Take 500 mg  by mouth daily.     Melatonin 3 MG TABS Take 3 mg by mouth at bedtime as needed (sleep).     montelukast (SINGULAIR) 10 MG tablet Take 10 mg by mouth daily as needed (allergies).   1   niacin (SLO-NIACIN) 500 MG tablet Take 500 mg by mouth daily.     nitroGLYCERIN (NITROSTAT) 0.4 MG SL tablet DISSOLVE 1 TAB UNDER TONGUE FOR CHEST PAIN - IF PAIN REMAINS AFTER 5 MIN, CALL 911 AND REPEAT DOSE. MAX 3 TABS IN 15 MINUTES 25 tablet 5   Plant Sterol Stanol-Pantethine 300-100 MG CAPS Take 2 capsules by mouth every evening.      psyllium (REGULOID) 0.52 g capsule Take 0.52 g by mouth daily.     rosuvastatin (CRESTOR) 5 MG tablet TAKE 1 TABLET BY MOUTH FIVE TIMES A WEEK 65 tablet 1   metoprolol tartrate (LOPRESSOR) 25 MG tablet Take 1 tablet (25 mg total) by mouth 2 (two) times daily. 180 tablet 3   No current facility-administered medications for this visit.    Allergies  Allergen Reactions   Penicillins Swelling and Other (See Comments)    Dyspnea, hair loss-childhood allergy Has patient had a PCN reaction causing immediate rash, facial/tongue/throat swelling, SOB or lightheadedness with hypotension:unsure Has patient had a PCN reaction causing severe rash involving mucus membranes or skin necrosis:unsure Has patient had a PCN reaction that required hospitalization:unsure Has patient had a PCN reaction occurring within the last 10 years:No If all of the above answers are "NO", then may proceed with Cephalosporin use.     Atorvastatin Calcium Other (See Comments)    LFT increased LFT increased   Rosuvastatin     In high doses caused levated LFT Joint muscle pain      Social History   Socioeconomic History   Marital status: Married    Spouse name: Not on file   Number of children: 2   Years of education: Not on file   Highest education level: Not on file  Occupational History   Occupation: sales  Tobacco Use   Smoking status: Former    Packs/day: 0.50    Years: 12.00    Total pack  years: 6.00    Types: Cigarettes    Quit date: 11/17/1978    Years since quitting: 43.6   Smokeless tobacco: Never  Vaping Use   Vaping Use: Never used  Substance and Sexual Activity   Alcohol use: Yes    Alcohol/week: 1.0 standard drink of alcohol    Types: 1 Glasses of wine per week    Comment: rare   Drug use: No   Sexual activity: Yes  Other Topics Concern   Not on file  Social History Narrative   Not on file   Social Determinants  of Health   Financial Resource Strain: Not on file  Food Insecurity: Not on file  Transportation Needs: Not on file  Physical Activity: Not on file  Stress: Not on file  Social Connections: Not on file  Intimate Partner Violence: Not on file    Family History  Problem Relation Age of Onset   Coronary artery disease Mother 69   CVA Mother    Heart attack Father 12   Microcephaly Paternal Uncle    Heart attack Paternal Uncle     Review of Systems:  As stated in the HPI and otherwise negative.   BP 100/66   Pulse 63   Ht 5\' 7"  (1.702 m)   Wt 165 lb 6.4 oz (75 kg)   SpO2 98%   BMI 25.91 kg/m   Physical Examination:  General: Well developed, well nourished, NAD  HEENT: OP clear, mucus membranes moist  SKIN: warm, dry. No rashes. Neuro: No focal deficits  Musculoskeletal: Muscle strength 5/5 all ext  Psychiatric: Mood and affect normal  Neck: No JVD, no carotid bruits, no thyromegaly, no lymphadenopathy.  Lungs:Clear bilaterally, no wheezes, rhonci, crackles Cardiovascular: Regular rate and rhythm. No murmurs, gallops or rubs. Abdomen:Soft. Bowel sounds present. Non-tender.  Extremities: No lower extremity edema. Pulses are 2 + in the bilateral DP/PT.  Echo 05/20/17: - Left ventricle: The cavity size was normal. Systolic function was   normal. The estimated ejection fraction was in the range of 55%   to 60%. Wall motion was normal; there were no regional wall   motion abnormalities. Left ventricular diastolic function    parameters were normal.  EKG:  EKG is ordered today. The ekg ordered today demonstrates NSR  Recent Labs: 09/14/2021: ALT 38   Lipid Pane Lipid Panel     Component Value Date/Time   CHOL 143 09/14/2021 0731   TRIG 129 09/14/2021 0731   HDL 45 09/14/2021 0731   CHOLHDL 3.2 09/14/2021 0731   LDLCALC 75 09/14/2021 0731   LABVLDL 23 09/14/2021 0731      Wt Readings from Last 3 Encounters:  07/06/22 165 lb 6.4 oz (75 kg)  06/14/21 159 lb 12.8 oz (72.5 kg)  06/06/20 163 lb (73.9 kg)     Other studies Reviewed: Additional studies/ records that were reviewed today include: . Review of the above records demonstrates:   Assessment and Plan:   1. CAD without angina: No chest pain concerning for angina. Continue ASA, statin and beta blocker.      2. Hyperlipidemia: Lipids followed in primary care. LDL near goal in November 06/08/20. Continue statin. .   Current medicines are reviewed at length with the patient today.  The patient does not have concerns regarding medicines.  Disposition:   F/U with me in 12  months  Signed, [id number], MD 07/06/2022 2:41 PM    [facility] [address] [place], [place], [place]  [place] Phone: [phone]; Fax: [phone]

## 2022-08-09 ENCOUNTER — Other Ambulatory Visit: Payer: Self-pay | Admitting: Cardiovascular Disease

## 2022-09-24 ENCOUNTER — Ambulatory Visit: Payer: Medicare Other | Admitting: Cardiovascular Disease

## 2023-01-01 ENCOUNTER — Other Ambulatory Visit: Payer: Self-pay | Admitting: Cardiovascular Disease

## 2023-01-21 ENCOUNTER — Telehealth: Payer: Self-pay | Admitting: Cardiovascular Disease

## 2023-01-21 MED ORDER — NITROGLYCERIN 0.4 MG SL SUBL: SUBLINGUAL_TABLET | SUBLINGUAL | 5 refills | Status: DC

## 2023-01-21 NOTE — Telephone Encounter (Signed)
Spoke with the patient who reports that he has been having tightness in his chest that comes and goes. He states that this has been going on for about two weeks now. He states that it is not chest pain but just uncomfortable. He denies any triggers associated with the tightness. He has noticed it both when he has been walking as well as at rest. He denies any other associated symptoms such as N/V, shortness of breath, lightheadedness or dizziness. He reports his blood pressures have been okay but does not have any readings to report. He has nitroglycerin on his medication list however he states that it is expired so he has not taken any. Patient's nitro has been refilled and he has been educated on how to use it as well as ER precautions. I scheduled him for an appointment with Dr. Angelena Form tomorrow morning. Will make him and his nurse aware for any further recommendations prior to his appointment.

## 2023-01-21 NOTE — Telephone Encounter (Signed)
Pt c/o of Chest Pain: STAT if CP now or developed within 24 hours  1. Are you having CP right now? Yes; tightness in chest  2. Are you experiencing any other symptoms (ex. SOB, nausea, vomiting, sweating)? Across chest around left shoulder  3. How long have you been experiencing CP? About 2 weeks   4. Is your CP continuous or coming and going? Coming and going  5. Have you taken Nitroglycerin? No  ?

## 2023-01-21 NOTE — Progress Notes (Signed)
Chief Complaint  Patient presents with   Follow-up    CAD/chest pain   History of Present Illness: 72 yo male with history of CAD, HTN, hyperlipidemia and GERD who is here today for cardiac follow up. He was evaluated in November 2017 by Dr. Donnie Aho for coronary calcification and had a stress test that showed ischemia. I performed a cardiac cath on 09/21/16 which showed a calcified 99% mid RCA stenosis treated with [facility] orbital atherectomy of the RCA followed by placement of a Synergy DES x 1 in the mid RCA. He did well following the procedure. Echo 05/20/17 with normal LV systolic function and no valve disease. Nuclear stress test September 2020 without ischemia.   He is here today for follow up. He called our office to report chest pain  at rest and mild chest pressure with exertion. He feels a slight tightness in his chest when walking at the gym. The pain all around his chest with no clear pattern. He has been doing upper body weight workouts. His BP has been in the 150s that the last few mornings at home. He denies dyspnea, palpitations, lower extremity edema, orthopnea, PND, dizziness, near syncope or syncope.    Primary Care Physician: Alfonso Patten, MD  Past Medical History:  Diagnosis Date   Arthritis    Asthma    seasonal asthma as a child   BPH (benign prostatic hypertrophy)    Coronary artery disease    stent 2017   GERD (gastroesophageal reflux disease)    H/O seasonal allergies    Hyperlipemia    Hypertension    Prostatitis     Past Surgical History:  Procedure Laterality Date   APPENDECTOMY     CARDIAC CATHETERIZATION N/A 09/21/2016   Procedure: Left Heart Cath and Coronary Angiography;  Surgeon: [name], MD;  Location: [facility] INVASIVE CV LAB;  Service: Cardiovascular;  Laterality: N/A;   CARDIAC CATHETERIZATION N/A 09/24/2016   Procedure: Coronary Stent Intervention;  Surgeon: [name], MD;  Location: [facility] INVASIVE CV LAB;   Service: Cardiovascular;  Laterality: N/A;   CARDIAC CATHETERIZATION N/A 09/24/2016   Procedure: Coronary/Graft Atherectomy;  Surgeon: [name], MD;  Location: [facility] INVASIVE CV LAB;  Service: Cardiovascular;  Laterality: N/A;   CARDIAC CATHETERIZATION N/A 09/24/2016   Procedure: Temporary Pacemaker;  Surgeon: [name], MD;  Location: [facility] INVASIVE CV LAB;  Service: Cardiovascular;  Laterality: N/A;   CARDIOVASCULAR STRESS TEST  09/14/2016   COLONOSCOPY     CORONARY STENT PLACEMENT     JOINT REPLACEMENT     left hip dr. Charlann Boxer 08-05-18   LAPAROSCOPIC APPENDECTOMY N/A 07/14/2016   Procedure: APPENDECTOMY LAPAROSCOPIC;  Surgeon: Ovidio Kin, MD;  Location: [facility] ORS;  Service: General;  Laterality: N/A;   TOTAL HIP ARTHROPLASTY     right 2012   TOTAL HIP ARTHROPLASTY Left 08/05/2018   Procedure: LEFT TOTAL HIP ARTHROPLASTY ANTERIOR APPROACH;  Surgeon: Durene Romans, MD;  Location: [facility] ORS;  Service: Orthopedics;  Laterality: Left;  70 mins   WISDOM TOOTH EXTRACTION      Current Outpatient Medications  Medication Sig Dispense Refill   aspirin EC 81 MG tablet Take 81 mg by mouth every evening.      calcium carbonate (TUMS - DOSED IN MG ELEMENTAL CALCIUM) 500 MG chewable tablet Chew 1 tablet by mouth daily as needed for heartburn.      cetirizine (ZYRTEC) 10 MG tablet Take 10 mg by mouth daily as needed for allergies.  cholecalciferol (VITAMIN D3) 25 MCG (1000 UT) tablet Take 1,000 Units by mouth daily.     Coenzyme Q10 (COQ-10) 100 MG CAPS Take 100 mg by mouth 2 (two) times daily.     Cyanocobalamin 1000 MCG TBCR Take 1,000 mcg by mouth daily.      diphenhydrAMINE (BENADRYL) 25 MG tablet Take 25 mg by mouth daily as needed for allergies.     fluticasone (FLONASE) 50 MCG/ACT nasal spray Place 1 spray into both nostrils daily as needed for allergies or rhinitis.     folic acid (FOLVITE) 400 MCG tablet Take 400 mcg by mouth daily.     irbesartan (AVAPRO) 150 MG tablet Take 1  tablet (150 mg total) by mouth in the morning and at bedtime. 180 tablet 3   Ketotifen Fumarate (ALAWAY OP) Place 1 drop into both eyes daily as needed (allergies).     Krill Oil 500 MG CAPS Take 500 mg by mouth daily.     Melatonin 3 MG TABS Take 3 mg by mouth at bedtime as needed (sleep).     metoprolol tartrate (LOPRESSOR) 25 MG tablet Take 1 tablet (25 mg total) by mouth 2 (two) times daily. 180 tablet 3   montelukast (SINGULAIR) 10 MG tablet Take 10 mg by mouth daily as needed (allergies).   1   niacin (SLO-NIACIN) 500 MG tablet Take 500 mg by mouth daily.     nitroGLYCERIN (NITROSTAT) 0.4 MG SL tablet DISSOLVE 1 TAB UNDER TONGUE FOR CHEST PAIN - IF PAIN REMAINS AFTER 5 MIN, CALL 911 AND REPEAT DOSE. MAX 3 TABS IN 15 MINUTES 25 tablet 5   psyllium (REGULOID) 0.52 g capsule Take 0.52 g by mouth daily.     rosuvastatin (CRESTOR) 5 MG tablet TAKE 1 TABLET BY MOUTH FIVE TIMES A WEEK 75 tablet 3   No current facility-administered medications for this visit.    Allergies  Allergen Reactions   Penicillins Swelling and Other (See Comments)    Dyspnea, hair loss-childhood allergy Has patient had a PCN reaction causing immediate rash, facial/tongue/throat swelling, SOB or lightheadedness with hypotension:unsure Has patient had a PCN reaction causing severe rash involving mucus membranes or skin necrosis:unsure Has patient had a PCN reaction that required hospitalization:unsure Has patient had a PCN reaction occurring within the last 10 years:No If all of the above answers are "NO", then may proceed with Cephalosporin use.     Atorvastatin Calcium Other (See Comments)    LFT increased LFT increased   Rosuvastatin     In high doses caused levated LFT Joint muscle pain      Social History   Socioeconomic History   Marital status: Married    Spouse name: Not on file   Number of children: 2   Years of education: Not on file   Highest education level: Not on file  Occupational History    Occupation: sales  Tobacco Use   Smoking status: Former    Packs/day: 0.50    Years: 12.00    Total pack years: 6.00    Types: Cigarettes    Quit date: 11/17/1978    Years since quitting: 44.2   Smokeless tobacco: Never  Vaping Use   Vaping Use: Never used  Substance and Sexual Activity   Alcohol use: Yes    Alcohol/week: 1.0 standard drink of alcohol    Types: 1 Glasses of wine per week    Comment: rare   Drug use: No   Sexual activity: Yes  Other Topics Concern  Not on file  Social History Narrative   Not on file   Social Determinants of Health   Financial Resource Strain: Not on file  Food Insecurity: Not on file  Transportation Needs: Not on file  Physical Activity: Not on file  Stress: Not on file  Social Connections: Not on file  Intimate Partner Violence: Not on file    Family History  Problem Relation Age of Onset   Coronary artery disease Mother 92   CVA Mother    Heart attack Father 54   Microcephaly Paternal Uncle    Heart attack Paternal Uncle     Review of Systems:  As stated in the HPI and otherwise negative.   BP 118/68   Pulse 65   Ht 5\' 7"  (1.702 m)   Wt 74.4 kg   SpO2 98%   BMI 25.69 kg/m   Physical Examination: General: Well developed, well nourished, NAD  HEENT: OP clear, mucus membranes moist  SKIN: warm, dry. No rashes. Neuro: No focal deficits  Musculoskeletal: Muscle strength 5/5 all ext  Psychiatric: Mood and affect normal  Neck: No JVD, no carotid bruits, no thyromegaly, no lymphadenopathy.  Lungs:Clear bilaterally, no wheezes, rhonci, crackles Cardiovascular: Regular rate and rhythm. No murmurs, gallops or rubs. Abdomen:Soft. Bowel sounds present. Non-tender.  Extremities: No lower extremity edema. Pulses are 2 + in the bilateral DP/PT.  Echo 05/20/17: - Left ventricle: The cavity size was normal. Systolic function was   normal. The estimated ejection fraction was in the range of 55%   to 60%. Wall motion was normal;  there were no regional wall   motion abnormalities. Left ventricular diastolic function   parameters were normal.  EKG:  EKG is ordered today. The ekg ordered today demonstrates NSR, non-specific ST abn  Recent Labs: No results found for requested labs within last 365 days.   Lipid Pane Lipid Panel     Component Value Date/Time   CHOL 143 09/14/2021 0731   TRIG 129 09/14/2021 0731   HDL 45 09/14/2021 0731   CHOLHDL 3.2 09/14/2021 0731   LDLCALC 75 09/14/2021 0731   LABVLDL 23 09/14/2021 0731      Wt Readings from Last 3 Encounters:  01/22/23 74.4 kg  07/06/22 75 kg  06/14/21 72.5 kg    Assessment and Plan:   1. CAD with angina: He has chest pain at rest and with exertion. EKG unchanged today. Will arrange exercise nuclear stress test to exclude ischemia. Continue ASA, statin and beta blocker.       2. Hyperlipidemia: Lipids followed in primary care. Continue statin.    3. HTN: BP has been elevated at home. Will increase Irbesartan to 150 mg po BID and continue Lopressor 25 mg po BID  Disposition:   F/U with me in one month.   Signed, Verne Carrow, MD 01/22/2023 12:37 PM    [facility] [address] [place], [place], [place]  [address] Phone: [phone]; Fax: [phone]

## 2023-01-22 ENCOUNTER — Encounter: Payer: Self-pay | Admitting: *Deleted

## 2023-01-22 ENCOUNTER — Encounter: Payer: Self-pay | Admitting: Cardiovascular Disease

## 2023-01-22 ENCOUNTER — Ambulatory Visit: Payer: Medicare Other | Attending: Cardiovascular Disease | Admitting: Cardiovascular Disease

## 2023-01-22 VITALS — BP 118/68 | HR 65 | Ht 67.0 in | Wt 164.0 lb

## 2023-01-22 DIAGNOSIS — I25118 Atherosclerotic heart disease of native coronary artery with other forms of angina pectoris: Secondary | ICD-10-CM | POA: Diagnosis present

## 2023-01-22 DIAGNOSIS — E7800 Pure hypercholesterolemia, unspecified: Secondary | ICD-10-CM

## 2023-01-22 DIAGNOSIS — I10 Essential (primary) hypertension: Secondary | ICD-10-CM | POA: Diagnosis present

## 2023-01-22 MED ORDER — IRBESARTAN 150 MG PO TABS: 150.0000 mg | ORAL_TABLET | Freq: Two times a day (BID) | ORAL | 3 refills | Status: DC

## 2023-01-22 NOTE — Patient Instructions (Signed)
Medication Instructions:  Your physician has recommended you make the following change in your medication:  1.) increase irbesartan 150 mg to 1 tablet twice a day  *If you need a refill on your cardiac medications before your next appointment, please call your pharmacy*   Lab Work: Today: bmet   Testing/Procedures: Your physician has requested that you have en exercise stress myoview. For further information please visit [email or web address]. Please follow instruction sheet, as given.   Follow-Up: As planned

## 2023-01-23 ENCOUNTER — Telehealth (HOSPITAL_COMMUNITY): Payer: Self-pay | Admitting: *Deleted

## 2023-01-23 LAB — BASIC METABOLIC PANEL
BUN/Creatinine Ratio: 11 (ref 10–24)
BUN: 10 mg/dL (ref 8–27)
CO2: 23 mmol/L (ref 20–29)
Calcium: 9.9 mg/dL (ref 8.6–10.2)
Chloride: 102 mmol/L (ref 96–106)
Creatinine, Ser: 0.92 mg/dL (ref 0.76–1.27)
Glucose: 89 mg/dL (ref 70–99)
Potassium: 4.9 mmol/L (ref 3.5–5.2)
Sodium: 140 mmol/L (ref 134–144)
eGFR: 88 mL/min/{1.73_m2} (ref >59)

## 2023-01-23 NOTE — Telephone Encounter (Signed)
Patient given detailed instructions per Myocardial Perfusion Study Information Sheet for the test on 01/30/23. Patient notified to arrive 15 minutes early and that it is imperative to arrive on time for appointment to keep from having the test rescheduled.  If you need to cancel or reschedule your appointment, please call the office within 24 hours of your appointment. . Patient verbalized understanding. Kirstie Peri

## 2023-01-30 ENCOUNTER — Ambulatory Visit (HOSPITAL_COMMUNITY): Payer: Medicare Other | Attending: Cardiovascular Disease

## 2023-01-30 DIAGNOSIS — I25118 Atherosclerotic heart disease of native coronary artery with other forms of angina pectoris: Secondary | ICD-10-CM | POA: Insufficient documentation

## 2023-01-30 LAB — MYOCARDIAL PERFUSION IMAGING
Angina Index: 0
Duke Treadmill Score: 6
Estimated workload: 7
Exercise duration (min): 6 min
Exercise duration (sec): 3 s
LV dias vol: 79 mL (ref 62–150)
LV sys vol: 33 mL
MPHR: 147 {beats}/min
Nuc Stress EF: 58 %
Peak HR: 130 {beats}/min
Percent HR: 88 %
Rest HR: 62 {beats}/min
Rest Nuclear Isotope Dose: 10.4 mCi
SDS: 1
SRS: 0
SSS: 1
ST Depression (mm): 0 mm
Stress Nuclear Isotope Dose: 31.4 mCi
TID: 0.96

## 2023-01-30 MED ORDER — TECHNETIUM TC 99M TETROFOSMIN IV KIT
31.4000 | PACK | Freq: Once | INTRAVENOUS | Status: AC | PRN
  Administered 2023-01-30: 31.4 via INTRAVENOUS

## 2023-01-30 MED ORDER — TECHNETIUM TC 99M TETROFOSMIN IV KIT
10.4000 | PACK | Freq: Once | INTRAVENOUS | Status: AC | PRN
  Administered 2023-01-30: 10.4 via INTRAVENOUS

## 2023-02-25 ENCOUNTER — Encounter: Payer: Self-pay | Admitting: Cardiovascular Disease

## 2023-02-25 ENCOUNTER — Ambulatory Visit: Payer: Medicare Other | Attending: Cardiovascular Disease | Admitting: Cardiovascular Disease

## 2023-02-25 VITALS — BP 108/60 | HR 60 | Ht 66.0 in | Wt 160.0 lb

## 2023-02-25 DIAGNOSIS — I25118 Atherosclerotic heart disease of native coronary artery with other forms of angina pectoris: Secondary | ICD-10-CM

## 2023-02-25 DIAGNOSIS — I10 Essential (primary) hypertension: Secondary | ICD-10-CM

## 2023-02-25 DIAGNOSIS — E7800 Pure hypercholesterolemia, unspecified: Secondary | ICD-10-CM

## 2023-02-25 NOTE — Progress Notes (Signed)
Chief Complaint  Patient presents with   Follow-up    CAD/Chest pain   History of Present Illness: 72 yo male with history of CAD, HTN, hyperlipidemia and GERD who is here today for cardiac follow up. He was evaluated in November 2017 by Dr. Donnie Aho for coronary calcification and had a stress test that showed ischemia. I performed a cardiac cath on 09/21/16 which showed a calcified 99% mid RCA stenosis treated with [facility] orbital atherectomy of the RCA followed by placement of a Synergy DES x 1 in the mid RCA. He did well following the procedure. Echo 05/20/17 with normal LV systolic function and no valve disease. Nuclear stress test September 2020 without ischemia. I saw him on 01/22/23 and he c/o mild chest pressure with exertion. Nuclear stress test 01/30/23 with no ischemia.   He is here today for follow up. The patient denies any dyspnea, palpitations, lower extremity edema, orthopnea, PND, dizziness, near syncope or syncope. His BP is much better at home. He has had improvement overall in his chest pain. Still occasionally having mild pressure with moderate exertion.    Primary Care Physician: Alfonso Patten, MD  Past Medical History:  Diagnosis Date   Arthritis    Asthma    seasonal asthma as a child   BPH (benign prostatic hypertrophy)    Coronary artery disease    stent 2017   GERD (gastroesophageal reflux disease)    H/O seasonal allergies    Hyperlipemia    Hypertension    Prostatitis     Past Surgical History:  Procedure Laterality Date   APPENDECTOMY     CARDIAC CATHETERIZATION N/A 09/21/2016   Procedure: Left Heart Cath and Coronary Angiography;  Surgeon: [name], MD;  Location: [facility] INVASIVE CV LAB;  Service: Cardiovascular;  Laterality: N/A;   CARDIAC CATHETERIZATION N/A 09/24/2016   Procedure: Coronary Stent Intervention;  Surgeon: [name], MD;  Location: [facility] INVASIVE CV LAB;  Service: Cardiovascular;  Laterality: N/A;    CARDIAC CATHETERIZATION N/A 09/24/2016   Procedure: Coronary/Graft Atherectomy;  Surgeon: [name], MD;  Location: [facility] INVASIVE CV LAB;  Service: Cardiovascular;  Laterality: N/A;   CARDIAC CATHETERIZATION N/A 09/24/2016   Procedure: Temporary Pacemaker;  Surgeon: [name], MD;  Location: [facility] INVASIVE CV LAB;  Service: Cardiovascular;  Laterality: N/A;   CARDIOVASCULAR STRESS TEST  09/14/2016   COLONOSCOPY     CORONARY STENT PLACEMENT     JOINT REPLACEMENT     left hip dr. Charlann Boxer 08-05-18   LAPAROSCOPIC APPENDECTOMY N/A 07/14/2016   Procedure: APPENDECTOMY LAPAROSCOPIC;  Surgeon: Ovidio Kin, MD;  Location: [facility] ORS;  Service: General;  Laterality: N/A;   TOTAL HIP ARTHROPLASTY     right 2012   TOTAL HIP ARTHROPLASTY Left 08/05/2018   Procedure: LEFT TOTAL HIP ARTHROPLASTY ANTERIOR APPROACH;  Surgeon: Durene Romans, MD;  Location: [facility] ORS;  Service: Orthopedics;  Laterality: Left;  70 mins   WISDOM TOOTH EXTRACTION      Current Outpatient Medications  Medication Sig Dispense Refill   aspirin EC 81 MG tablet Take 81 mg by mouth every evening.      calcium carbonate (TUMS - DOSED IN MG ELEMENTAL CALCIUM) 500 MG chewable tablet Chew 1 tablet by mouth daily as needed for heartburn.      cetirizine (ZYRTEC) 10 MG tablet Take 10 mg by mouth daily as needed for allergies.     cholecalciferol (VITAMIN D3) 25 MCG (1000 UT) tablet Take 1,000 Units by  mouth daily.     Coenzyme Q10 (COQ-10) 100 MG CAPS Take 100 mg by mouth 2 (two) times daily.     Cyanocobalamin 1000 MCG TBCR Take 1,000 mcg by mouth daily.      diphenhydrAMINE (BENADRYL) 25 MG tablet Take 25 mg by mouth daily as needed for allergies.     fluticasone (FLONASE) 50 MCG/ACT nasal spray Place 1 spray into both nostrils daily as needed for allergies or rhinitis.     folic acid (FOLVITE) 400 MCG tablet Take 400 mcg by mouth daily.     irbesartan (AVAPRO) 150 MG tablet Take 1 tablet (150 mg total) by mouth in the morning  and at bedtime. 180 tablet 3   Ketotifen Fumarate (ALAWAY OP) Place 1 drop into both eyes daily as needed (allergies).     Krill Oil 500 MG CAPS Take 500 mg by mouth daily.     Melatonin 3 MG TABS Take 3 mg by mouth at bedtime as needed (sleep).     metoprolol tartrate (LOPRESSOR) 25 MG tablet Take 1 tablet (25 mg total) by mouth 2 (two) times daily. 180 tablet 3   montelukast (SINGULAIR) 10 MG tablet Take 10 mg by mouth daily as needed (allergies).   1   niacin (SLO-NIACIN) 500 MG tablet Take 500 mg by mouth daily.     nitroGLYCERIN (NITROSTAT) 0.4 MG SL tablet DISSOLVE 1 TAB UNDER TONGUE FOR CHEST PAIN - IF PAIN REMAINS AFTER 5 MIN, CALL 911 AND REPEAT DOSE. MAX 3 TABS IN 15 MINUTES 25 tablet 5   psyllium (REGULOID) 0.52 g capsule Take 0.52 g by mouth daily.     rosuvastatin (CRESTOR) 5 MG tablet TAKE 1 TABLET BY MOUTH FIVE TIMES A WEEK 75 tablet 3   No current facility-administered medications for this visit.    Allergies  Allergen Reactions   Penicillins Swelling and Other (See Comments)    Dyspnea, hair loss-childhood allergy Has patient had a PCN reaction causing immediate rash, facial/tongue/throat swelling, SOB or lightheadedness with hypotension:unsure Has patient had a PCN reaction causing severe rash involving mucus membranes or skin necrosis:unsure Has patient had a PCN reaction that required hospitalization:unsure Has patient had a PCN reaction occurring within the last 10 years:No If all of the above answers are "NO", then may proceed with Cephalosporin use.     Atorvastatin Calcium Other (See Comments)    LFT increased LFT increased   Rosuvastatin     In high doses caused levated LFT Joint muscle pain      Social History   Socioeconomic History   Marital status: Married    Spouse name: Not on file   Number of children: 2   Years of education: Not on file   Highest education level: Not on file  Occupational History   Occupation: sales  Tobacco Use   Smoking  status: Former    Packs/day: 0.50    Years: 12.00    Additional pack years: 0.00    Total pack years: 6.00    Types: Cigarettes    Quit date: 11/17/1978    Years since quitting: 44.3   Smokeless tobacco: Never  Vaping Use   Vaping Use: Never used  Substance and Sexual Activity   Alcohol use: Yes    Alcohol/week: 1.0 standard drink of alcohol    Types: 1 Glasses of wine per week    Comment: rare   Drug use: No   Sexual activity: Yes  Other Topics Concern   Not on file  Social History Narrative   Not on file   Social Determinants of Health   Financial Resource Strain: Not on file  Food Insecurity: Not on file  Transportation Needs: Not on file  Physical Activity: Not on file  Stress: Not on file  Social Connections: Not on file  Intimate Partner Violence: Not on file    Family History  Problem Relation Age of Onset   Coronary artery disease Mother 67   CVA Mother    Heart attack Father 78   Microcephaly Paternal Uncle    Heart attack Paternal Uncle     Review of Systems:  As stated in the HPI and otherwise negative.   BP 108/60   Pulse 60   Ht 5\' 6"  (1.676 m)   Wt 72.6 kg   SpO2 97%   BMI 25.82 kg/m   Physical Examination: General: Well developed, well nourished, NAD  HEENT: OP clear, mucus membranes moist  SKIN: warm, dry. No rashes. Neuro: No focal deficits  Musculoskeletal: Muscle strength 5/5 all ext  Psychiatric: Mood and affect normal  Neck: No JVD, no carotid bruits, no thyromegaly, no lymphadenopathy.  Lungs:Clear bilaterally, no wheezes, rhonci, crackles Cardiovascular: Regular rate and rhythm. No murmurs, gallops or rubs. Abdomen:Soft. Bowel sounds present. Non-tender.  Extremities: No lower extremity edema. Pulses are 2 + in the bilateral DP/PT.  Echo 05/20/17: - Left ventricle: The cavity size was normal. Systolic function was   normal. The estimated ejection fraction was in the range of 55%   to 60%. Wall motion was normal; there were no  regional wall   motion abnormalities. Left ventricular diastolic function   parameters were normal.  EKG:  EKG is not ordered today. The ekg ordered today demonstrates   Recent Labs: 01/22/2023: BUN 10; Creatinine, Ser 0.92; Potassium 4.9; Sodium 140   Lipid Pane Lipid Panel     Component Value Date/Time   CHOL 143 09/14/2021 0731   TRIG 129 09/14/2021 0731   HDL 45 09/14/2021 0731   CHOLHDL 3.2 09/14/2021 0731   LDLCALC 75 09/14/2021 0731   LABVLDL 23 09/14/2021 0731      Wt Readings from Last 3 Encounters:  02/25/23 72.6 kg  01/30/23 74.4 kg  01/22/23 74.4 kg    Assessment and Plan:   1. CAD with angina: No ischemia on stress test 01/30/23. Chest pain is improved. Will continue ASA, statin and beta blocker. If he has continued mild chest pain, can consider adding Ranexa.   2. Hyperlipidemia: Lipids followed in primary care. Continue statin.    3. HTN: BP is better controlled on increased dose of irbesartan. Continue irbesartan and metoprolol.   Disposition:   F/U with me in six months  Signed, Verne Carrow, MD 02/25/2023 10:13 AM    [facility] [address] [place], [place], [place]  [address] Phone: [phone]; Fax: [phone]

## 2023-02-25 NOTE — Patient Instructions (Signed)
Medication Instructions:  No changes *If you need a refill on your cardiac medications before your next appointment, please call your pharmacy*   Lab Work: none If you have labs (blood work) drawn today and your tests are completely normal, you will receive your results only by: MyChart Message (if you have MyChart) OR A paper copy in the mail If you have any lab test that is abnormal or we need to change your treatment, we will call you to review the results.   Testing/Procedures: none   Follow-Up: At [facility], you and your health needs are our priority.  As part of our continuing mission to provide you with exceptional heart care, we have created designated Provider Care Teams.  These Care Teams include your primary Cardiologist (physician) and Advanced Practice Providers (APPs -  Physician Assistants and Nurse Practitioners) who all work together to provide you with the care you need, when you need it.  We recommend signing up for the patient portal called "MyChart".  Sign up information is provided on this After Visit Summary.  MyChart is used to connect with patients for Virtual Visits (Telemedicine).  Patients are able to view lab/test results, encounter notes, upcoming appointments, etc.  Non-urgent messages can be sent to your provider as well.   To learn more about what you can do with MyChart, go to [email or web address].    Your next appointment:   6 month(s)  Provider:   Verne Carrow, MD     Other Instructions

## 2023-02-28 ENCOUNTER — Encounter: Payer: Self-pay | Admitting: Cardiovascular Disease

## 2023-03-01 ENCOUNTER — Telehealth: Payer: Self-pay

## 2023-03-01 NOTE — Telephone Encounter (Signed)
Spoke to the patient, he stated the last time he was seen in the office he was experimenting chest tightness across his chest. Pt has stated the chest tightness is located on the left side of this chest, left side of his neck and back. He does work part time at [employer] 3 times a week, while at work he does use there BP machine to check his HR. On 4/16 HR was 80's-90's, 4/18 high 80's, and this morning HR 62. Pt mentioned his HR was stable until  there was a change in the dosage. He is currently on the way to see his granddaughter in [place] and will be back in town on Monday. Will forward to MD and nurse for advise.    Messages sent via MyChart:  Not worsened, but has moved.  The last time I was there it was across my entire chest now just the left side.  You  Sean December Brandi Jr. "G H Delk Schultz13 minutes ago (9:37 AM)    Molli Knock, just one more question. Has your chest tightness worsen since the last time you were seen in the office?  Sean December Corrie Jr. "G H Gilkison Jr."  P Cv Div Ch St Triage (supporting [name], MD)30 minutes ago (9:20 AM)    I still have some tightness in the left side of my chest.  My BP this morning was 122/78.   You  Sean H Rhymes Jr. "G H Hollenbeck Schultz1 hour ago (8:06 AM)    Good morning,   Are you experiencing any other symptoms?  Sean December Rolland Jr. "G H Rosenbach Jr."  P Cv Div Ch St Triage (supporting [name], MD)16 hours ago (5:14 PM)    Also, I have had some fatigue, but have been extremely busy for the last couple of weeks.  Sean December Diefenderfer Jr. "G H Wing Jr."  P Cv Div Ch St Triage (supporting [name], MD)16 hours ago (5:12 PM)    I haven't written the readings down, but pressure has been running in the 115-125 over 65-75 range.  My heart rate range is mostly in the 70's but then seems to spike to the 85-90 range.  Yes, the chest tightness I was experiencing when Dr Sanjuana Kava did the stress testing  last month hasn't changed.  I haven't been light headed  Sean Schultz, Sean CASTILLEJA Wanner Jr. "G H Papin Schultz17 hours ago (4:38 PM)    Hello Mr. Mcguire,   Do you have some heart rate and blood pressure readings for the last few days for [place] to send to Dr. Clifton James?   Have you been having any chest pain, shortness of breath, dizziness/lightheadedness or fatigue?   Danford Bad, RN  [name] "G H Knechtel Jr."  P Cv Div Ch St Triage (supporting [name], MD)17 hours ago (4:31 PM)    I have a call in to your office and waiting on a call back, but my heart rate has been running high for the last few days, should we consider increasing the metoprolol dosage?

## 2023-03-04 MED ORDER — METOPROLOL TARTRATE 25 MG PO TABS: 37.5000 mg | ORAL_TABLET | Freq: Two times a day (BID) | ORAL | 3 refills | Status: DC

## 2023-03-04 NOTE — Telephone Encounter (Signed)
I called the patient to review symptoms and concerns.  He has concerns w his hr being higher than normal.  High 80s-90s.  Reviewed his medications.   He said he had a steroid shot for his allergies about 3 weeks ago.  Otherwise nothing different.  That is the only difference he notes.  His CP/tightness is as it was when he was in to see the doctor the last 2 visits.     He wants to know if Dr. Clifton James thinks he could increase metoprolol in the morning to see if helps/wear a monitor.

## 2023-03-04 NOTE — Telephone Encounter (Signed)
[name], MD  Sean Ka, RN He can take an extra 25 mg of metoprolol in the morning and follow his heart rate during the day. Even taking a total of 50 mg po BID would be ok with me. Sean Schultz the patient and reviewed recommendation from Dr. Clifton James.  Discussed with the patient and he has decided to increase metoprolol by 1/2 tablet twice a day, ==37.5 mg BID.  He is going to monitor BP and HR over the next 1-2 weeks and will send in his readings.  Reviewed to check 3-4 hours after taking medications, resting a few min w feet flat on the floor beforehand.

## 2023-03-05 ENCOUNTER — Telehealth: Payer: Self-pay | Admitting: Cardiovascular Disease

## 2023-03-05 ENCOUNTER — Other Ambulatory Visit (HOSPITAL_COMMUNITY): Payer: Self-pay

## 2023-03-05 NOTE — Telephone Encounter (Signed)
Patient called the office and stated that on March 12,24, Dr, Clifton James  increased Irbesartan 150 mg twice a day. He stated that [facility] told him that Medicare Part D will not cover medication twice a day. Patient stated that he has 5 to 7 days of medication left. Patient would like to know what can be done. Patient may be reached at [phone]. Thank you

## 2023-03-05 NOTE — Telephone Encounter (Signed)
Pt c/o medication issue:  1. Name of Medication: irbesartan (AVAPRO) 150 MG tablet   2. How are you currently taking this medication (dosage and times per day)?   3. Are you having a reaction (difficulty breathing--STAT)?   4. What is your medication issue? Patient's pharmacy is calling to get PA for this medication.

## 2023-03-06 MED ORDER — IRBESARTAN 300 MG PO TABS: 300.0000 mg | ORAL_TABLET | Freq: Every day | ORAL | 0 refills | Status: DC

## 2023-03-06 NOTE — Telephone Encounter (Signed)
Spoke with patient and shared Dr. Gibson Ramp response:  This can be changed to 300 mg once daily and taken once daily. Sean Schultz   He believes his insurance is refusing to fill his irbesartan  BID because their system thinks it is too early to refill. Patient is running low on this med due to recent increase.  Rx for irbesartan  QD sent to [facility]. Patient requests 30 day supply be sent in.   Patient verbalized understanding and expressed appreciation. He will call around 04/02/23 for refill.

## 2023-04-01 ENCOUNTER — Telehealth: Payer: Self-pay | Admitting: Cardiovascular Disease

## 2023-04-01 ENCOUNTER — Other Ambulatory Visit: Payer: Self-pay | Admitting: Cardiovascular Disease

## 2023-04-01 MED ORDER — IRBESARTAN 150 MG PO TABS: 150.0000 mg | ORAL_TABLET | Freq: Two times a day (BID) | ORAL | 3 refills | Status: DC

## 2023-04-01 NOTE — Telephone Encounter (Signed)
Called the patient and cofirmed he was splitting the 300 mg tablet in half and taking 150 mg BID.  It needed filled that way for short term supply and he was cutting in half.  I have resent the medication as 150 mg BID.  He said the pharmacist is aware and expecting a new prescription.

## 2023-04-01 NOTE — Telephone Encounter (Signed)
Replied to patient's mychart message for this same subject.

## 2023-04-01 NOTE — Telephone Encounter (Signed)
Pt c/o medication issue:  1. Name of Medication:   Irbesartan 150mg  tablets     2. How are you currently taking this medication (dosage and times per day)? As written   3. Are you having a reaction (difficulty breathing--STAT)? No   4. What is your medication issue? Pt states he was supposed to have a script written for 150mg  tablets instead of 300mg  tablets. Please advise.

## 2023-04-01 NOTE — Telephone Encounter (Signed)
Pt c/o medication issue:  1. Name of Medication: Irbesartan 150 mg  2. How are you currently taking this medication (dosage and times per day)?      you having a reaction (difficulty breathing--STAT)?   4. What is your medication issue? Please have Michalen to call patient- he needs to discuss this medicine with her

## 2023-04-03 MED ORDER — IRBESARTAN 300 MG PO TABS: 150.0000 mg | ORAL_TABLET | Freq: Two times a day (BID) | ORAL | 3 refills | Status: DC

## 2023-04-03 NOTE — Addendum Note (Signed)
Addended by: Lendon Ka on: 04/03/2023 01:09 PM   Modules accepted: Orders

## 2023-04-03 NOTE — Telephone Encounter (Signed)
Per patient I have updated medication list per his message.  Insurance does not cover 150 mg BID.  Sent prescrption for 300 mg - 1/2 tablet BID.

## 2023-04-04 ENCOUNTER — Other Ambulatory Visit (HOSPITAL_COMMUNITY): Payer: Self-pay

## 2023-06-21 ENCOUNTER — Telehealth: Payer: Self-pay | Admitting: Cardiovascular Disease

## 2023-06-21 MED ORDER — METOPROLOL TARTRATE 25 MG PO TABS: 37.5000 mg | ORAL_TABLET | Freq: Two times a day (BID) | ORAL | 2 refills | Status: DC

## 2023-06-21 NOTE — Telephone Encounter (Signed)
Pt's medication was sent to pt's pharmacy as requested. Confirmation received.  °

## 2023-06-21 NOTE — Telephone Encounter (Signed)
*  STAT* If patient is at the pharmacy, call can be transferred to refill team.   1. Which medications need to be refilled? (please list name of each medication and dose if known) metoprolol tartrate (LOPRESSOR) 25 MG tablet   Take 1.5 tablets (37.5 mg total) by mouth 2 (two) times daily.    2. Which pharmacy/location (including street and city if local pharmacy) is medication to be sent to? [facility] [id number] - [place], [place] - [address]   3. Do they need a 30 day or 90 day supply? 90 Day Supply

## 2023-06-27 ENCOUNTER — Telehealth: Payer: Self-pay | Admitting: Cardiovascular Disease

## 2023-06-27 NOTE — Telephone Encounter (Signed)
Pt noting BPs running 140/70s each morning at 5am when he checks.  This is prior to am meds.    Initially after most recent increase it was 110s-120s around 5am and now he is seeing the increase.  He wants to make sure that everything is okay.   Will continue to watch readings and if starts getting readings 140 or greater consistently, he will let us know.  Has appointment in October.  He is aware I will let Dr. Clifton James know and we will call him w any new recommendations.

## 2023-06-27 NOTE — Telephone Encounter (Signed)
Pt c/o BP issue: STAT if pt c/o blurred vision, one-sided weakness or slurred speech  1. What are your last 5 BP readings?   Yesterday  140/79 HR 63 Today at 9:00 am - 118/64  HR 62                5:00 am - 135/79  HR 64  2. Are you having any other symptoms (ex. Dizziness, headache, blurred vision, passed out)?   No  3. What is your BP issue?   Patient stated recently his BP has been trending high and his HR has been normal in the morning and in the evening his BP is normal and his HR has been higher.  Patient noted his losartan and metoprolol was recently increased.

## 2023-07-30 ENCOUNTER — Other Ambulatory Visit: Payer: Self-pay | Admitting: Cardiovascular Disease

## 2023-08-26 ENCOUNTER — Ambulatory Visit: Payer: Medicare Other | Admitting: Physician Assistant

## 2023-08-28 ENCOUNTER — Ambulatory Visit: Payer: Medicare Other | Admitting: Cardiovascular Disease

## 2023-09-02 ENCOUNTER — Ambulatory Visit: Payer: Medicare Other | Attending: Cardiovascular Disease | Admitting: Cardiovascular Disease

## 2023-09-02 ENCOUNTER — Encounter: Payer: Self-pay | Admitting: Cardiovascular Disease

## 2023-09-02 VITALS — BP 102/78 | HR 58 | Ht 67.0 in | Wt 166.0 lb

## 2023-09-02 DIAGNOSIS — I10 Essential (primary) hypertension: Secondary | ICD-10-CM | POA: Diagnosis not present

## 2023-09-02 DIAGNOSIS — E7800 Pure hypercholesterolemia, unspecified: Secondary | ICD-10-CM | POA: Insufficient documentation

## 2023-09-02 DIAGNOSIS — I25118 Atherosclerotic heart disease of native coronary artery with other forms of angina pectoris: Secondary | ICD-10-CM | POA: Diagnosis present

## 2023-09-02 MED ORDER — RANOLAZINE ER 500 MG PO TB12: 500.0000 mg | ORAL_TABLET | Freq: Two times a day (BID) | ORAL | 3 refills | Status: DC

## 2023-09-02 NOTE — Progress Notes (Signed)
Chief Complaint  Patient presents with   Follow-up    CAD   History of Present Illness: 72 yo male with history of CAD, HTN, hyperlipidemia and GERD who is here today for cardiac follow up. Cardiac cath November 2017 with a calcified 99% mid RCA stenosis treated with [facility] orbital atherectomy of the RCA followed by placement of a drug eluting stent in the mid RCA. Mild disease in the LAD. Echo in July 2018 with normal LV systolic function and no valve disease. I saw him on 01/22/23 and he c/o mild chest pressure with exertion. Nuclear stress test 01/30/23 with no ischemia.   He is here today for follow up. The patient denies any chest pain, dyspnea, palpitations, lower extremity edema, orthopnea, PND, dizziness, near syncope or syncope. His blood pressure    Primary Care Physician: Alfonso Patten, MD  Past Medical History:  Diagnosis Date   Arthritis    Asthma    seasonal asthma as a child   BPH (benign prostatic hypertrophy)    Coronary artery disease    stent 2017   GERD (gastroesophageal reflux disease)    H/O seasonal allergies    Hyperlipemia    Hypertension    Prostatitis     Past Surgical History:  Procedure Laterality Date   APPENDECTOMY     CARDIAC CATHETERIZATION N/A 09/21/2016   Procedure: Left Heart Cath and Coronary Angiography;  Surgeon: [name], MD;  Location: [facility] INVASIVE CV LAB;  Service: Cardiovascular;  Laterality: N/A;   CARDIAC CATHETERIZATION N/A 09/24/2016   Procedure: Coronary Stent Intervention;  Surgeon: [name], MD;  Location: [facility] INVASIVE CV LAB;  Service: Cardiovascular;  Laterality: N/A;   CARDIAC CATHETERIZATION N/A 09/24/2016   Procedure: Coronary/Graft Atherectomy;  Surgeon: [name], MD;  Location: [facility] INVASIVE CV LAB;  Service: Cardiovascular;  Laterality: N/A;   CARDIAC CATHETERIZATION N/A 09/24/2016   Procedure: Temporary Pacemaker;  Surgeon: [name], MD;  Location: [facility]  INVASIVE CV LAB;  Service: Cardiovascular;  Laterality: N/A;   CARDIOVASCULAR STRESS TEST  09/14/2016   COLONOSCOPY     CORONARY STENT PLACEMENT     JOINT REPLACEMENT     left hip dr. Charlann Boxer 08-05-18   LAPAROSCOPIC APPENDECTOMY N/A 07/14/2016   Procedure: APPENDECTOMY LAPAROSCOPIC;  Surgeon: Ovidio Kin, MD;  Location: [facility] ORS;  Service: General;  Laterality: N/A;   TOTAL HIP ARTHROPLASTY     right 2012   TOTAL HIP ARTHROPLASTY Left 08/05/2018   Procedure: LEFT TOTAL HIP ARTHROPLASTY ANTERIOR APPROACH;  Surgeon: Durene Romans, MD;  Location: [facility] ORS;  Service: Orthopedics;  Laterality: Left;  70 mins   WISDOM TOOTH EXTRACTION      Current Outpatient Medications  Medication Sig Dispense Refill   ASPIRIN 81 PO Take 81 mg by mouth daily.     aspirin EC 81 MG tablet Take 81 mg by mouth every evening.      calcium carbonate (TUMS - DOSED IN MG ELEMENTAL CALCIUM) 500 MG chewable tablet Chew 1 tablet by mouth daily as needed for heartburn.      cetirizine (ZYRTEC) 10 MG tablet Take 10 mg by mouth daily as needed for allergies.     cholecalciferol (VITAMIN D3) 25 MCG (1000 UT) tablet Take 1,000 Units by mouth daily.     Coenzyme Q10 (CO Q-10) 100 MG CAPS Take 100 mg by mouth daily.     Coenzyme Q10 (COQ-10) 100 MG CAPS Take 100 mg by mouth 2 (two) times daily.  Cyanocobalamin 1000 MCG TBCR Take 1,000 mcg by mouth daily.      diphenhydrAMINE (BENADRYL) 25 MG tablet Take 25 mg by mouth daily as needed for allergies.     fluticasone (FLONASE) 50 MCG/ACT nasal spray Place 1 spray into both nostrils daily as needed for allergies or rhinitis.     folic acid (FOLVITE) 400 MCG tablet Take 400 mcg by mouth daily.     irbesartan (AVAPRO) 300 MG tablet Take 0.5 tablets (150 mg total) by mouth 2 (two) times daily. 90 tablet 3   Ketotifen Fumarate (ALAWAY OP) Place 1 drop into both eyes daily as needed (allergies).     Krill Oil 500 MG CAPS Take 500 mg by mouth daily.     Melatonin 3 MG TABS Take 3 mg by mouth  at bedtime as needed (sleep).     metoprolol tartrate (LOPRESSOR) 25 MG tablet Take 1.5 tablets (37.5 mg total) by mouth 2 (two) times daily. 270 tablet 2   montelukast (SINGULAIR) 10 MG tablet Take 10 mg by mouth daily as needed (allergies).   1   niacin (SLO-NIACIN) 500 MG tablet Take 500 mg by mouth daily.     niacin (VITAMIN B3) 500 MG tablet Take 500 mg by mouth daily.     nitroGLYCERIN (NITROSTAT) 0.4 MG SL tablet DISSOLVE 1 TAB UNDER TONGUE FOR CHEST PAIN - IF PAIN REMAINS AFTER 5 MIN, CALL 911 AND REPEAT DOSE. MAX 3 TABS IN 15 MINUTES 25 tablet 5   psyllium (REGULOID) 0.52 g capsule Take 0.52 g by mouth daily.     ranolazine (RANEXA) 500 MG 12 hr tablet Take 1 tablet (500 mg total) by mouth 2 (two) times daily. 180 tablet 3   rosuvastatin (CRESTOR) 5 MG tablet TAKE 1 TABLET BY MOUTH 5 TIMES PER WEEK 60 tablet 3   No current facility-administered medications for this visit.    Allergies  Allergen Reactions   Penicillins Swelling and Other (See Comments)    Dyspnea, hair loss-childhood allergy Has patient had a PCN reaction causing immediate rash, facial/tongue/throat swelling, SOB or lightheadedness with hypotension:unsure Has patient had a PCN reaction causing severe rash involving mucus membranes or skin necrosis:unsure Has patient had a PCN reaction that required hospitalization:unsure Has patient had a PCN reaction occurring within the last 10 years:No If all of the above answers are "NO", then may proceed with Cephalosporin use.     Atorvastatin Calcium Other (See Comments)    LFT increased LFT increased   Rosuvastatin     In high doses caused levated LFT Joint muscle pain      Social History   Socioeconomic History   Marital status: Married    Spouse name: Not on file   Number of children: 2   Years of education: Not on file   Highest education level: Not on file  Occupational History   Occupation: sales  Tobacco Use   Smoking status: Former    Current  packs/day: 0.00    Average packs/day: 0.5 packs/day for 12.0 years (6.0 ttl pk-yrs)    Types: Cigarettes    Start date: 11/17/1966    Quit date: 11/17/1978    Years since quitting: 44.8   Smokeless tobacco: Never  Vaping Use   Vaping status: Never Used  Substance and Sexual Activity   Alcohol use: Yes    Alcohol/week: 1.0 standard drink of alcohol    Types: 1 Glasses of wine per week    Comment: rare   Drug use: No  Sexual activity: Yes  Other Topics Concern   Not on file  Social History Narrative   Not on file   Social Determinants of Health   Financial Resource Strain: Not on file  Food Insecurity: Not on file  Transportation Needs: Not on file  Physical Activity: Not on file  Stress: Not on file  Social Connections: Unknown (02/10/2023)   [facility]   Social Network    Social Network: Not on file  Intimate Partner Violence: Not At Risk (02/10/2023)   Received from [facility]   HITS    Over the last 12 months how often did your partner physically hurt you?: 1    Over the last 12 months how often did your partner insult you or talk down to you?: 1    Over the last 12 months how often did your partner threaten you with physical harm?: 1    Over the last 12 months how often did your partner scream or curse at you?: 1    Family History  Problem Relation Age of Onset   Coronary artery disease Mother 82   CVA Mother    Heart attack Father 26   Microcephaly Paternal Uncle    Heart attack Paternal Uncle     Review of Systems:  As stated in the HPI and otherwise negative.   BP 102/78   Pulse (!) 58   Ht 5\' 7"  (1.702 m)   Wt 75.3 kg   SpO2 98%   BMI 26.00 kg/m   Physical Examination: General: Well developed, well nourished, NAD  HEENT: OP clear, mucus membranes moist  SKIN: warm, dry. No rashes. Neuro: No focal deficits  Musculoskeletal: Muscle strength 5/5 all ext  Psychiatric: Mood and affect normal  Neck: No  JVD, no carotid bruits, no thyromegaly, no lymphadenopathy.  Lungs:Clear bilaterally, no wheezes, rhonci, crackles Cardiovascular: Regular rate and rhythm. No murmurs, gallops or rubs. Abdomen:Soft. Bowel sounds present. Non-tender.  Extremities: No lower extremity edema. Pulses are 2 + in the bilateral DP/PT.  EKG:  EKG is not ordered today. The ekg ordered today demonstrates   Recent Labs: 01/22/2023: BUN 10; Creatinine, Ser 0.92; Potassium 4.9; Sodium 140   Lipid Pane Lipid Panel     Component Value Date/Time   CHOL 143 09/14/2021 0731   TRIG 129 09/14/2021 0731   HDL 45 09/14/2021 0731   CHOLHDL 3.2 09/14/2021 0731   LDLCALC 75 09/14/2021 0731   LABVLDL 23 09/14/2021 0731      Wt Readings from Last 3 Encounters:  09/02/23 75.3 kg  02/25/23 72.6 kg  01/30/23 74.4 kg    Assessment and Plan:   1. CAD with angina: No ischemia on stress test 01/30/23. He continues to have atypical chest pain every day. His chest wall aches and it worsens with movement and touching his chest wall. No associated dyspnea. Will add Renaexa 500 mg po BID. Continue ASA, beta blocker and statin. If he continues to have chest pain over the next month, will see him back and arrange a cardiac cath.   2. Hyperlipidemia: Lipids followed in primary care. LDL near goal in May 2024. Continue statin  3. HTN: BP is controlled. Continue current therapy  Disposition:   F/U with me in six months  Signed, Verne Carrow, MD 09/02/2023 11:15 AM    [facility] [address] [place], [place], [place]  [address] Phone: [phone]; Fax: [phone]

## 2023-09-02 NOTE — Patient Instructions (Signed)
Medication Instructions:  Your physician has recommended you make the following change in your medication:  1.) start ranolazine (Ranexa) 500 mg - one tablet twice a day  *If you need a refill on your cardiac medications before your next appointment, please call your pharmacy*   Lab Work: none   Testing/Procedures: none   Follow-Up: At [facility] you and your health needs are our priority.  As part of our continuing mission to provide you with exceptional heart care, we have created designated Provider Care Teams.  These Care Teams include your primary Cardiologist (physician) and Advanced Practice Providers (APPs -  Physician Assistants and Nurse Practitioners) who all work together to provide you with the care you need, when you need it.   Your next appointment:   4 week(s)  Provider:   Verne Carrow, MD

## 2023-09-02 NOTE — H&P (View-Only) (Signed)
Chief Complaint  Patient presents with   Follow-up    CAD   History of Present Illness: 72 yo male with history of CAD, HTN, hyperlipidemia and GERD who is here today for cardiac follow up. Cardiac cath November 2017 with a calcified 99% mid RCA stenosis treated with [facility] orbital atherectomy of the RCA followed by placement of a drug eluting stent in the mid RCA. Mild disease in the LAD. Echo in July 2018 with normal LV systolic function and no valve disease. I saw him on 01/22/23 and he c/o mild chest pressure with exertion. Nuclear stress test 01/30/23 with no ischemia.   He is here today for follow up. The patient denies any chest pain, dyspnea, palpitations, lower extremity edema, orthopnea, PND, dizziness, near syncope or syncope. His blood pressure    Primary Care Physician: Alfonso Patten, MD  Past Medical History:  Diagnosis Date   Arthritis    Asthma    seasonal asthma as a child   BPH (benign prostatic hypertrophy)    Coronary artery disease    stent 2017   GERD (gastroesophageal reflux disease)    H/O seasonal allergies    Hyperlipemia    Hypertension    Prostatitis     Past Surgical History:  Procedure Laterality Date   APPENDECTOMY     CARDIAC CATHETERIZATION N/A 09/21/2016   Procedure: Left Heart Cath and Coronary Angiography;  Surgeon: [name], MD;  Location: [facility] INVASIVE CV LAB;  Service: Cardiovascular;  Laterality: N/A;   CARDIAC CATHETERIZATION N/A 09/24/2016   Procedure: Coronary Stent Intervention;  Surgeon: [name], MD;  Location: [facility] INVASIVE CV LAB;  Service: Cardiovascular;  Laterality: N/A;   CARDIAC CATHETERIZATION N/A 09/24/2016   Procedure: Coronary/Graft Atherectomy;  Surgeon: [name], MD;  Location: [facility] INVASIVE CV LAB;  Service: Cardiovascular;  Laterality: N/A;   CARDIAC CATHETERIZATION N/A 09/24/2016   Procedure: Temporary Pacemaker;  Surgeon: [name], MD;  Location: [facility]  INVASIVE CV LAB;  Service: Cardiovascular;  Laterality: N/A;   CARDIOVASCULAR STRESS TEST  09/14/2016   COLONOSCOPY     CORONARY STENT PLACEMENT     JOINT REPLACEMENT     left hip dr. Charlann Boxer 08-05-18   LAPAROSCOPIC APPENDECTOMY N/A 07/14/2016   Procedure: APPENDECTOMY LAPAROSCOPIC;  Surgeon: Ovidio Kin, MD;  Location: [facility] ORS;  Service: General;  Laterality: N/A;   TOTAL HIP ARTHROPLASTY     right 2012   TOTAL HIP ARTHROPLASTY Left 08/05/2018   Procedure: LEFT TOTAL HIP ARTHROPLASTY ANTERIOR APPROACH;  Surgeon: Durene Romans, MD;  Location: [facility] ORS;  Service: Orthopedics;  Laterality: Left;  70 mins   WISDOM TOOTH EXTRACTION      Current Outpatient Medications  Medication Sig Dispense Refill   ASPIRIN 81 PO Take 81 mg by mouth daily.     aspirin EC 81 MG tablet Take 81 mg by mouth every evening.      calcium carbonate (TUMS - DOSED IN MG ELEMENTAL CALCIUM) 500 MG chewable tablet Chew 1 tablet by mouth daily as needed for heartburn.      cetirizine (ZYRTEC) 10 MG tablet Take 10 mg by mouth daily as needed for allergies.     cholecalciferol (VITAMIN D3) 25 MCG (1000 UT) tablet Take 1,000 Units by mouth daily.     Coenzyme Q10 (CO Q-10) 100 MG CAPS Take 100 mg by mouth daily.     Coenzyme Q10 (COQ-10) 100 MG CAPS Take 100 mg by mouth 2 (two) times daily.  Cyanocobalamin 1000 MCG TBCR Take 1,000 mcg by mouth daily.      diphenhydrAMINE (BENADRYL) 25 MG tablet Take 25 mg by mouth daily as needed for allergies.     fluticasone (FLONASE) 50 MCG/ACT nasal spray Place 1 spray into both nostrils daily as needed for allergies or rhinitis.     folic acid (FOLVITE) 400 MCG tablet Take 400 mcg by mouth daily.     irbesartan (AVAPRO) 300 MG tablet Take 0.5 tablets (150 mg total) by mouth 2 (two) times daily. 90 tablet 3   Ketotifen Fumarate (ALAWAY OP) Place 1 drop into both eyes daily as needed (allergies).     Krill Oil 500 MG CAPS Take 500 mg by mouth daily.     Melatonin 3 MG TABS Take 3 mg by mouth  at bedtime as needed (sleep).     metoprolol tartrate (LOPRESSOR) 25 MG tablet Take 1.5 tablets (37.5 mg total) by mouth 2 (two) times daily. 270 tablet 2   montelukast (SINGULAIR) 10 MG tablet Take 10 mg by mouth daily as needed (allergies).   1   niacin (SLO-NIACIN) 500 MG tablet Take 500 mg by mouth daily.     niacin (VITAMIN B3) 500 MG tablet Take 500 mg by mouth daily.     nitroGLYCERIN (NITROSTAT) 0.4 MG SL tablet DISSOLVE 1 TAB UNDER TONGUE FOR CHEST PAIN - IF PAIN REMAINS AFTER 5 MIN, CALL 911 AND REPEAT DOSE. MAX 3 TABS IN 15 MINUTES 25 tablet 5   psyllium (REGULOID) 0.52 g capsule Take 0.52 g by mouth daily.     ranolazine (RANEXA) 500 MG 12 hr tablet Take 1 tablet (500 mg total) by mouth 2 (two) times daily. 180 tablet 3   rosuvastatin (CRESTOR) 5 MG tablet TAKE 1 TABLET BY MOUTH 5 TIMES PER WEEK 60 tablet 3   No current facility-administered medications for this visit.    Allergies  Allergen Reactions   Penicillins Swelling and Other (See Comments)    Dyspnea, hair loss-childhood allergy Has patient had a PCN reaction causing immediate rash, facial/tongue/throat swelling, SOB or lightheadedness with hypotension:unsure Has patient had a PCN reaction causing severe rash involving mucus membranes or skin necrosis:unsure Has patient had a PCN reaction that required hospitalization:unsure Has patient had a PCN reaction occurring within the last 10 years:No If all of the above answers are "NO", then may proceed with Cephalosporin use.     Atorvastatin Calcium Other (See Comments)    LFT increased LFT increased   Rosuvastatin     In high doses caused levated LFT Joint muscle pain      Social History   Socioeconomic History   Marital status: Married    Spouse name: Not on file   Number of children: 2   Years of education: Not on file   Highest education level: Not on file  Occupational History   Occupation: sales  Tobacco Use   Smoking status: Former    Current  packs/day: 0.00    Average packs/day: 0.5 packs/day for 12.0 years (6.0 ttl pk-yrs)    Types: Cigarettes    Start date: 11/17/1966    Quit date: 11/17/1978    Years since quitting: 44.8   Smokeless tobacco: Never  Vaping Use   Vaping status: Never Used  Substance and Sexual Activity   Alcohol use: Yes    Alcohol/week: 1.0 standard drink of alcohol    Types: 1 Glasses of wine per week    Comment: rare   Drug use: No  Sexual activity: Yes  Other Topics Concern   Not on file  Social History Narrative   Not on file   Social Determinants of Health   Financial Resource Strain: Not on file  Food Insecurity: Not on file  Transportation Needs: Not on file  Physical Activity: Not on file  Stress: Not on file  Social Connections: Unknown (02/10/2023)   [facility]   Social Network    Social Network: Not on file  Intimate Partner Violence: Not At Risk (02/10/2023)   Received from [facility]   HITS    Over the last 12 months how often did your partner physically hurt you?: 1    Over the last 12 months how often did your partner insult you or talk down to you?: 1    Over the last 12 months how often did your partner threaten you with physical harm?: 1    Over the last 12 months how often did your partner scream or curse at you?: 1    Family History  Problem Relation Age of Onset   Coronary artery disease Mother 82   CVA Mother    Heart attack Father 26   Microcephaly Paternal Uncle    Heart attack Paternal Uncle     Review of Systems:  As stated in the HPI and otherwise negative.   BP 102/78   Pulse (!) 58   Ht 5\' 7"  (1.702 m)   Wt 75.3 kg   SpO2 98%   BMI 26.00 kg/m   Physical Examination: General: Well developed, well nourished, NAD  HEENT: OP clear, mucus membranes moist  SKIN: warm, dry. No rashes. Neuro: No focal deficits  Musculoskeletal: Muscle strength 5/5 all ext  Psychiatric: Mood and affect normal  Neck: No  JVD, no carotid bruits, no thyromegaly, no lymphadenopathy.  Lungs:Clear bilaterally, no wheezes, rhonci, crackles Cardiovascular: Regular rate and rhythm. No murmurs, gallops or rubs. Abdomen:Soft. Bowel sounds present. Non-tender.  Extremities: No lower extremity edema. Pulses are 2 + in the bilateral DP/PT.  EKG:  EKG is not ordered today. The ekg ordered today demonstrates   Recent Labs: 01/22/2023: BUN 10; Creatinine, Ser 0.92; Potassium 4.9; Sodium 140   Lipid Pane Lipid Panel     Component Value Date/Time   CHOL 143 09/14/2021 0731   TRIG 129 09/14/2021 0731   HDL 45 09/14/2021 0731   CHOLHDL 3.2 09/14/2021 0731   LDLCALC 75 09/14/2021 0731   LABVLDL 23 09/14/2021 0731      Wt Readings from Last 3 Encounters:  09/02/23 75.3 kg  02/25/23 72.6 kg  01/30/23 74.4 kg    Assessment and Plan:   1. CAD with angina: No ischemia on stress test 01/30/23. He continues to have atypical chest pain every day. His chest wall aches and it worsens with movement and touching his chest wall. No associated dyspnea. Will add Renaexa 500 mg po BID. Continue ASA, beta blocker and statin. If he continues to have chest pain over the next month, will see him back and arrange a cardiac cath.   2. Hyperlipidemia: Lipids followed in primary care. LDL near goal in May 2024. Continue statin  3. HTN: BP is controlled. Continue current therapy  Disposition:   F/U with me in six months  Signed, Verne Carrow, MD 09/02/2023 11:15 AM    [facility] [address] [place], [place], [place]  [address] Phone: [phone]; Fax: [phone]

## 2023-09-10 ENCOUNTER — Telehealth: Payer: Self-pay | Admitting: Cardiovascular Disease

## 2023-09-10 DIAGNOSIS — Z01812 Encounter for preprocedural laboratory examination: Secondary | ICD-10-CM

## 2023-09-10 DIAGNOSIS — I2510 Atherosclerotic heart disease of native coronary artery without angina pectoris: Secondary | ICD-10-CM

## 2023-09-10 NOTE — Telephone Encounter (Signed)
Pt c/o BP issue: STAT if pt c/o blurred vision, one-sided weakness or slurred speech  1. What are your last 5 BP readings? 140/87  2. Are you having any other symptoms (ex. Dizziness, headache, blurred vision, passed out)? No  3. What is your BP issue? Pt is requesting a callback to see if he should be seen sooner. Please advise

## 2023-09-10 NOTE — Telephone Encounter (Signed)
Pt calling back after line disconnected. Requesting cb

## 2023-09-11 NOTE — Telephone Encounter (Signed)
I talked w Sean Schultz.  He had only one reading of slightly elevated BP a couple days ago 140/87.  On his recheck it was 126/73 an hour later.  All other bps have been high 90s-100s over 50s-60s.    His concerns are that the Ranexa has not help his chest pains at all and now he is not sleeping well and fatigued during the day.  I asked him to stop the Ranexa which he is in agreement with. The chest/chest wall pains are occurring while he is doing a quick walk on the track at the [facility] or when he goes up the steps in his home.  Not noticing at rest.  He'd like to talk with Dr. Clifton James about the possible need for a heart cath. He wants a sooner appointment than 10/09/23 but does not wish to see an APP.  They discussed this possibility at the last ov on 09/02/23 so he may not need to be seen again so soon.    I told him we'd let Dr. Clifton James know this update and will call him back with recommendations.  Pt in agreement.

## 2023-09-12 NOTE — Telephone Encounter (Signed)
Patient is following up requesting to speak with Nani Skillern, RN, again regarding this matter. He mentions that he is open to seeing an APP if he must.

## 2023-09-12 NOTE — Telephone Encounter (Signed)
Spoke w the patient.  He wanted to know if I'd heard back from Dr. Clifton James.  No changes in s/s from yesterday.  Thinking this is combination of right chest wall and left chest pain.  His BP this am was 116/63.  He is off Ranexa.   Since no changes in s/s he will wait to hear back from MD on recommendations.  Def prefers Dr. Clifton James do cath if needed.  Aware I may not hear back before Monday.  [place]  Will call EMS if needed.  He is aware if needed he will go to the ER.

## 2023-09-16 NOTE — Telephone Encounter (Signed)
 Pt returning call, requesting cb

## 2023-09-16 NOTE — Telephone Encounter (Signed)
I spoke w patient.  Scheduled left heart cath for 09/24/23 with Dr. Clifton James.  He will come in on Monday for labs and EKG.  Is out of town until then.   Instructions through the patient portal as discussed.    Pt has follow up scheduled 10/09/23.

## 2023-09-16 NOTE — Telephone Encounter (Signed)
Called patient.  He is unable to talk and asked me to call back.

## 2023-09-23 ENCOUNTER — Ambulatory Visit: Payer: Medicare Other | Attending: Cardiology

## 2023-09-23 VITALS — BP 130/80 | HR 53 | Ht 67.0 in | Wt 166.8 lb

## 2023-09-23 DIAGNOSIS — I2510 Atherosclerotic heart disease of native coronary artery without angina pectoris: Secondary | ICD-10-CM | POA: Diagnosis not present

## 2023-09-23 DIAGNOSIS — Z01812 Encounter for preprocedural laboratory examination: Secondary | ICD-10-CM | POA: Insufficient documentation

## 2023-09-23 NOTE — Patient Instructions (Signed)
Medication Instructions:  Your physician recommends that you continue on your current medications as directed. Please refer to the Current Medication list given to you today.   *If you need a refill on your cardiac medications before your next appointment, please call your pharmacy*   Lab Work: Labs to be completed today as previously ordered.  If you have labs (blood work) drawn today and your tests are completely normal, you will receive your results only by: MyChart Message (if you have MyChart) OR A paper copy in the mail If you have any lab test that is abnormal or we need to change your treatment, we will call you to review the results.   Testing/Procedures: None ordered.    Follow-Up: At [facility], you and your health needs are our priority.  As part of our continuing mission to provide you with exceptional heart care, we have created designated Provider Care Teams.  These Care Teams include your primary Cardiologist (physician) and Advanced Practice Providers (APPs -  Physician Assistants and Nurse Practitioners) who all work together to provide you with the care you need, when you need it.  We recommend signing up for the patient portal called "MyChart".  Sign up information is provided on this After Visit Summary.  MyChart is used to connect with patients for Virtual Visits (Telemedicine).  Patients are able to view lab/test results, encounter notes, upcoming appointments, etc.  Non-urgent messages can be sent to your provider as well.   To learn more about what you can do with MyChart, go to [email or web address].    Your next appointment:   Follow up as determined.

## 2023-09-23 NOTE — Progress Notes (Unsigned)
   Nurse Visit   Date of Encounter: 09/23/2023 ID: Sean Berthold., DOB 08-20-1951, MRN [id number]  PCP:  Alfonso Patten, MD   [facility] Providers Cardiologist:  Verne Carrow, MD { Click to update primary MD,subspecialty MD or APP then REFRESH:1}     Visit Details   VS:  BP 130/80   Pulse (!) 53   Ht 5\' 7"  (1.702 m)   Wt 166 lb 12.8 oz (75.7 kg)   BMI 26.12 kg/m  , BMI Body mass index is 26.12 kg/m.  Wt Readings from Last 3 Encounters:  09/23/23 166 lb 12.8 oz (75.7 kg)  09/02/23 166 lb (75.3 kg)  02/25/23 160 lb (72.6 kg)     Reason for visit: EKG and labs prior to cath scheduled for 09/23/2023. Performed today: Vital signs, EKG and consulted with Dr Jimmey Ralph, DOD Changes (medications, testing, etc.) : None ordered.  Length of Visit: 25 minutes    Medications Adjustments/Labs and Tests Ordered: Orders Placed This Encounter  Procedures   EKG 12-Lead   No orders of the defined types were placed in this encounter.  Pt presents today for pre-procedural EKG and labs as he is scheduled for heart cath with Dr Clifton James tomorrow 11/12 at [facility].  Cath instructions have been sent to pt's MyChart again and reviewed.  Pt verbalizes understanding.  Dr Jimmey Ralph, DOD has reviewed EKG.  No further recommendations at this time.  Signed, Alois Cliche, RN  09/23/2023 11:44 AM

## 2023-09-24 ENCOUNTER — Encounter (HOSPITAL_COMMUNITY): Payer: Self-pay | Admitting: Cardiovascular Disease

## 2023-09-24 ENCOUNTER — Encounter (HOSPITAL_COMMUNITY)
Admission: RE | Disposition: A | Discharge status: Home / Self Care | Payer: Self-pay | Source: Home / Self Care | Attending: Cardiovascular Disease

## 2023-09-24 ENCOUNTER — Ambulatory Visit (HOSPITAL_COMMUNITY)
Admission: RE | Admit: 2023-09-24 | Discharge: 2023-09-24 | Disposition: A | Discharge status: Home / Self Care | Payer: Medicare Other | Attending: Cardiovascular Disease | Admitting: Cardiovascular Disease

## 2023-09-24 ENCOUNTER — Other Ambulatory Visit: Payer: Self-pay

## 2023-09-24 DIAGNOSIS — Z79899 Other long term (current) drug therapy: Secondary | ICD-10-CM | POA: Diagnosis not present

## 2023-09-24 DIAGNOSIS — E785 Hyperlipidemia, unspecified: Secondary | ICD-10-CM | POA: Insufficient documentation

## 2023-09-24 DIAGNOSIS — Z955 Presence of coronary angioplasty implant and graft: Secondary | ICD-10-CM | POA: Insufficient documentation

## 2023-09-24 DIAGNOSIS — K219 Gastro-esophageal reflux disease without esophagitis: Secondary | ICD-10-CM | POA: Diagnosis not present

## 2023-09-24 DIAGNOSIS — I10 Essential (primary) hypertension: Secondary | ICD-10-CM | POA: Diagnosis not present

## 2023-09-24 DIAGNOSIS — Z7982 Long term (current) use of aspirin: Secondary | ICD-10-CM | POA: Diagnosis not present

## 2023-09-24 DIAGNOSIS — I25110 Atherosclerotic heart disease of native coronary artery with unstable angina pectoris: Secondary | ICD-10-CM | POA: Insufficient documentation

## 2023-09-24 DIAGNOSIS — I2584 Coronary atherosclerosis due to calcified coronary lesion: Secondary | ICD-10-CM | POA: Diagnosis not present

## 2023-09-24 DIAGNOSIS — Z87891 Personal history of nicotine dependence: Secondary | ICD-10-CM | POA: Diagnosis not present

## 2023-09-24 DIAGNOSIS — I2510 Atherosclerotic heart disease of native coronary artery without angina pectoris: Secondary | ICD-10-CM | POA: Diagnosis not present

## 2023-09-24 HISTORY — PX: LEFT HEART CATH AND CORONARY ANGIOGRAPHY: CATH118249

## 2023-09-24 LAB — BASIC METABOLIC PANEL
BUN/Creatinine Ratio: 10 (ref 10–24)
BUN: 10 mg/dL (ref 8–27)
CO2: 25 mmol/L (ref 20–29)
Calcium: 9.4 mg/dL (ref 8.6–10.2)
Chloride: 103 mmol/L (ref 96–106)
Creatinine, Ser: 0.96 mg/dL (ref 0.76–1.27)
Glucose: 105 mg/dL — ABNORMAL HIGH (ref 70–99)
Potassium: 5 mmol/L (ref 3.5–5.2)
Sodium: 140 mmol/L (ref 134–144)
eGFR: 83 mL/min/{1.73_m2} (ref >59)

## 2023-09-24 LAB — CBC
Hematocrit: 39.7 % (ref 37.5–51.0)
Hemoglobin: 13.2 g/dL (ref 13.0–17.7)
MCH: 31.9 pg (ref 26.6–33.0)
MCHC: 33.2 g/dL (ref 31.5–35.7)
MCV: 96 fL (ref 79–97)
Platelets: 200 10*3/uL (ref 150–450)
RBC: 4.14 x10E6/uL (ref 4.14–5.80)
RDW: 12.6 % (ref 11.6–15.4)
WBC: 4.8 10*3/uL (ref 3.4–10.8)

## 2023-09-24 SURGERY — LEFT HEART CATH AND CORONARY ANGIOGRAPHY
Anesthesia: LOCAL

## 2023-09-24 MED ORDER — SODIUM CHLORIDE 0.9 % IV SOLN: 250.0000 mL | INTRAVENOUS | Status: DC | PRN

## 2023-09-24 MED ORDER — SODIUM CHLORIDE 0.9% FLUSH: 3.0000 mL | Freq: Two times a day (BID) | INTRAVENOUS | Status: DC

## 2023-09-24 MED ORDER — ASPIRIN 81 MG PO CHEW: 81.0000 mg | CHEWABLE_TABLET | ORAL | Status: DC

## 2023-09-24 MED ORDER — FENTANYL CITRATE (PF) 100 MCG/2ML IJ SOLN
INTRAMUSCULAR | Status: DC | PRN
  Administered 2023-09-24: 25 ug via INTRAVENOUS

## 2023-09-24 MED ORDER — MIDAZOLAM HCL 2 MG/2ML IJ SOLN
INTRAMUSCULAR | Status: AC
  Filled 2023-09-24: qty 2

## 2023-09-24 MED ORDER — SODIUM CHLORIDE 0.9% FLUSH: 3.0000 mL | INTRAVENOUS | Status: DC | PRN

## 2023-09-24 MED ORDER — SODIUM CHLORIDE 0.9 % IV SOLN: INTRAVENOUS | Status: AC

## 2023-09-24 MED ORDER — LABETALOL HCL 5 MG/ML IV SOLN: 10.0000 mg | INTRAVENOUS | Status: DC | PRN

## 2023-09-24 MED ORDER — HEPARIN SODIUM (PORCINE) 1000 UNIT/ML IJ SOLN
INTRAMUSCULAR | Status: AC
  Filled 2023-09-24: qty 10

## 2023-09-24 MED ORDER — IOHEXOL 350 MG/ML SOLN
INTRAVENOUS | Status: DC | PRN
  Administered 2023-09-24: 50 mL

## 2023-09-24 MED ORDER — LIDOCAINE HCL (PF) 1 % IJ SOLN
INTRAMUSCULAR | Status: DC | PRN
  Administered 2023-09-24: 5 mL via INTRADERMAL

## 2023-09-24 MED ORDER — MIDAZOLAM HCL 2 MG/2ML IJ SOLN
INTRAMUSCULAR | Status: DC | PRN
  Administered 2023-09-24: 1 mg via INTRAVENOUS

## 2023-09-24 MED ORDER — VERAPAMIL HCL 2.5 MG/ML IV SOLN
INTRAVENOUS | Status: AC
  Filled 2023-09-24: qty 2

## 2023-09-24 MED ORDER — HEPARIN SODIUM (PORCINE) 1000 UNIT/ML IJ SOLN
INTRAMUSCULAR | Status: DC | PRN
  Administered 2023-09-24: 4000 [IU] via INTRAVENOUS

## 2023-09-24 MED ORDER — ACETAMINOPHEN 325 MG PO TABS
650.0000 mg | ORAL_TABLET | ORAL | Status: DC | PRN
Start: 2023-09-24 — End: 2023-09-24

## 2023-09-24 MED ORDER — HEPARIN (PORCINE) IN NACL 1000-0.9 UT/500ML-% IV SOLN
INTRAVENOUS | Status: DC | PRN
  Administered 2023-09-24 (×2): 500 mL

## 2023-09-24 MED ORDER — HYDRALAZINE HCL 20 MG/ML IJ SOLN: 10.0000 mg | INTRAMUSCULAR | Status: DC | PRN

## 2023-09-24 MED ORDER — FENTANYL CITRATE (PF) 100 MCG/2ML IJ SOLN
INTRAMUSCULAR | Status: AC
  Filled 2023-09-24: qty 2

## 2023-09-24 MED ORDER — LIDOCAINE HCL (PF) 1 % IJ SOLN
INTRAMUSCULAR | Status: AC
  Filled 2023-09-24: qty 30

## 2023-09-24 MED ORDER — VERAPAMIL HCL 2.5 MG/ML IV SOLN
INTRAVENOUS | Status: DC | PRN
  Administered 2023-09-24: 10 mL via INTRA_ARTERIAL

## 2023-09-24 MED ORDER — SODIUM CHLORIDE 0.9 % IV SOLN: INTRAVENOUS | Status: DC

## 2023-09-24 MED ORDER — ONDANSETRON HCL 4 MG/2ML IJ SOLN: 4.0000 mg | Freq: Four times a day (QID) | INTRAMUSCULAR | Status: DC | PRN

## 2023-09-24 SURGICAL SUPPLY — 8 items
CATH 5FR JL3.5 JR4 ANG PIG MP (CATHETERS) IMPLANT
DEVICE RAD COMP TR BAND LRG (VASCULAR PRODUCTS) IMPLANT
GLIDESHEATH SLEND SS 6F .021 (SHEATH) IMPLANT
GUIDEWIRE INQWIRE 1.5J.035X260 (WIRE) IMPLANT
INQWIRE 1.5J .035X260CM (WIRE) ×1
KIT SYRINGE INJ CVI SPIKEX1 (MISCELLANEOUS) IMPLANT
PACK CARDIAC CATHETERIZATION (CUSTOM PROCEDURE TRAY) ×1 IMPLANT
SET ATX-X65L (MISCELLANEOUS) IMPLANT

## 2023-09-24 NOTE — Discharge Instructions (Signed)
Radial Site Care  This sheet gives you information about how to care for yourself after your procedure. Your health care provider may also give you more specific instructions. If you have problems or questions, contact your health care provider. What can I expect after the procedure? After the procedure, it is common to have: Bruising and tenderness at the catheter insertion area. Follow these instructions at home: Medicines Take over-the-counter and prescription medicines only as told by your health care provider. Insertion site care Follow instructions from your health care provider about how to take care of your insertion site. Make sure you: Wash your hands with soap and water before you remove your bandage (dressing). If soap and water are not available, use hand sanitizer. May remove dressing in 24 hours. Check your insertion site every day for signs of infection. Check for: Redness, swelling, or pain. Fluid or blood. Pus or a bad smell. Warmth. Do no take baths, swim, or use a hot tub for 5 days. You may shower 24-48 hours after the procedure. Remove the dressing and gently wash the site with plain soap and water. Pat the area dry with a clean towel. Do not rub the site. That could cause bleeding. Do not apply powder or lotion to the site. Activity  For 24 hours after the procedure, or as directed by your health care provider: Do not flex or bend the affected arm. Do not push or pull heavy objects with the affected arm. Do not drive yourself home from the hospital or clinic. You may drive 24 hours after the procedure. Do not operate machinery or power tools. KEEP ARM ELEVATED THE REMAINDER OF THE DAY. Do not push, pull or lift anything that is heavier than 10 lb for 5 days. Ask your health care provider when it is okay to: Return to work or school. Resume usual physical activities or sports. Resume sexual activity. General instructions If the catheter site starts to  bleed, raise your arm and put firm pressure on the site. If the bleeding does not stop, get help right away. This is a medical emergency. DRINK PLENTY OF FLUIDS FOR THE NEXT 2-3 DAYS. No alcohol consumption for 24 hours after receiving sedation. If you went home on the same day as your procedure, a responsible adult should be with you for the first 24 hours after you arrive home. Keep all follow-up visits as told by your health care provider. This is important. Contact a health care provider if: You have a fever. You have redness, swelling, or yellow drainage around your insertion site. Get help right away if: You have unusual pain at the radial site. The catheter insertion area swells very fast. The insertion area is bleeding, and the bleeding does not stop when you hold steady pressure on the area. Your arm or hand becomes pale, cool, tingly, or numb. These symptoms may represent a serious problem that is an emergency. Do not wait to see if the symptoms will go away. Get medical help right away. Call your local emergency services (911 in the [place] Do not drive yourself to the hospital. Summary After the procedure, it is common to have bruising and tenderness at the site. Follow instructions from your health care provider about how to take care of your radial site wound. Check the wound every day for signs of infection.  This information is not intended to replace advice given to you by your health care provider. Make sure you discuss any questions you have with   your health care provider. Document Revised: 12/04/2017 Document Reviewed: 12/04/2017 Elsevier Patient Education  2020 [employer]  

## 2023-09-24 NOTE — Interval H&P Note (Signed)
History and Physical Interval Note:  09/24/2023 7:12 AM  Sean Schultz.  has presented today for surgery, with the diagnosis of cad - unstable angina.  The various methods of treatment have been discussed with the patient and family. After consideration of risks, benefits and other options for treatment, the patient has consented to  Procedure(s): LEFT HEART CATH AND CORONARY ANGIOGRAPHY (N/A) as a surgical intervention.  The patient's history has been reviewed, patient examined, no change in status, stable for surgery.  I have reviewed the patient's chart and labs.  Questions were answered to the patient's satisfaction.    Cath Lab Visit (complete for each Cath Lab visit)  Clinical Evaluation Leading to the Procedure:   ACS: No.  Non-ACS:    Anginal Classification: CCS III  Anti-ischemic medical therapy: Minimal Therapy (1 class of medications)  Non-Invasive Test Results: No non-invasive testing performed  Prior CABG: No previous CABG        Verne Carrow

## 2023-10-08 NOTE — Progress Notes (Unsigned)
No chief complaint on file.  History of Present Illness: 72 yo male with history of CAD, HTN, hyperlipidemia and GERD who is here today for cardiac follow up. Cardiac cath November 2017 with a calcified 99% mid RCA stenosis treated with [facility] orbital atherectomy of the RCA followed by placement of a drug eluting stent in the mid RCA. Mild disease in the LAD. Echo in July 2018 with normal LV systolic function and no valve disease. I saw him on 01/22/23 and he c/o mild chest pressure with exertion. Nuclear stress test 01/30/23 with no ischemia. I saw him in the office 09/02/23 and he c/o exertional chest pain. Ranexa was started but he reported increased fatigue. Cardiac cath 09/24/23 with moderate non-obstructive disease in the proximal and mid LAD, mild disease in the Circumflex and patent distal RCA stent. Normal LV filling pressures.   He is here today for follow up. The patient denies any chest pain, dyspnea, palpitations, lower extremity edema, orthopnea, PND, dizziness, near syncope or syncope.    Primary Care Physician: Alfonso Patten, MD  Past Medical History:  Diagnosis Date   Arthritis    Asthma    seasonal asthma as a child   BPH (benign prostatic hypertrophy)    Coronary artery disease    stent 2017   GERD (gastroesophageal reflux disease)    H/O seasonal allergies    Hyperlipemia    Hypertension    Prostatitis     Past Surgical History:  Procedure Laterality Date   APPENDECTOMY     CARDIAC CATHETERIZATION N/A 09/21/2016   Procedure: Left Heart Cath and Coronary Angiography;  Surgeon: [name], MD;  Location: [facility] INVASIVE CV LAB;  Service: Cardiovascular;  Laterality: N/A;   CARDIAC CATHETERIZATION N/A 09/24/2016   Procedure: Coronary Stent Intervention;  Surgeon: [name], MD;  Location: [facility] INVASIVE CV LAB;  Service: Cardiovascular;  Laterality: N/A;   CARDIAC CATHETERIZATION N/A 09/24/2016   Procedure: Coronary/Graft  Atherectomy;  Surgeon: [name], MD;  Location: [facility] INVASIVE CV LAB;  Service: Cardiovascular;  Laterality: N/A;   CARDIAC CATHETERIZATION N/A 09/24/2016   Procedure: Temporary Pacemaker;  Surgeon: [name], MD;  Location: [facility] INVASIVE CV LAB;  Service: Cardiovascular;  Laterality: N/A;   CARDIOVASCULAR STRESS TEST  09/14/2016   COLONOSCOPY     CORONARY STENT PLACEMENT     JOINT REPLACEMENT     left hip dr. Charlann Boxer 08-05-18   LAPAROSCOPIC APPENDECTOMY N/A 07/14/2016   Procedure: APPENDECTOMY LAPAROSCOPIC;  Surgeon: Ovidio Kin, MD;  Location: [facility] ORS;  Service: General;  Laterality: N/A;   LEFT HEART CATH AND CORONARY ANGIOGRAPHY N/A 09/24/2023   Procedure: LEFT HEART CATH AND CORONARY ANGIOGRAPHY;  Surgeon: [name], MD;  Location: [facility] INVASIVE CV LAB;  Service: Cardiovascular;  Laterality: N/A;   TOTAL HIP ARTHROPLASTY     right 2012   TOTAL HIP ARTHROPLASTY Left 08/05/2018   Procedure: LEFT TOTAL HIP ARTHROPLASTY ANTERIOR APPROACH;  Surgeon: Durene Romans, MD;  Location: [facility] ORS;  Service: Orthopedics;  Laterality: Left;  70 mins   WISDOM TOOTH EXTRACTION      Current Outpatient Medications  Medication Sig Dispense Refill   Ascorbic Acid (SUPER C COMPLEX PO) Take 1 tablet by mouth daily.     aspirin EC 81 MG tablet Take 81 mg by mouth every evening.      Calcium Polycarbophil (FIBERCON PO) Take 4 tablets by mouth daily.     cetirizine (ZYRTEC) 10 MG tablet Take 10  mg by mouth daily as needed for allergies.     Coenzyme Q10 (COQ-10 PO) Take 1 tablet by mouth daily.     Cyanocobalamin (B-12 PO) Take 1 capsule by mouth daily.     fluticasone (FLONASE) 50 MCG/ACT nasal spray Place 1 spray into both nostrils daily as needed for allergies or rhinitis.     irbesartan (AVAPRO) 300 MG tablet Take 0.5 tablets (150 mg total) by mouth 2 (two) times daily. 90 tablet 3   ketotifen (ZADITOR) 0.035 % ophthalmic solution Place 1 drop into both eyes 2 (two) times daily  as needed (allergies).     loratadine (CLARITIN) 10 MG tablet Take 10 mg by mouth daily as needed for allergies.     Melatonin 5 MG CAPS Take 5 mg by mouth at bedtime as needed (sleep).     metoprolol tartrate (LOPRESSOR) 25 MG tablet Take 1.5 tablets (37.5 mg total) by mouth 2 (two) times daily. 270 tablet 2   montelukast (SINGULAIR) 10 MG tablet Take 10 mg by mouth daily as needed (allergies).   1   NIACIN PO Take 1 tablet by mouth daily.     nitroGLYCERIN (NITROSTAT) 0.4 MG SL tablet DISSOLVE 1 TAB UNDER TONGUE FOR CHEST PAIN - IF PAIN REMAINS AFTER 5 MIN, CALL 911 AND REPEAT DOSE. MAX 3 TABS IN 15 MINUTES 25 tablet 5   Omega-3 Fatty Acids (FISH OIL PO) Take 1 capsule by mouth daily.     OVER THE COUNTER MEDICATION Take 2 tablets by mouth every evening. Cholesterol complete     rosuvastatin (CRESTOR) 5 MG tablet TAKE 1 TABLET BY MOUTH 5 TIMES PER WEEK (Patient taking differently: Take 5 mg by mouth every other day.) 60 tablet 3   No current facility-administered medications for this visit.    Allergies  Allergen Reactions   Penicillins Swelling and Other (See Comments)    Dyspnea, hair loss-childhood allergy   Atorvastatin Calcium Other (See Comments)    LFT increased    Rosuvastatin     In high doses caused levated LFT Joint muscle pain      Social History   Socioeconomic History   Marital status: Married    Spouse name: Not on file   Number of children: 2   Years of education: Not on file   Highest education level: Not on file  Occupational History   Occupation: sales  Tobacco Use   Smoking status: Former    Current packs/day: 0.00    Average packs/day: 0.5 packs/day for 12.0 years (6.0 ttl pk-yrs)    Types: Cigarettes    Start date: 11/17/1966    Quit date: 11/17/1978    Years since quitting: 44.9   Smokeless tobacco: Never  Vaping Use   Vaping status: Never Used  Substance and Sexual Activity   Alcohol use: Yes    Alcohol/week: 1.0 standard drink of alcohol     Types: 1 Glasses of wine per week    Comment: rare   Drug use: No   Sexual activity: Yes  Other Topics Concern   Not on file  Social History Narrative   Not on file   Social Determinants of Health   Financial Resource Strain: Not on file  Food Insecurity: Not on file  Transportation Needs: Not on file  Physical Activity: Not on file  Stress: Not on file  Social Connections: Unknown (02/10/2023)   Received from [facility]   Social Network    Social Network: Not on file  Intimate Partner Violence: Not At Risk (02/10/2023)   Received from [facility]   HITS    Over the last 12 months how often did your partner physically hurt you?: Never    Over the last 12 months how often did your partner insult you or talk down to you?: Never    Over the last 12 months how often did your partner threaten you with physical harm?: Never    Over the last 12 months how often did your partner scream or curse at you?: Never    Family History  Problem Relation Age of Onset   Coronary artery disease Mother 92   CVA Mother    Heart attack Father 40   Microcephaly Paternal Uncle    Heart attack Paternal Uncle     Review of Systems:  As stated in the HPI and otherwise negative.   There were no vitals taken for this visit.  Physical Examination:  General: Well developed, well nourished, NAD  HEENT: OP clear, mucus membranes moist  SKIN: warm, dry. No rashes. Neuro: No focal deficits  Musculoskeletal: Muscle strength 5/5 all ext  Psychiatric: Mood and affect normal  Neck: No JVD, no carotid bruits, no thyromegaly, no lymphadenopathy.  Lungs:Clear bilaterally, no wheezes, rhonci, crackles Cardiovascular: Regular rate and rhythm. No murmurs, gallops or rubs. Abdomen:Soft. Bowel sounds present. Non-tender.  Extremities: No lower extremity edema. Pulses are 2 + in the bilateral DP/PT.  EKG:  EKG is not *** ordered today. The ekg ordered today demonstrates    Recent Labs: 09/23/2023: BUN 10; Creatinine, Ser 0.96; Hemoglobin 13.2; Platelets 200; Potassium 5.0; Sodium 140   Lipid Pane Lipid Panel     Component Value Date/Time   CHOL 143 09/14/2021 0731   TRIG 129 09/14/2021 0731   HDL 45 09/14/2021 0731   CHOLHDL 3.2 09/14/2021 0731   LDLCALC 75 09/14/2021 0731   LABVLDL 23 09/14/2021 0731      Wt Readings from Last 3 Encounters:  09/24/23 74.8 kg  09/23/23 75.7 kg  09/02/23 75.3 kg    Assessment and Plan:   1. CAD with angina: No ischemia on stress test 01/30/23. Cardiac cath November 2024 with stable CAD, no focally obstructive lesions. *** ?  Chest pain. Continue ASA, statin and beta blocker.   2. Hyperlipidemia: Lipids followed in primary care. LDL near goal in May 2024. Continue statin.   3. HTN: BP is well controlled. Continue current therapy.   Disposition:   F/U with me in six months  Signed, Verne Carrow, MD 10/08/2023 1:42 PM    [facility] [address] [place], [place], [place]  [address] Phone: [phone]; Fax: [phone]

## 2023-10-09 ENCOUNTER — Ambulatory Visit: Payer: Medicare Other | Attending: Cardiovascular Disease | Admitting: Cardiovascular Disease

## 2023-10-09 ENCOUNTER — Encounter: Payer: Self-pay | Admitting: Cardiovascular Disease

## 2023-10-09 VITALS — BP 122/78 | HR 67 | Ht 67.0 in | Wt 165.4 lb

## 2023-10-09 DIAGNOSIS — E7800 Pure hypercholesterolemia, unspecified: Secondary | ICD-10-CM | POA: Insufficient documentation

## 2023-10-09 DIAGNOSIS — I10 Essential (primary) hypertension: Secondary | ICD-10-CM | POA: Diagnosis present

## 2023-10-09 DIAGNOSIS — I25118 Atherosclerotic heart disease of native coronary artery with other forms of angina pectoris: Secondary | ICD-10-CM | POA: Insufficient documentation

## 2023-10-09 NOTE — Patient Instructions (Signed)
Medication Instructions:  No changes *If you need a refill on your cardiac medications before your next appointment, please call your pharmacy*   Lab Work: none If you have labs (blood work) drawn today and your tests are completely normal, you will receive your results only by: MyChart Message (if you have MyChart) OR A paper copy in the mail If you have any lab test that is abnormal or we need to change your treatment, we will call you to review the results.   Testing/Procedures: none   Follow-Up: At [facility], you and your health needs are our priority.  As part of our continuing mission to provide you with exceptional heart care, we have created designated Provider Care Teams.  These Care Teams include your primary Cardiologist (physician) and Advanced Practice Providers (APPs -  Physician Assistants and Nurse Practitioners) who all work together to provide you with the care you need, when you need it.    Your next appointment:   6 month(s)  Provider:   Verne Carrow, MD

## 2024-03-12 ENCOUNTER — Other Ambulatory Visit: Payer: Self-pay | Admitting: Cardiovascular Disease

## 2024-03-27 ENCOUNTER — Telehealth: Payer: Self-pay | Admitting: Cardiovascular Disease

## 2024-03-27 NOTE — Telephone Encounter (Signed)
 Pt returning call

## 2024-03-27 NOTE — Telephone Encounter (Signed)
 Patient calling to see if he have any lbs that due. Please advise

## 2024-03-27 NOTE — Telephone Encounter (Signed)
 Left a message to call back.

## 2024-03-27 NOTE — Telephone Encounter (Signed)
 Spoke with patient and he would like to know do he need any labs before his appointment on 5/17 with Marlyse Single

## 2024-03-27 NOTE — Telephone Encounter (Signed)
 Returned call to pt.  He has been made aware to just come fasting to his appointment, 04/28/24, that way if Sean Schultz wanted to order lipids, he will be prepared.  He verbalized understanding and thanked me for the call back.

## 2024-04-27 DIAGNOSIS — E7800 Pure hypercholesterolemia, unspecified: Secondary | ICD-10-CM | POA: Insufficient documentation

## 2024-04-27 DIAGNOSIS — I10 Essential (primary) hypertension: Secondary | ICD-10-CM | POA: Insufficient documentation

## 2024-04-27 NOTE — Progress Notes (Signed)
 OFFICE NOTE:    Date:  04/28/2024  ID:  Sean VEAR Sean Schultz., DOB 03-Oct-1951, MRN [id number] PCP: Sharlyne Almarie Dux, MD  [facility] Providers Cardiologist:  Lonni Cash, MD       Patient Profile:  Coronary artery disease  S/p 2.75 x 38 mm DES to the mid RCA in 09/2016 MPI 01/30/2023: No ischemia or infarction, EF 58 LHC 09/24/2023: LAD proximal 40, mid 20, distal 40, D1 ostial 30; LCx proximal-mid 20; RCA mid and distal stent patent, distal 30 TTE 05/20/2017: EF 55-60, no RWMA Hypertension Hyperlipidemia       Discussed the use of AI scribe software for clinical note transcription with the patient, who gave verbal consent to proceed. History of Present Illness Sean Schultz. is a 73 y.o. male who returns for follow up of CAD. He was last seen by Dr. Cash in 09/2023.   He has experienced fluctuations in blood pressure. Last year, his Metoprolol  was increased to 37.5 mg twice daily and Irbesartan  to 150 mg twice daily. His insurance will not cover twice daily dosing. Therefore, he has been given 300 mg tablets and takes 1/2 twice daily. These are not scored and he has different sizes to take. He has had some recent low BPs. He experiences lightheadedness with this. He has sometimes had chest pain associated with it described tightness. He has some chest pain with exertion. He has had similar discomfort in the past. He underwent cardiac catheterization in 09/2023 for similar symptoms. If his BP is optimal, he does not seem to have chest pain. He notes some discomfort that feels more MSK in nature. Overall, he has not had any significant change or escalation in chest pain symptoms. His blood pressure has been running low since March or April 2025, with readings sometimes as low as 99/60 mmHg, causing dizziness and exhaustion. He also has chronic shortness of breath due to severe allergies, which has not changed recently. He is retired and stays active by  working at [employer] a couple of days a week. He has not noticed any significant changes in his diet or exercise routine recently.   ROS-See HPI        Results LABS Total cholesterol: 149 mg/dL (94/79/7975) Triglycerides: 167 mg/dL (94/79/7975) HDL: 46 mg/dL (94/79/7975) LDL: 76 mg/dL (94/79/7975) Hemoglobin: 13.9 g/dL (92/89/7975) Potassium: 4.4 mmol/L (05/22/2023) Creatinine: 1.0 mg/dL (92/89/7975)        Physical Exam:  VS:  BP 110/70   Pulse 90   Ht 5' 7 (1.702 m)   Wt 158 lb 3.2 oz (71.8 kg)   SpO2 96%   BMI 24.78 kg/m    Wt Readings from Last 3 Encounters:  04/28/24 158 lb 3.2 oz (71.8 kg)  10/09/23 165 lb 6.4 oz (75 kg)  09/24/23 165 lb (74.8 kg)    Constitutional:      Appearance: Healthy appearance. Not in distress.  Pulmonary:     Breath sounds: Normal breath sounds. No wheezing. No rales.  Cardiovascular:     Normal rate. Regular rhythm.     Murmurs: There is no murmur.  Edema:    Peripheral edema absent.  Skin:    General: Skin is warm and dry.        Assessment and Plan:    Assessment & Plan Primary hypertension He has had BP fluctuations that seem to coincide with possible larger doses of Irbesartan . He has to break a 300 mg tablet in half and the parts  are not equal. His insurance will not cover Irbesartan  150 mg twice daily. We had a very long discussion on how to best manage his BP. We discussed changing Irbesartan  to 300 mg once daily vs 150 mg once daily vs changing to Losartan 100 mg once daily. We also discussed adding Amlodipine to his regimen if Irbesartan  150 mg once daily is not enough. This could also serve as an antianginal drug. He is most comfortable with taking the Irbesartan  150 mg twice daily. Based upon what his insurance will cover, he opted to start on Irbesartan  150 mg once daily. After the visit, I was able to look up what [facility] would cover for Irbesartan . According to their website, Irbesartan  150 mg is listed as $16.50 for a  supply of 60 tablets. - Decrease Irbesartan  to 150 mg once daily  - Continue Metoprolol  tartrate 37.5 mg twice daily  - Arrange follow up in the HTN clinic in 2 weeks - I will reach out to the patient to see if he would like me to send a Rx to [facility] to pay out of pocket Coronary artery disease involving native coronary artery of native heart with angina pectoris (HCC) S/p DES to [facility] in 2017. Cardiac catheterization in 09/2023 with patent RCA stent and nonobstructive disease elsewhere. He still has chest pain at times that is overall stable. As noted, we could add Amlodipine if his BP is above target. He is already on beta-blocker Rx. Ranexa  has been mentioned in the past in the notes, but I do not think he has ever been placed on this. It is an option if his BP cannot tolerate increasing other antianginals. Overall, he seems to have less symptoms if he is not having issues with hypotension. - Continue ASA 81 mg once daily, metoprolol  tartrate 37.5 mg twice daily, Crestor  5 mg 5 times a week, NTG prn - Follow up 6 mos Pure hypercholesterolemia Managed by primary care. Goal LDL at least < 70.         Dispo:  Return in about 6 months (around 10/28/2024) for Routine Follow Up, w/ Dr. Verlin.  Signed, Glendia Ferrier, PA-C

## 2024-04-28 ENCOUNTER — Ambulatory Visit: Attending: Physician Assistant | Admitting: Physician Assistant

## 2024-04-28 ENCOUNTER — Encounter: Payer: Self-pay | Admitting: Physician Assistant

## 2024-04-28 ENCOUNTER — Other Ambulatory Visit (HOSPITAL_COMMUNITY): Payer: Self-pay

## 2024-04-28 ENCOUNTER — Telehealth: Payer: Self-pay

## 2024-04-28 VITALS — BP 110/70 | HR 90 | Ht 67.0 in | Wt 158.2 lb

## 2024-04-28 DIAGNOSIS — I2510 Atherosclerotic heart disease of native coronary artery without angina pectoris: Secondary | ICD-10-CM

## 2024-04-28 DIAGNOSIS — I10 Essential (primary) hypertension: Secondary | ICD-10-CM | POA: Diagnosis not present

## 2024-04-28 DIAGNOSIS — E7800 Pure hypercholesterolemia, unspecified: Secondary | ICD-10-CM | POA: Diagnosis present

## 2024-04-28 DIAGNOSIS — I25119 Atherosclerotic heart disease of native coronary artery with unspecified angina pectoris: Secondary | ICD-10-CM | POA: Insufficient documentation

## 2024-04-28 MED ORDER — IRBESARTAN 150 MG PO TABS: 150.0000 mg | ORAL_TABLET | Freq: Every day | ORAL | 3 refills | Status: DC

## 2024-04-28 NOTE — Patient Instructions (Signed)
 Medication Instructions:  Your physician has recommended you make the following change in your medication:   REDUCE Irbesartan  to 150 taking 1 daily  *If you need a refill on your cardiac medications before your next appointment, please call your pharmacy*  Lab Work: None ordered  If you have labs (blood work) drawn today and your tests are completely normal, you will receive your results only by: MyChart Message (if you have MyChart) OR A paper copy in the mail If you have any lab test that is abnormal or we need to change your treatment, we will call you to review the results.   You have been referred to See a Pharmacist re:  Hypertension Clinic Testing/Procedures: None ordered  Follow-Up: At [facility], you and your health needs are our priority.  As part of our continuing mission to provide you with exceptional heart care, our providers are all part of one team.  This team includes your primary Cardiologist (physician) and Advanced Practice Providers or APPs (Physician Assistants and Nurse Practitioners) who all work together to provide you with the care you need, when you need it.  Your next appointment:   6 month(s)  Provider:   Antoinette Batman, MD    We recommend signing up for the patient portal called MyChart.  Sign up information is provided on this After Visit Summary.  MyChart is used to connect with patients for Virtual Visits (Telemedicine).  Patients are able to view lab/test results, encounter notes, upcoming appointments, etc.  Non-urgent messages can be sent to your provider as well.   To learn more about what you can do with MyChart, go to [email or web address].   Other Instructions

## 2024-04-28 NOTE — Telephone Encounter (Signed)
 Pharmacy Patient Advocate Encounter  Insurance verification completed.   The patient is insured through [facility] MEDICARE   Ran test claim for IRBESARTAN  150. Currently a quantity of 60 is a 30 day supply and the co-pay is NA . PLAN LIMITATIONS EXCEEDED   This test claim was processed through [facility]- copay amounts may vary at other pharmacies due to pharmacy/plan contracts, or as the patient moves through the different stages of their insurance plan.

## 2024-04-28 NOTE — Assessment & Plan Note (Signed)
 S/p DES to [facility] in 2017. Cardiac catheterization in 09/2023 with patent RCA stent and nonobstructive disease elsewhere. He still has chest pain at times that is overall stable. As noted, we could add Amlodipine if his BP is above target. He is already on beta-blocker Rx. Ranexa  has been mentioned in the past in the notes, but I do not think he has ever been placed on this. It is an option if his BP cannot tolerate increasing other antianginals. Overall, he seems to have less symptoms if he is not having issues with hypotension. - Continue ASA 81 mg once daily, metoprolol  tartrate 37.5 mg twice daily, Crestor  5 mg 5 times a week, NTG prn - Follow up 6 mos

## 2024-04-28 NOTE — Assessment & Plan Note (Signed)
 He has had BP fluctuations that seem to coincide with possible larger doses of Irbesartan . He has to break a 300 mg tablet in half and the parts are not equal. His insurance will not cover Irbesartan  150 mg twice daily. We had a very long discussion on how to best manage his BP. We discussed changing Irbesartan  to 300 mg once daily vs 150 mg once daily vs changing to Losartan 100 mg once daily. We also discussed adding Amlodipine to his regimen if Irbesartan  150 mg once daily is not enough. This could also serve as an antianginal drug. He is most comfortable with taking the Irbesartan  150 mg twice daily. Based upon what his insurance will cover, he opted to start on Irbesartan  150 mg once daily. After the visit, I was able to look up what [facility] would cover for Irbesartan . According to their website, Irbesartan  150 mg is listed as $16.50 for a supply of 60 tablets. - Decrease Irbesartan  to 150 mg once daily  - Continue Metoprolol  tartrate 37.5 mg twice daily  - Arrange follow up in the HTN clinic in 2 weeks - I will reach out to the patient to see if he would like me to send a Rx to [facility] to pay out of pocket

## 2024-04-28 NOTE — Assessment & Plan Note (Signed)
 Managed by primary care. Goal LDL at least < 70.

## 2024-05-04 ENCOUNTER — Telehealth: Payer: Self-pay | Admitting: Cardiovascular Disease

## 2024-05-04 NOTE — Telephone Encounter (Signed)
 Pt c/o medication issue:  1. Name of Medication: irbesartan  (AVAPRO ) 150 MG tablet   2. How are you currently taking this medication (dosage and times per day)? As written   3. Are you having a reaction (difficulty breathing--STAT)? No   4. What is your medication issue? Pt states this medication is making his BP higher and would like to know if he should stop taking it.   His last three BP Readings: 147/82 144/87 147/84

## 2024-05-04 NOTE — Telephone Encounter (Signed)
 Pt called to let Scot know that his BP readings have been the last 3 days:   147/82 144/87 147/84  He is asking if Glendia thinks he needs to add another med... Glendia is not here today but will be here tomorrow I will route to him for his review,

## 2024-05-05 ENCOUNTER — Other Ambulatory Visit: Payer: Self-pay

## 2024-05-05 MED ORDER — IRBESARTAN 75 MG PO TABS: 75.0000 mg | ORAL_TABLET | Freq: Every morning | ORAL | 11 refills | Status: AC

## 2024-05-05 MED ORDER — IRBESARTAN 150 MG PO TABS: 150.0000 mg | ORAL_TABLET | Freq: Every evening | ORAL | Status: AC

## 2024-05-05 MED ORDER — IRBESARTAN 75 MG PO TABS: 75.0000 mg | ORAL_TABLET | Freq: Every morning | ORAL | 3 refills | Status: AC

## 2024-05-05 NOTE — Telephone Encounter (Signed)
 Left message for patient to callback. Also replied to MyChart message sent to [facility], PA-C.

## 2024-05-05 NOTE — Addendum Note (Signed)
 Addended byBETHA FERRIER, GLENDIA T on: 05/05/2024 04:46 PM   Modules accepted: Orders

## 2024-05-05 NOTE — Telephone Encounter (Signed)
 I have a MyChart message with him currently as well. Can you follow up with him and confirm that he is currently taking Irbesartan  150 mg in the evenings? He questioned if he could try an additional, lower dose of Irbesartan  in the mornings. I am ok with that. When he gets the Rx through insurance, they usually do not cover more than one dose per day, which has made it difficult to get a Rx filled for twice daily dosing. I offered to send the Rx to [facility] instead. When I looked it up, it appears he can pay out of pocket without using insurance and the cost should be around $16. If he wants to try this, would send Rx to [facility] for Irbesartan  75 mg once daily in the mornings. He can continue the Irbesartan  150 mg in the evenings along with this. Have him monitor his BP and let us  know if it remains > 140 systolic.  Glendia Ferrier, PA-C    05/05/2024 1:20 PM

## 2024-05-05 NOTE — Telephone Encounter (Signed)
 Called patient back about message. Patient called stated SBP 140's since irbesartan  150 mg daily. HR is within normal range. Patient stated his SBP needs to be below 130 per Dr. Verlin. Will send message to Glendia Ferrier PA, who has been seeing patient for advisement.

## 2024-05-05 NOTE — Telephone Encounter (Signed)
 Spoke with pt in the lobby and went over [facility], [place] recommendations of adding Irbesartan  75 mg every morning in addition to the 150 mg in the evening. Pt agreed to this plan. Prescription has been sent in to [facility] on [address] at pt request.

## 2024-05-05 NOTE — Telephone Encounter (Signed)
 Calling to get clarification on what he needs to do about the medication. He states the [employer] not clear. Please advise

## 2024-05-25 ENCOUNTER — Other Ambulatory Visit: Payer: Self-pay | Admitting: Cardiovascular Disease

## 2024-06-08 ENCOUNTER — Other Ambulatory Visit: Payer: Self-pay | Admitting: Cardiovascular Disease

## 2024-06-11 ENCOUNTER — Ambulatory Visit: Admitting: Pharmacist

## 2024-06-12 ENCOUNTER — Other Ambulatory Visit (HOSPITAL_COMMUNITY): Payer: Self-pay

## 2024-06-12 ENCOUNTER — Ambulatory Visit: Attending: Cardiology | Admitting: Pharmacist

## 2024-06-12 ENCOUNTER — Encounter: Payer: Self-pay | Admitting: Pharmacist

## 2024-06-12 VITALS — BP 120/69 | HR 69

## 2024-06-12 DIAGNOSIS — I10 Essential (primary) hypertension: Secondary | ICD-10-CM | POA: Diagnosis present

## 2024-06-12 NOTE — Patient Instructions (Signed)
 It was nice meeting you today  We would like your blood pressure to be less than 130/80  For now, continue your irbesartan  150mg  in the evening  Hold your 75mg  dose in the mornings  Continue to monitor your blood pressure at home  Send me a mychart message early next week with your home readings  If elevated, we can consider switching to the valsartan 40mg  twice a day  Chris Kemari Narez, PharmD, [place], CDCES, CPP [facility] [address], [place], [place] [address] Phone: [phone]; Fax: [phone] 06/12/2024 8:45 AM

## 2024-06-12 NOTE — Progress Notes (Signed)
 Patient ID: Sean Schultz.                 DOB: 02/06/1951                      MRN: [id number]     HPI: Sean Schultz. is a 73 y.o. male referred by Glendia Ferrier to HTN clinic. Patient of Dr Verlin. PMH is significant for CAD, HTN, CKD, and BPH.  Patient currently on irbesartan  150mg  in the evening and 75mg  in the morning. His insurance plan will not cover twice daily irbesartan  so he needs to use [employer] for the 75mg  strength.  Having hypotensive symptoms during the day since starting irbesartan  75mg . Dizziness around mid morning or late afternoon.    Recent BP readings: 104/68 104/59 102/60 108/65   Current HTN meds:  Irbesartan  75mg  in morning, 150mg  in evening  BP goal: <130/80   Wt Readings from Last 3 Encounters:  04/28/24 158 lb 3.2 oz (71.8 kg)  10/09/23 165 lb 6.4 oz (75 kg)  09/24/23 165 lb (74.8 kg)   BP Readings from Last 3 Encounters:  04/28/24 110/70  10/09/23 122/78  09/24/23 128/75   Pulse Readings from Last 3 Encounters:  04/28/24 90  10/09/23 67  09/24/23 61    Renal function: CrCl cannot be calculated (Patient's most recent lab result is older than the maximum 21 days allowed.).  Past Medical History:  Diagnosis Date   Arthritis    Asthma    seasonal asthma as a child   BPH (benign prostatic hypertrophy)    Coronary artery disease    stent 2017   GERD (gastroesophageal reflux disease)    H/O seasonal allergies    Hyperlipemia    Hypertension    Prostatitis     Current Outpatient Medications on File Prior to Visit  Medication Sig Dispense Refill   clotrimazole (LOTRIMIN) 1 % external solution 4 drops in  right      ear twice a day for 7 days for fungal infection.     gentamicin  (GARAMYCIN ) 0.3 % ophthalmic solution 4 drops.     Ascorbic Acid (SUPER C COMPLEX PO) Take 1 tablet by mouth daily.     aspirin  EC 81 MG tablet Take 81 mg by mouth every evening.      Calcium  Polycarbophil (FIBERCON PO) Take 4 tablets by mouth  daily.     cetirizine (ZYRTEC) 10 MG tablet Take 10 mg by mouth daily as needed for allergies.     Coenzyme Q10 (COQ-10 PO) Take 1 tablet by mouth daily.     Cyanocobalamin  (B-12 PO) Take 1 capsule by mouth daily.     fluticasone  (FLONASE ) 50 MCG/ACT nasal spray Place 1 spray into both nostrils daily as needed for allergies or rhinitis.     irbesartan  (AVAPRO ) 150 MG tablet Take 1 tablet (150 mg total) by mouth every evening.     irbesartan  (AVAPRO ) 75 MG tablet Take 1 tablet (75 mg total) by mouth every morning. 30 tablet 11   irbesartan  (AVAPRO ) 75 MG tablet Take 1 tablet (75 mg total) by mouth in the morning. 60 tablet 3   ketotifen  (ZADITOR ) 0.035 % ophthalmic solution Place 1 drop into both eyes 2 (two) times daily as needed (allergies).     loratadine  (CLARITIN ) 10 MG tablet Take 10 mg by mouth daily as needed for allergies.     Melatonin 5 MG CAPS Take 5 mg by mouth at bedtime as  needed (sleep).     metoprolol  tartrate (LOPRESSOR ) 25 MG tablet TAKE 1 AND 1/2 TABLET BY MOUTH TWICE A DAY 270 tablet 3   montelukast  (SINGULAIR ) 10 MG tablet Take 10 mg by mouth daily as needed (allergies).   1   NIACIN  PO Take 1 tablet by mouth daily.     nitroGLYCERIN  (NITROSTAT ) 0.4 MG SL tablet DISSOLVE 1 TAB UNDER TONGUE FOR CHEST PAIN - IF PAIN REMAINS AFTER 5 MIN, CALL 911 AND REPEAT DOSE. MAX 3 TABS IN 15 MINUTES 25 tablet 5   Omega-3 Fatty Acids (FISH OIL PO) Take 1 capsule by mouth daily.     OVER THE COUNTER MEDICATION Take 2 tablets by mouth every evening. Cholesterol complete     rosuvastatin  (CRESTOR ) 5 MG tablet TAKE 1 TABLET BY MOUTH 5 TIMES PER WEEK 60 tablet 3   No current facility-administered medications on file prior to visit.    Allergies  Allergen Reactions   Penicillins Swelling and Other (See Comments)    Dyspnea, hair loss-childhood allergy   Atorvastatin Calcium  Other (See Comments)    LFT increased    Rosuvastatin      In high doses caused levated LFT Joint muscle pain        Assessment/Plan:  1. Hypertension -  Patient BP in room today 120/69 which is at goal of <130/80. Hypotensive symptoms are concerning however. Had long discussion regarding possible morning medication changes but still being compliant with his insurance plan. Likely patient would benefit from irbesartan  75mg  BID. Will have patient hold his morning irbesartan  dose through the weekend and monitor BP. Patient will report readings back early next week. If patient needs twice daily ARB therapy, will consider valsartan 40mg  BID which is covered on his plan.  Continue irbesartan  150mg  in the evening Hold irbesartan  75mg  in morning Check BP through weekend and follow up via phone or mychart next week  Medford Bolk, PharmD, BCACP, CDCES, CPP [facility] [address] [place], [place] [address] Phone: [phone]; Fax: [phone] 06/12/2024 4:51 PM

## 2024-06-17 ENCOUNTER — Encounter: Payer: Self-pay | Admitting: Pharmacist

## 2024-06-27 ENCOUNTER — Other Ambulatory Visit: Payer: Self-pay | Admitting: Cardiovascular Disease
